# Patient Record
Sex: Female | Born: 1985 | Race: Black or African American | Hispanic: No | Marital: Married | Smoking: Never smoker
Health system: Southern US, Community
[De-identification: ages and names within clinical notes are randomized; demographics above are authoritative.]

## PROBLEM LIST (undated history)

## (undated) ENCOUNTER — Inpatient Hospital Stay (HOSPITAL_COMMUNITY): Payer: Self-pay

## (undated) DIAGNOSIS — D649 Anemia, unspecified: Secondary | ICD-10-CM

## (undated) DIAGNOSIS — Z970 Presence of artificial eye: Secondary | ICD-10-CM

## (undated) DIAGNOSIS — O24419 Gestational diabetes mellitus in pregnancy, unspecified control: Secondary | ICD-10-CM

## (undated) DIAGNOSIS — H269 Unspecified cataract: Secondary | ICD-10-CM

## (undated) DIAGNOSIS — J45909 Unspecified asthma, uncomplicated: Secondary | ICD-10-CM

## (undated) DIAGNOSIS — B977 Papillomavirus as the cause of diseases classified elsewhere: Secondary | ICD-10-CM

## (undated) DIAGNOSIS — H409 Unspecified glaucoma: Secondary | ICD-10-CM

## (undated) DIAGNOSIS — H548 Legal blindness, as defined in USA: Secondary | ICD-10-CM

## (undated) HISTORY — PX: DILATION AND CURETTAGE OF UTERUS: SHX78

## (undated) HISTORY — PX: EYE SURGERY: SHX253

## (undated) HISTORY — DX: Unspecified asthma, uncomplicated: J45.909

## (undated) HISTORY — DX: Gestational diabetes mellitus in pregnancy, unspecified control: O24.419

---

## 2005-10-20 ENCOUNTER — Inpatient Hospital Stay (HOSPITAL_COMMUNITY): Admission: AD | Admit: 2005-10-20 | Discharge: 2005-10-20 | Payer: Self-pay | Admitting: *Deleted

## 2005-10-30 ENCOUNTER — Inpatient Hospital Stay (HOSPITAL_COMMUNITY): Admission: RE | Admit: 2005-10-30 | Discharge: 2005-10-30 | Payer: Self-pay | Admitting: *Deleted

## 2005-11-14 ENCOUNTER — Inpatient Hospital Stay (HOSPITAL_COMMUNITY): Admission: AD | Admit: 2005-11-14 | Discharge: 2005-11-14 | Payer: Self-pay | Admitting: Obstetrics & Gynecology

## 2005-11-27 ENCOUNTER — Emergency Department (HOSPITAL_COMMUNITY): Admission: EM | Admit: 2005-11-27 | Discharge: 2005-11-27 | Payer: Self-pay | Admitting: Emergency Medicine

## 2005-12-24 ENCOUNTER — Ambulatory Visit (HOSPITAL_COMMUNITY): Admission: RE | Admit: 2005-12-24 | Discharge: 2005-12-24 | Payer: Self-pay | Admitting: *Deleted

## 2005-12-27 ENCOUNTER — Inpatient Hospital Stay (HOSPITAL_COMMUNITY): Admission: AD | Admit: 2005-12-27 | Discharge: 2005-12-27 | Payer: Self-pay | Admitting: *Deleted

## 2006-01-04 ENCOUNTER — Inpatient Hospital Stay (HOSPITAL_COMMUNITY): Admission: AD | Admit: 2006-01-04 | Discharge: 2006-01-04 | Payer: Self-pay | Admitting: Obstetrics & Gynecology

## 2006-01-08 ENCOUNTER — Other Ambulatory Visit: Admission: RE | Admit: 2006-01-08 | Discharge: 2006-01-08 | Payer: Self-pay | Admitting: Obstetrics and Gynecology

## 2006-02-20 ENCOUNTER — Inpatient Hospital Stay (HOSPITAL_COMMUNITY): Admission: AD | Admit: 2006-02-20 | Discharge: 2006-02-20 | Payer: Self-pay | Admitting: Obstetrics and Gynecology

## 2006-03-11 ENCOUNTER — Inpatient Hospital Stay (HOSPITAL_COMMUNITY): Admission: AD | Admit: 2006-03-11 | Discharge: 2006-03-11 | Payer: Self-pay | Admitting: Obstetrics and Gynecology

## 2007-10-12 ENCOUNTER — Emergency Department: Payer: Self-pay | Admitting: Emergency Medicine

## 2010-07-28 ENCOUNTER — Ambulatory Visit: Payer: Self-pay | Admitting: Family Medicine

## 2011-11-18 ENCOUNTER — Ambulatory Visit: Payer: Self-pay

## 2012-02-21 ENCOUNTER — Ambulatory Visit: Payer: Self-pay

## 2012-02-21 LAB — URINALYSIS, COMPLETE
Bacteria: NEGATIVE
Bilirubin,UR: NEGATIVE
Glucose,UR: NEGATIVE mg/dL (ref 0–75)
Ketone: NEGATIVE
Nitrite: NEGATIVE
Ph: 8 (ref 4.5–8.0)
Protein: NEGATIVE
Specific Gravity: 1.02 (ref 1.003–1.030)

## 2012-02-21 LAB — PREGNANCY, URINE: Pregnancy Test, Urine: NEGATIVE m[IU]/mL

## 2012-02-23 LAB — URINE CULTURE

## 2012-05-06 ENCOUNTER — Ambulatory Visit: Payer: Self-pay | Admitting: Family Medicine

## 2012-05-16 ENCOUNTER — Emergency Department: Payer: Self-pay | Admitting: Emergency Medicine

## 2012-05-16 LAB — URINALYSIS, COMPLETE
Bilirubin,UR: NEGATIVE
Glucose,UR: NEGATIVE mg/dL (ref 0–75)
Ketone: NEGATIVE
Leukocyte Esterase: NEGATIVE
Nitrite: NEGATIVE
Ph: 8 (ref 4.5–8.0)
Protein: NEGATIVE
RBC,UR: 4 /HPF (ref 0–5)
Specific Gravity: 1.011 (ref 1.003–1.030)
Squamous Epithelial: 1
WBC UR: <1 /HPF (ref 0–5)

## 2012-05-16 LAB — HCG, QUANTITATIVE, PREGNANCY: Beta Hcg, Quant.: 92317 m[IU]/mL — ABNORMAL HIGH

## 2012-05-16 LAB — CBC
HCT: 36.1 % (ref 35.0–47.0)
HGB: 12 g/dL (ref 12.0–16.0)
MCH: 29.7 pg (ref 26.0–34.0)
MCHC: 33.3 g/dL (ref 32.0–36.0)
MCV: 89 fL (ref 80–100)
Platelet: 277 10*3/uL (ref 150–440)
RBC: 4.05 10*6/uL (ref 3.80–5.20)
RDW: 13.6 % (ref 11.5–14.5)
WBC: 7.8 10*3/uL (ref 3.6–11.0)

## 2012-08-04 ENCOUNTER — Observation Stay: Payer: Self-pay

## 2012-08-04 LAB — URINALYSIS, COMPLETE
Bilirubin,UR: NEGATIVE
Glucose,UR: NEGATIVE mg/dL (ref 0–75)
Ketone: NEGATIVE
Leukocyte Esterase: NEGATIVE
Nitrite: NEGATIVE
Ph: 7 (ref 4.5–8.0)
Protein: NEGATIVE
RBC,UR: 29 /HPF (ref 0–5)
Specific Gravity: 1.017 (ref 1.003–1.030)
Squamous Epithelial: 3
WBC UR: 2 /HPF (ref 0–5)

## 2012-08-05 LAB — URINE CULTURE

## 2012-10-11 ENCOUNTER — Observation Stay: Payer: Self-pay | Admitting: Obstetrics and Gynecology

## 2012-10-11 LAB — URINALYSIS, COMPLETE
Bilirubin,UR: NEGATIVE
Glucose,UR: NEGATIVE mg/dL (ref 0–75)
Ketone: NEGATIVE
Leukocyte Esterase: NEGATIVE
Nitrite: NEGATIVE
Ph: 7 (ref 4.5–8.0)
Protein: NEGATIVE
RBC,UR: 4 /HPF (ref 0–5)
Specific Gravity: 1.005 (ref 1.003–1.030)
Squamous Epithelial: 1
WBC UR: 2 /HPF (ref 0–5)

## 2012-10-11 LAB — FETAL FIBRONECTIN
Appearance: NORMAL
Fetal Fibronectin: NEGATIVE

## 2012-11-01 ENCOUNTER — Observation Stay: Payer: Self-pay

## 2012-11-01 LAB — URINALYSIS, COMPLETE
Bilirubin,UR: NEGATIVE
Glucose,UR: NEGATIVE mg/dL (ref 0–75)
Leukocyte Esterase: NEGATIVE
Nitrite: NEGATIVE
Ph: 7 (ref 4.5–8.0)
Protein: NEGATIVE
RBC,UR: 5 /HPF (ref 0–5)
Specific Gravity: 1.008 (ref 1.003–1.030)
Squamous Epithelial: 3
Transitional Epi: <1
WBC UR: 2 /HPF (ref 0–5)

## 2012-11-02 LAB — URINE CULTURE

## 2012-12-02 ENCOUNTER — Observation Stay: Payer: Self-pay | Admitting: Obstetrics and Gynecology

## 2012-12-10 ENCOUNTER — Inpatient Hospital Stay: Payer: Self-pay

## 2012-12-10 LAB — CBC WITH DIFFERENTIAL/PLATELET
Basophil #: 0 10*3/uL (ref 0.0–0.1)
Basophil %: 0.3 %
Eosinophil #: 0.1 10*3/uL (ref 0.0–0.7)
Eosinophil %: 1.1 %
HCT: 32.6 % — ABNORMAL LOW (ref 35.0–47.0)
HGB: 10.5 g/dL — ABNORMAL LOW (ref 12.0–16.0)
Lymphocyte #: 1.3 10*3/uL (ref 1.0–3.6)
Lymphocyte %: 14.4 %
MCH: 27.8 pg (ref 26.0–34.0)
MCHC: 32.2 g/dL (ref 32.0–36.0)
MCV: 86 fL (ref 80–100)
Monocyte #: 0.9 x10 3/mm (ref 0.2–0.9)
Monocyte %: 9.8 %
Neutrophil #: 6.5 10*3/uL (ref 1.4–6.5)
Neutrophil %: 74.4 %
Platelet: 276 10*3/uL (ref 150–440)
RBC: 3.77 10*6/uL — ABNORMAL LOW (ref 3.80–5.20)
RDW: 16.2 % — ABNORMAL HIGH (ref 11.5–14.5)
WBC: 8.8 10*3/uL (ref 3.6–11.0)

## 2012-12-12 LAB — HEMATOCRIT: HCT: 29.7 % — ABNORMAL LOW (ref 35.0–47.0)

## 2013-02-07 ENCOUNTER — Emergency Department: Payer: Self-pay | Admitting: Emergency Medicine

## 2013-02-07 LAB — CBC
HCT: 38.9 % (ref 35.0–47.0)
HGB: 12.7 g/dL (ref 12.0–16.0)
MCH: 28.6 pg (ref 26.0–34.0)
MCHC: 32.8 g/dL (ref 32.0–36.0)
MCV: 87 fL (ref 80–100)
Platelet: 345 10*3/uL (ref 150–440)
RBC: 4.46 10*6/uL (ref 3.80–5.20)
RDW: 16.6 % — ABNORMAL HIGH (ref 11.5–14.5)
WBC: 8.1 10*3/uL (ref 3.6–11.0)

## 2013-02-07 LAB — COMPREHENSIVE METABOLIC PANEL
Albumin: 3.8 g/dL (ref 3.4–5.0)
Alkaline Phosphatase: 110 U/L (ref 50–136)
Anion Gap: 7 (ref 7–16)
BUN: 10 mg/dL (ref 7–18)
Bilirubin,Total: 0.2 mg/dL (ref 0.2–1.0)
Calcium, Total: 9.4 mg/dL (ref 8.5–10.1)
Chloride: 104 mmol/L (ref 98–107)
Co2: 30 mmol/L (ref 21–32)
Creatinine: 0.68 mg/dL (ref 0.60–1.30)
EGFR (African American): >60
EGFR (Non-African Amer.): >60
Glucose: 110 mg/dL — ABNORMAL HIGH (ref 65–99)
Osmolality: 281 (ref 275–301)
Potassium: 3.9 mmol/L (ref 3.5–5.1)
SGOT(AST): 21 U/L (ref 15–37)
SGPT (ALT): 30 U/L (ref 12–78)
Sodium: 141 mmol/L (ref 136–145)
Total Protein: 8.2 g/dL (ref 6.4–8.2)

## 2013-02-08 LAB — CK TOTAL AND CKMB (NOT AT ARMC)
CK, Total: 124 U/L (ref 21–215)
CK-MB: <0.5 ng/mL — ABNORMAL LOW (ref 0.5–3.6)

## 2013-02-08 LAB — TROPONIN I: Troponin-I: <0.02 ng/mL

## 2013-08-26 ENCOUNTER — Emergency Department: Payer: Self-pay | Admitting: Emergency Medicine

## 2013-08-26 LAB — CBC WITH DIFFERENTIAL/PLATELET
Basophil #: 0 10*3/uL (ref 0.0–0.1)
Basophil %: 0.4 %
Eosinophil #: 0.3 10*3/uL (ref 0.0–0.7)
Eosinophil %: 3.9 %
HCT: 37.7 % (ref 35.0–47.0)
HGB: 12.7 g/dL (ref 12.0–16.0)
Lymphocyte #: 2.2 10*3/uL (ref 1.0–3.6)
Lymphocyte %: 28.7 %
MCH: 29.4 pg (ref 26.0–34.0)
MCHC: 33.6 g/dL (ref 32.0–36.0)
MCV: 88 fL (ref 80–100)
Monocyte #: 0.6 x10 3/mm (ref 0.2–0.9)
Monocyte %: 8.1 %
Neutrophil #: 4.4 10*3/uL (ref 1.4–6.5)
Neutrophil %: 58.9 %
Platelet: 307 10*3/uL (ref 150–440)
RBC: 4.31 10*6/uL (ref 3.80–5.20)
RDW: 13.7 % (ref 11.5–14.5)
WBC: 7.5 10*3/uL (ref 3.6–11.0)

## 2013-08-26 LAB — COMPREHENSIVE METABOLIC PANEL
Albumin: 3.7 g/dL (ref 3.4–5.0)
Alkaline Phosphatase: 93 U/L (ref 50–136)
Anion Gap: 4 — ABNORMAL LOW (ref 7–16)
BUN: 16 mg/dL (ref 7–18)
Bilirubin,Total: 0.2 mg/dL (ref 0.2–1.0)
Calcium, Total: 9.3 mg/dL (ref 8.5–10.1)
Chloride: 103 mmol/L (ref 98–107)
Co2: 30 mmol/L (ref 21–32)
Creatinine: 0.72 mg/dL (ref 0.60–1.30)
EGFR (African American): >60
EGFR (Non-African Amer.): >60
Glucose: 96 mg/dL (ref 65–99)
Osmolality: 275 (ref 275–301)
Potassium: 3.7 mmol/L (ref 3.5–5.1)
SGOT(AST): 18 U/L (ref 15–37)
SGPT (ALT): 20 U/L (ref 12–78)
Sodium: 137 mmol/L (ref 136–145)
Total Protein: 7.8 g/dL (ref 6.4–8.2)

## 2013-08-26 LAB — URINALYSIS, COMPLETE
Bilirubin,UR: NEGATIVE
Glucose,UR: NEGATIVE mg/dL (ref 0–75)
Ketone: NEGATIVE
Nitrite: NEGATIVE
Ph: 7 (ref 4.5–8.0)
Protein: NEGATIVE
RBC,UR: 16 /HPF (ref 0–5)
Specific Gravity: 1.02 (ref 1.003–1.030)
Squamous Epithelial: 7
WBC UR: 2 /HPF (ref 0–5)

## 2013-08-26 LAB — TSH: Thyroid Stimulating Horm: 2.96 u[IU]/mL

## 2013-08-26 LAB — PREGNANCY, URINE: Pregnancy Test, Urine: NEGATIVE m[IU]/mL

## 2013-08-26 LAB — T4, FREE: Free Thyroxine: 1 ng/dL (ref 0.76–1.46)

## 2013-09-07 ENCOUNTER — Ambulatory Visit: Payer: Self-pay | Admitting: Family Medicine

## 2013-11-24 ENCOUNTER — Ambulatory Visit: Payer: Self-pay

## 2013-12-24 ENCOUNTER — Ambulatory Visit: Payer: Self-pay | Admitting: Physician Assistant

## 2013-12-24 LAB — COMPREHENSIVE METABOLIC PANEL
Albumin: 3.1 g/dL — ABNORMAL LOW (ref 3.4–5.0)
Alkaline Phosphatase: 47 U/L
Anion Gap: 12 (ref 7–16)
BUN: 7 mg/dL (ref 7–18)
Bilirubin,Total: 0.4 mg/dL (ref 0.2–1.0)
Calcium, Total: 9.3 mg/dL (ref 8.5–10.1)
Chloride: 100 mmol/L (ref 98–107)
Co2: 24 mmol/L (ref 21–32)
Creatinine: 0.56 mg/dL — ABNORMAL LOW (ref 0.60–1.30)
EGFR (African American): >60
EGFR (Non-African Amer.): >60
Glucose: 86 mg/dL (ref 65–99)
Osmolality: 269 (ref 275–301)
Potassium: 3.7 mmol/L (ref 3.5–5.1)
SGOT(AST): 13 U/L — ABNORMAL LOW (ref 15–37)
SGPT (ALT): 16 U/L (ref 12–78)
Sodium: 136 mmol/L (ref 136–145)
Total Protein: 7.8 g/dL (ref 6.4–8.2)

## 2013-12-24 LAB — URINALYSIS, COMPLETE
Bilirubin,UR: NEGATIVE
Glucose,UR: NEGATIVE mg/dL (ref 0–75)
Nitrite: NEGATIVE
Ph: 6.5 (ref 4.5–8.0)
Specific Gravity: 1.02 (ref 1.003–1.030)

## 2013-12-24 LAB — CBC WITH DIFFERENTIAL/PLATELET
Basophil #: 0 10*3/uL (ref 0.0–0.1)
Basophil %: 0.3 %
Eosinophil #: 0.1 10*3/uL (ref 0.0–0.7)
Eosinophil %: 1.4 %
HCT: 37.6 % (ref 35.0–47.0)
HGB: 12.6 g/dL (ref 12.0–16.0)
Lymphocyte #: 0.8 10*3/uL — ABNORMAL LOW (ref 1.0–3.6)
Lymphocyte %: 13.3 %
MCH: 29.6 pg (ref 26.0–34.0)
MCHC: 33.5 g/dL (ref 32.0–36.0)
MCV: 88 fL (ref 80–100)
Monocyte #: 0.4 x10 3/mm (ref 0.2–0.9)
Monocyte %: 7 %
Neutrophil #: 4.8 10*3/uL (ref 1.4–6.5)
Neutrophil %: 78 %
Platelet: 280 10*3/uL (ref 150–440)
RBC: 4.25 10*6/uL (ref 3.80–5.20)
RDW: 13.8 % (ref 11.5–14.5)
WBC: 6.2 10*3/uL (ref 3.6–11.0)

## 2013-12-26 LAB — URINE CULTURE

## 2014-04-29 ENCOUNTER — Observation Stay: Payer: Self-pay

## 2014-06-13 ENCOUNTER — Observation Stay: Payer: Self-pay | Admitting: Obstetrics and Gynecology

## 2014-06-13 LAB — URINALYSIS, COMPLETE
Bilirubin,UR: NEGATIVE
Glucose,UR: NEGATIVE mg/dL (ref 0–75)
Ketone: NEGATIVE
Nitrite: NEGATIVE
Ph: 7 (ref 4.5–8.0)
Protein: NEGATIVE
RBC,UR: 7 /HPF (ref 0–5)
Specific Gravity: 1.013 (ref 1.003–1.030)
Squamous Epithelial: 30
WBC UR: 17 /HPF (ref 0–5)

## 2014-06-25 ENCOUNTER — Observation Stay: Payer: Self-pay | Admitting: Obstetrics and Gynecology

## 2014-07-07 ENCOUNTER — Inpatient Hospital Stay: Payer: Self-pay

## 2014-07-07 LAB — CBC WITH DIFFERENTIAL/PLATELET
Basophil #: 0 10*3/uL (ref 0.0–0.1)
Basophil %: 0.4 %
Eosinophil #: 0.1 10*3/uL (ref 0.0–0.7)
Eosinophil %: 0.9 %
HCT: 36 % (ref 35.0–47.0)
HGB: 11.5 g/dL — ABNORMAL LOW (ref 12.0–16.0)
Lymphocyte #: 1.2 10*3/uL (ref 1.0–3.6)
Lymphocyte %: 14.2 %
MCH: 28.4 pg (ref 26.0–34.0)
MCHC: 32 g/dL (ref 32.0–36.0)
MCV: 89 fL (ref 80–100)
Monocyte #: 0.8 x10 3/mm (ref 0.2–0.9)
Monocyte %: 9.9 %
Neutrophil #: 6.3 10*3/uL (ref 1.4–6.5)
Neutrophil %: 74.6 %
Platelet: 230 10*3/uL (ref 150–440)
RBC: 4.06 10*6/uL (ref 3.80–5.20)
RDW: 16.2 % — ABNORMAL HIGH (ref 11.5–14.5)
WBC: 8.4 10*3/uL (ref 3.6–11.0)

## 2014-07-08 LAB — HEMATOCRIT: HCT: 31.5 % — ABNORMAL LOW (ref 35.0–47.0)

## 2015-04-30 NOTE — H&P (Signed)
L&D Evaluation:  History Expanded:   HPI 29yo at 31 weeks who was seen in office today and she now has pain in her back that started at 3pm she is not bleeding or leaking she has a history of kidney stones. no lab work has been forwarded to the hospital so labs are unknown and wi fi has been done for hiopsital per IT. no need for theses at gthis time,.    Gravida 1    Term 0    PreTerm 0    Abortion 0    Living 0    EDC 12-Dec-2012    Presents with back pain    Patient's Medical History No Chronic Illness    Patient's Surgical History none    Medications Pre [place]atal Vitamins    Social History none    Family History Non-Contributory   ROS:   ROS All systems were reviewed.  HEENT, CNS, GI, GU, Respiratory, CV, Renal and Musculoskeletal systems were found to be normal.   Exam:   Vital Signs stable    Urine Protein blood 2+,which is always there    General no apparent distress    Mental Status clear    Chest clear    Heart normal sinus rhythm   Impression:   Impression back piaj   Plan:   Comments having a few contractions on the monitor and she has blood in her urine which she always has, she will need to have IVF and FFn and then see if this changes,    Follow Up Appointment need to schedule   Electronic Signatures: Adria DevonKlett, Amine Adelson (MD)  (Signed 22-Oct-13 20:40)  Authored: L&D Evaluation   Last Updated: 22-Oct-13 20:40 by Adria DevonKlett, Donya Hitch (MD)

## 2015-04-30 NOTE — H&P (Signed)
L&D Evaluation:  History:   HPI 29 year old BF G4 P1021 with EDC=12/12/2012 by a 8wk4d ultrasound presents at 721 3/7 weeks with c/o right flank pain yesterday afternoon, that resolved until this AM at 0430 when she also began having urinary frequency. Reports having dysuria intermittently since yesterday. Denies fever, chills, contractions,or hematuria. Has not taken anything for pain or dysuria. Has had GBS bacteruria with this pregnancy and was treated with Macrobid. In June patient presented with UTI symptoms, and was treated for monilia and possible UTI with Diflucan and Amoxicillin, but urine culture was <10,000col/ml. She has a hx of frequent UTIs, urolithiasis , and microscopic hematuria. She [facility] her care at [facility] and [facility]. Had an early elevated one hr GTT, but the 3 hr GTT was normal.    Presents with dysuria, frequency, and right flank pain.    Patient's Medical History UTIs, urolithiasis, glaucoma, GDM with first pregnancy.    Patient's Surgical History right eye surgery 2004    Medications Pre [place]atal Vitamins  eye gtts    Allergies NKDA    Social History none    Family History Non-Contributory   ROS:   ROS see HPI   Exam:   Vital Signs stable  126/59    General no apparent distress    Mental Status clear    Abdomen uterus NT, mild SP tenderness    Back +R CVAT, tenderness in right SI area    Edema no edema    Mebranes Intact    FHT 150s    Ucx absent    Skin dry   Impression:   Impression IUP at 21 3/7 weeks with right flank pain, dysuria, and frequency. R/O pyelo   Plan:   Plan UA, urine C&S, po fluids, contraction monitor   Electronic Signatures: Trinna BalloonGutierrez, Kelee Cunningham L (CNM)  (Signed 15-Aug-13 07:54)  Authored: L&D Evaluation   Last Updated: 15-Aug-13 07:54 by Trinna BalloonGutierrez, Gaylen Pereira L (CNM)

## 2015-04-30 NOTE — H&P (Signed)
L&D Evaluation:  History Expanded:   HPI 29 year old BF G4 P1021 with EDC=12/12/2012 by a 8wk4d ultrasound presents at 9139 5/7  weeks with c/o mild contractions and prior pregnancy h/o Induction of labor for oligohydramnios.  She started her care at [facility] and [facility] in her ealy second trimester. Had an early elevated one hr GTT, but the 3 hr GTT was normal. A repeat 3hr GTT at 28 weeks was done with only one elevated value (2hr). Hx of an SVD at 38 weeks after having threatened preterm labor. Her labor was induced for oligo.    Presents with contractions    Patient's Medical History UTIs, urolithiasis, glaucoma, GDM with first pregnancy.    Patient's Surgical History right eye surgery 2004    Medications Pre [place]atal Vitamins  eye gtts    Allergies NKDA    Social History none    Family History Non-Contributory   ROS:   ROS see HPI   Exam:   Vital Signs stable    General no apparent distress    Mental Status clear    Chest clear    Heart normal sinus rhythm    Abdomen gravid, tender with contractions, cephalic    Estimated Fetal Weight Average for gestational age    Fetal Position cephalic    Back no CVAT    Edema no edema    Pelvic no external lesions, 1-2/80    Mebranes Intact    FHT normal rate with no decels    Ucx irregular    Skin dry    Other YS- Vtx. AFI <3.   Impression:   Impression 39 5/7 weeks Oligo and early labor.  Needs Augmentation if labor does not progress.   Plan:   Plan EFM/NST, monitor contractions and for cervical change, fluids    Comments Risks of oligohydramnios discussed with patient.   Electronic Signatures: Sylvia Thompson, Robert Paul (MD)  (Signed 21-Dec-13 22:46)  Authored: L&D Evaluation   Last Updated: 21-Dec-13 22:46 by Sylvia Thompson, Robert Paul (MD)

## 2015-04-30 NOTE — H&P (Signed)
L&D Evaluation:  History:   HPI 29 year old BF G4 P1021 with EDC=12/12/2012 by a 8wk4d ultrasound presents at 6234 1/7  weeks with c/o the "baby balling up" frequently since the weekend and dysuria x 2 days. She felt the contractions over the weekend, but they got harder and more frequent last night. The contractions decreased after a shower this AM but still came q6-7 minutes. She was seen for flank pain and dysuria about 2-3 weeks ago and the dysuria resolved after treating pt for monilia, but the dysuria returned 2 days ago. She had a gush of fluid today around 11AM and saw a streak of blood on the toliet tissue after that. Denies fever, chills, back pain. Baby active.Has had GBS bacteruria with this pregnancy. In June patient presented with UTI symptoms, and was treated for monilia and possible UTI with Diflucan and Amoxicillin, but urine culture was <10,000col/ml. She has a hx of frequent UTIs, urolithiasis , and microscopic hematuria. She was seen in August and Oct in L&D with flank pain/urolithiasis symptoms. She started her care at [facility] and [facility]. Had an early elevated one hr GTT, but the 3 hr GTT was normal. A repeat 3hr GTT at 28 weeks was done with only one elevated value (2hr). Hx of an SVD at 38 weeks after having threatened preterm labor. Her labor was induced for oligo.    Presents with contractions, dysuria, leaking fluid    Patient's Medical History UTIs, urolithiasis, glaucoma, GDM with first pregnancy.    Patient's Surgical History right eye surgery 2004    Medications Pre [place]atal Vitamins  eye gtts    Allergies NKDA    Social History none    Family History Non-Contributory   ROS:   ROS see HPI   Exam:   Vital Signs stable    Urine Protein negative dipstick, UA: hazy, 1.008, 5RBC/HPF, 2WBC/HPF, +1 bacteria, 3epis    General no apparent distress    Abdomen gravid, tender with contractions, cephalic. mild contraction  palpated    Fetal Position cephalic    Pelvic no external lesions, wet prep negative. SSE: negative pooling, fern, or Nitrazine. cx: closed/70%/-2    Mebranes Intact    FHT normal rate with no decels    Ucx q4-5 min apart    Skin dry   Impression:   Impression IUP at 34 1/7 weeks with threatened preterm labor. No  evidence of SROM.Marland Kitchen. Possible UTI   Plan:   Plan monitor contractions and for cervical change, urine culture. Start Keflex 500 mgm tid. One dose terbutaline subcutaneously   Electronic Signatures: Trinna BalloonGutierrez, Atlantis Delong L (CNM)  (Signed [id number]-Nov-13 15:04)  Authored: L&D Evaluation   Last Updated: 12-Nov-13 15:04 by Trinna BalloonGutierrez, Saleha Kalp L (CNM)

## 2015-04-30 NOTE — H&P (Signed)
L&D Evaluation:  History:  HPI 29 y/o G5P2022 @ 40/3days EDC 07/04/14 arrives with regualr conractions beginning this am and mucusy discharge. Denies leakign flid or bloody show. Baby is active. Care @ [facility] HX abn pap, GDM 1st pregnancy. GBS+.   Presents with contractions   Patient's Medical History No Chronic Illness   Patient's Surgical History right eye surgery 2004   Medications Pre [place] Vitamins   Allergies NKDA   Social History none   Family History Non-Contributory   ROS:  ROS All systems were reviewed.  HEENT, CNS, GI, GU, Respiratory, CV, Renal and Musculoskeletal systems were found to be normal.   Exam:  Vital Signs stable   Urine Protein not completed   General no apparent distress   Mental Status clear   Chest clear   Heart normal sinus rhythm   Abdomen gravid, non-tender   Estimated Fetal Weight Average for gestational age   Fetal Position vtx   Fundal Height term   Back no CVAT   Edema 1+   Reflexes 1+   Clonus negative   Pelvic no external lesions, 5cm vtx @ -1 BBOW sm show   Mebranes Intact   FHT normal rate with no decels, baseline 130's 140's avg variability with accels   Fetal Heart Rate 144   Ucx irregular, Q 3/5 mins 60 sec moderate   Skin dry   Lymph no lymphadenopathy   Impression:  Impression early labor   Plan:  Plan EFM/NST, monitor contractions and for cervical change, antibiotics for GBBS prophylaxis   Comments Admitted, knows what to expect. IV ABX begun. Plans epidural with labor progress. Family at bedside, supportive.   Electronic Signatures: Albertina ParrLugiano, Felecity Lemaster B (CNM)  (Signed 18-Jul-15 13:37)  Authored: L&D Evaluation   Last Updated: 18-Jul-15 13:37 by Albertina ParrLugiano, Christofer Shen B (CNM)

## 2015-09-29 ENCOUNTER — Emergency Department (HOSPITAL_COMMUNITY)
Admission: EM | Admit: 2015-09-29 | Discharge: 2015-09-29 | Disposition: A | Discharge status: Home / Self Care | Payer: Medicaid Other | Attending: Emergency Medicine | Admitting: Emergency Medicine

## 2015-09-29 ENCOUNTER — Encounter (HOSPITAL_COMMUNITY): Payer: Self-pay | Admitting: Emergency Medicine

## 2015-09-29 DIAGNOSIS — Z79899 Other long term (current) drug therapy: Secondary | ICD-10-CM | POA: Diagnosis not present

## 2015-09-29 DIAGNOSIS — H59092 Other disorders of the left eye following cataract surgery: Secondary | ICD-10-CM | POA: Diagnosis not present

## 2015-09-29 DIAGNOSIS — H11432 Conjunctival hyperemia, left eye: Secondary | ICD-10-CM | POA: Insufficient documentation

## 2015-09-29 DIAGNOSIS — H538 Other visual disturbances: Secondary | ICD-10-CM | POA: Insufficient documentation

## 2015-09-29 DIAGNOSIS — H548 Legal blindness, as defined in USA: Secondary | ICD-10-CM | POA: Diagnosis not present

## 2015-09-29 DIAGNOSIS — H1132 Conjunctival hemorrhage, left eye: Secondary | ICD-10-CM | POA: Diagnosis present

## 2015-09-29 DIAGNOSIS — Z9842 Cataract extraction status, left eye: Secondary | ICD-10-CM | POA: Insufficient documentation

## 2015-09-29 DIAGNOSIS — H40052 Ocular hypertension, left eye: Secondary | ICD-10-CM

## 2015-09-29 DIAGNOSIS — H4089 Other specified glaucoma: Secondary | ICD-10-CM | POA: Diagnosis not present

## 2015-09-29 HISTORY — DX: Unspecified glaucoma: H40.9

## 2015-09-29 HISTORY — DX: Legal blindness, as defined in USA: H54.8

## 2015-09-29 HISTORY — DX: Unspecified cataract: H26.9

## 2015-09-29 HISTORY — DX: Presence of artificial eye: Z97.0

## 2015-09-29 MED ORDER — TETRACAINE HCL 0.5 % OP SOLN
2.0000 [drp] | Freq: Once | OPHTHALMIC | Status: AC
  Administered 2015-09-29: 2 [drp] via OPHTHALMIC
  Filled 2015-09-29: qty 2

## 2015-09-29 MED ORDER — LATANOPROST 0.005 % OP SOLN
1.0000 [drp] | Freq: Every day | OPHTHALMIC | Status: DC
  Administered 2015-09-29: 1 [drp] via OPHTHALMIC
  Filled 2015-09-29: qty 2.5

## 2015-09-29 MED ORDER — FLUORESCEIN SODIUM 1 MG OP STRP
1.0000 | ORAL_STRIP | Freq: Once | OPHTHALMIC | Status: AC
  Administered 2015-09-29: 1 via OPHTHALMIC
  Filled 2015-09-29: qty 1

## 2015-09-29 NOTE — Discharge Instructions (Signed)
1. Medications: Add Xalatan 1 drop in the left eye at bedtime every day to your eye drop regimen, usual home medications 2. Treatment: rest, drink plenty of fluids,  3. Follow Up: Please followup with [facility] for a pressure check in 2 days; if you are unable to do so, please follow-up with Dr. Randon Goldsmith at [facility]; Please return to the ER for changes in vision, pain in your eye, fevers or other concerns

## 2015-09-29 NOTE — ED Provider Notes (Signed)
CSN: [id number]     Arrival date & time 09/29/15  1742 History  By signing my name below, I, Phillis Haggis, attest that this documentation has been prepared under the direction and in the presence of Dierdre Forth,, PA-C. Electronically Signed: Phillis Haggis, ED Scribe. 09/29/2015. 6:52 PM.   Chief Complaint  Patient presents with  . Eye Problem   The history is provided by the patient. No language interpreter was used.  HPI Comments: Sylvia Thompson is a 29 y.o. 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 She states that it feels like her eye pressure is up, for which she uses eye drops. The pressure and blurred vision are not new per the patient, just the blood in her eye. Pt reports she has has no changes in her vision today.  She states that she was followed at [facility], but has been referred to [facility]. She report hx of similar symptoms when she had right eye surgery years ago. She denies pain in eye, new photophobia, having a BM, sneezing, or coughing today. Pt wears eyeglasses.   Pt reports he baseline IOP is 18 in the left eye.    Past Medical History  Diagnosis Date  . Glaucoma   . Cataract   . Prosthetic eye globe   . Blindness, legal    Past Surgical History  Procedure Laterality Date  . Eye surgery      prosthetic right eye  . Dilation and curettage of uterus     No family history on file. Social History  Substance Use Topics  . Smoking status: Never Smoker   . Smokeless tobacco: None  . Alcohol Use: Yes   OB History    No data available     Review of Systems  Constitutional: Negative for fever, diaphoresis, appetite change, fatigue and unexpected weight change.  HENT: Negative for mouth sores.   Eyes: Positive for redness and visual disturbance ( baseline). Negative for  photophobia and pain.  Respiratory: Negative for cough, chest tightness, shortness of breath and wheezing.   Cardiovascular: Negative for chest pain.  Gastrointestinal: Negative for nausea, vomiting, abdominal pain, diarrhea and constipation.  Endocrine: Negative for polydipsia, polyphagia and polyuria.  Genitourinary: Negative for dysuria, urgency, frequency and hematuria.  Musculoskeletal: Negative for back pain and neck stiffness.  Skin: Negative for rash.  Allergic/Immunologic: Negative for immunocompromised state.  Neurological: Negative for syncope, light-headedness and headaches.  Hematological: Does not bruise/bleed easily.  Psychiatric/Behavioral: Negative for sleep disturbance. The patient is not nervous/anxious.    Allergies  Review of patient's allergies indicates no known allergies.  Home Medications   Prior to Admission medications   Medication Sig Start Date End Date Taking? Authorizing Provider  brimonidine (ALPHAGAN P) 0.1 % SOLN Place 1 drop into the left eye 2 (two) times daily.   Yes Historical Provider, MD  Brinzolamide-Brimonidine [facility]) Place 1 drop into the left eye at bedtime.   Yes Historical Provider, MD  dorzolamide (TRUSOPT) 2 % ophthalmic solution Place 1 drop into both eyes 2 (two) times daily.   Yes Historical Provider, MD   BP 134/64 mmHg  Pulse 87  Temp(Src) 98 F (36.7 C) (Oral)  Resp 16  Ht  (1.6 m)  Wt 166 lb 1.6 oz (75.342 kg)  BMI 29.43 kg/m2  SpO2 99%  LMP 09/03/2015  Physical Exam  Constitutional: She is oriented to person, place, and time. She appears well-developed and well-nourished. No distress.  HENT:  Head: Normocephalic and atraumatic.  Nose: Nose normal. No mucosal edema or rhinorrhea.  Mouth/Throat: Uvula is midline, oropharynx is clear and moist and mucous membranes are normal. No uvula swelling. No oropharyngeal exudate, posterior oropharyngeal edema, posterior oropharyngeal erythema or tonsillar abscesses.  Eyes:  Lids are normal. Lids are everted and swept, no foreign bodies found. Right eye exhibits no discharge. Left eye exhibits no chemosis, no discharge and no exudate. No foreign body present in the left eye. Left conjunctiva is not injected. Left conjunctiva has a hemorrhage. Left eye exhibits normal extraocular motion and no nystagmus. Left pupil is round and reactive.  Slit lamp exam:      The right eye shows no corneal abrasion, no corneal flare, no corneal ulcer, no foreign body, no fluorescein uptake and no anterior chamber bulge.       The left eye shows no corneal abrasion, no corneal flare, no corneal ulcer, no foreign body, no fluorescein uptake and no anterior chamber bulge.  Left pupil round and reactive to light - right eye is prosthetic No vertical, horizontal or rotational nystagmus No visible foreign body No corneal flare, ulcer or dendritic staining  No herpetic lesions to the face or around the eye Cornea is clear No injection of the conjunctiva Subconjunctival hemorrhage noted in the upper medial quadrant  Tono: L eye - 26  Visual Acuity: pt reports she is legally blind in the left eye and cannot complete the eye chart exam - patient is able to see hand movements and count fingers in her central vision - she reports this is baseline  Neck: Normal range of motion.  Full range of motion without pain No midline or paraspinal tenderness No nuchal rigidity; no meningeal signs  Cardiovascular: Normal rate, regular rhythm, normal heart sounds and intact distal pulses.   No murmur heard. Pulmonary/Chest: Effort normal. No respiratory distress.  Musculoskeletal: Normal range of motion.  Neurological: She is alert and oriented to person, place, and time.  Mental Status:  Alert, oriented, thought content appropriate. Speech fluent without evidence of aphasia.  Cranial Nerves:  III,IV, VI: ptosis not present, extra-ocular motions intact bilaterally  V,VII: smile symmetric, facial  light touch sensation equal VIII: hearing grossly normal bilaterally  IX,X: midline uvula rise  XI: bilateral shoulder shrug equal and strong XII: midline tongue extension   Skin: Skin is warm and dry. She is not diaphoretic. No erythema.  Psychiatric: She has a normal mood and affect.  Nursing note and vitals reviewed.   ED Course  Procedures (including critical care time) DIAGNOSTIC STUDIES: Oxygen Saturation is 99% on RA, normal by my interpretation.    COORDINATION OF CARE: 6:52 PM-Discussed treatment plan which includes eye exam with pt at bedside and pt agreed to plan.   Labs Review Labs Reviewed - No data to display  Imaging Review No results found. I have personally reviewed and evaluated these images and lab results as part of my medical decision-making.   EKG Interpretation None      MDM   Final diagnoses:  Subconjunctival hemorrhage of left eye  Increased intraocular pressure, left   Sanjuana Kava Presents with subconjunctival hemorrhage. She has a long history of congenital glaucoma.  On exam patient is without cloudy or steamy cornea, nausea/vomiting, or pain in the eye to suggest acute angle glaucoma. Her pupil is round and reactive to  light.  On exam she does have a subconjunctival hemorrhage.  Her intraocular pressure is elevated to 26 from her baseline of 18. Will consult with ophthalmology.  8:04 PM Pt discussed with Dr. Randon Goldsmith of [facility].  Based on her hx, he is comfortable with an IOP of 26.  He recommends adding Xalatan QHS with the first drop prior to discharge.  Pt is to follow-up with [facility] where she has been followed for the last 4 mos or with [facility]mology if she is unable to see her regular physician for a pressure check this week.  She remains without pain or vision changes.     I personally performed the services described in this documentation, which was scribed in my presence. The recorded  information has been reviewed and is accurate.   Dahlia Client Nicolae Vasek, PA-C 09/29/15 2027  Laurence Spates, MD 10/01/15 [phone]

## 2015-09-29 NOTE — ED Notes (Signed)
Pt c/o redness to left eye noted 15 mins PTA. Pt reports history of glaucoma and cataract issues. Pt reports that her vision is normally blurred but is increased.

## 2017-02-09 DIAGNOSIS — H401133 Primary open-angle glaucoma, bilateral, severe stage: Secondary | ICD-10-CM | POA: Diagnosis not present

## 2017-03-05 DIAGNOSIS — H401123 Primary open-angle glaucoma, left eye, severe stage: Secondary | ICD-10-CM | POA: Diagnosis not present

## 2017-05-24 DIAGNOSIS — H401193 Primary open-angle glaucoma, unspecified eye, severe stage: Secondary | ICD-10-CM | POA: Diagnosis not present

## 2017-06-16 ENCOUNTER — Encounter (INDEPENDENT_AMBULATORY_CARE_PROVIDER_SITE_OTHER): Payer: Self-pay | Admitting: Physician Assistant

## 2017-06-16 ENCOUNTER — Ambulatory Visit (INDEPENDENT_AMBULATORY_CARE_PROVIDER_SITE_OTHER): Payer: Medicare Other | Admitting: Physician Assistant

## 2017-06-16 VITALS — BP 132/84 | HR 78 | Temp 97.8°F | Ht 59.5 in | Wt 146.6 lb

## 2017-06-16 DIAGNOSIS — L309 Dermatitis, unspecified: Secondary | ICD-10-CM | POA: Diagnosis not present

## 2017-06-16 DIAGNOSIS — R51 Headache: Secondary | ICD-10-CM | POA: Diagnosis not present

## 2017-06-16 DIAGNOSIS — R519 Headache, unspecified: Secondary | ICD-10-CM

## 2017-06-16 MED ORDER — TRIAMCINOLONE ACETONIDE 0.5 % EX CREA: 1.0000 "application " | TOPICAL_CREAM | Freq: Two times a day (BID) | CUTANEOUS | 0 refills | Status: AC

## 2017-06-16 MED ORDER — IBUPROFEN 600 MG PO TABS: 600.0000 mg | ORAL_TABLET | Freq: Three times a day (TID) | ORAL | 0 refills | Status: DC | PRN

## 2017-06-16 NOTE — Patient Instructions (Signed)
Hand Dermatitis  Hand dermatitis is a skin condition that causes small, itchy, raised dots or fluid-filled blisters to form over the palms of the hands. This condition may also be called hand eczema.  What are the causes?  The cause of this condition is not known.  What increases the risk?  This condition is more likely to develop in people who have a history of allergies, such as:   Hay fever.   Allergic asthma.   An allergy to latex.    Chemical exposure, injuries, and environmental irritants can make hand dermatitis worse. Washing your hands too often can remove natural oils, which can dry out the skin and contribute to outbreaks of this condition.  What are the signs or symptoms?  The most common symptom of this condition is intense itchiness. Cracks or grooves (fissures) on the fingers can also develop. Affected areas can be painful, especially areas where large blisters have formed.  How is this diagnosed?  This condition is diagnosed with a medical history and physical exam.  How is this treated?  This condition is treated with medicines, including:   Steroid creams and ointments.   Oral steroid medicines.   Antibiotic medicines. These are prescribed if you have an infection.   Antihistamine medicines. These help to reduce itchiness.    Follow these instructions at home:   Take or apply over-the-counter and prescription medicines only as told by your health care provider.   If you were prescribed an antibiotic medicine, use it as told by your health care provider. Do not stop using the antibiotic even if you start to feel better.   Avoid washing your hands more often than necessary.   Avoid using harsh chemicals on your hands.   Wear protective gloves when you handle products that can irritate your skin.   Keep all follow-up visits as told by your health care provider. This is important.  Contact a health care provider if:   Your rash does not improve during the first week of treatment.   Your  rash is red or tender.   Your rash has pus coming from it.   Your rash spreads.  This information is not intended to replace advice given to you by your health care provider. Make sure you discuss any questions you have with your health care provider.  Document Released: 12/07/2005 Document Revised: 05/14/2016 Document Reviewed: 06/21/2015  [employer]vier Interactive Patient Education  2018 [employer]

## 2017-06-16 NOTE — Progress Notes (Signed)
Subjective:  Patient ID: Sylvia Thompson, female    DOB: 10/22/1986  Age: 31 y.o. MRN: [id number]  CC: rash on hands and headache  HPI Sylvia Thompson is a 31 y.o. female with a PMH of Glaucoma presents with bilateral hand rash and headache. Headache is frontal and waxing and waning over two months. Associated with photophobia and phonophobia. Does not take any medication for relief. Has to lay down and sleep before headache is resolved. Denies CP, palpitations, SOB, abdominal pain, f/c/n/v, or GI/GU sxs.    Rash on hands for approximatley two months. Associated with pruritis. Relieved with hydrocortisone. Does not endorse pain, swelling, suppuration, or bleeding.    Outpatient Medications Prior to Visit  Medication Sig Dispense Refill  . brimonidine (ALPHAGAN P) 0.1 % SOLN Place 1 drop into the left eye 2 (two) times daily.    . Brinzolamide-Brimonidine (SIMBRINZA OP) Place 1 drop into the left eye at bedtime.    . dorzolamide (TRUSOPT) 2 % ophthalmic solution Place 1 drop into both eyes 2 (two) times daily.     No facility-administered medications prior to visit.      ROS Review of Systems  Constitutional: Negative for chills, fever and malaise/fatigue.  Eyes: Negative for blurred vision.  Respiratory: Negative for shortness of breath.   Cardiovascular: Negative for chest pain and palpitations.  Gastrointestinal: Negative for abdominal pain and nausea.  Genitourinary: Negative for dysuria and hematuria.  Musculoskeletal: Negative for joint pain and myalgias.  Skin: Positive for rash.  Neurological: Positive for headaches. Negative for tingling.  Psychiatric/Behavioral: Negative for depression. The patient is not nervous/anxious.     Objective:  BP 132/84 (BP Location: Left Arm, Patient Position: Sitting, Cuff Size: Normal)   Pulse 78   Temp 97.8 F (36.6 C) (Oral)   Ht 4' 11.5" (1.511 m)   Wt 146 lb 9.6 oz (66.5 kg)   LMP 06/12/2017 (Exact Date)   SpO2 100%   BMI 29.11  kg/m   BP/Weight 06/16/2017 09/29/2015  Systolic BP 132 127  Diastolic BP 84 81  Wt. (Lbs) 146.6 166.1  BMI 29.11 29.43      Physical Exam  Constitutional: She is oriented to person, place, and time.  Well developed, well nourished, NAD, polite  HENT:  Head: Normocephalic and atraumatic.  Eyes: No scleral icterus.  Neck: Normal range of motion. Neck supple. No thyromegaly present.  Cardiovascular: Normal rate, regular rhythm and normal heart sounds.   Pulmonary/Chest: Effort normal and breath sounds normal.  Musculoskeletal: She exhibits no edema.  Neurological: She is alert and oriented to person, place, and time.  Skin: Skin is warm and dry. No rash noted. No erythema. No pallor.  Mild desquamation sporadically around palms and with a patch of hyperpigmentation in the proximal palm and in between interdigital webbing. No erythema, edema, suppuration, or bleeding.  Psychiatric: She has a normal mood and affect. Her behavior is normal. Thought content normal.  Vitals reviewed.    Assessment & Plan:   1. Eczema of both hands - Begin triamcinolone cream (KENALOG) 0.5 %; Apply 1 application topically 2 (two) times daily. Do not use for more than 7 to 10 consecutive days without medical approval.  Dispense: 30 g; Refill: 0  2. Nonintractable headache, unspecified chronicity pattern, unspecified headache type - Begin ibuprofen (ADVIL,MOTRIN) 600 MG tablet; Take 1 tablet (600 mg total) by mouth every 8 (eight) hours as needed.  Dispense: 30 tablet; Refill: 0 - CBC with Differential - Comprehensive metabolic panel  Meds ordered this encounter  Medications  . ibuprofen (ADVIL,MOTRIN) 600 MG tablet    Sig: Take 1 tablet (600 mg total) by mouth every 8 (eight) hours as needed.    Dispense:  30 tablet    Refill:  0    Order Specific Question:   Supervising Provider    Answer:   Quentin AngstJEGEDE, OLUGBEMIGA E [id number]]  . triamcinolone cream (KENALOG) 0.5 %    Sig: Apply 1 application  topically 2 (two) times daily. Do not use for more than 7 to 10 consecutive days without medical approval.    Dispense:  30 g    Refill:  0    Order Specific Question:   Supervising Provider    Answer:   Quentin AngstJEGEDE, OLUGBEMIGA E [[id number]]    Follow-up: Return if symptoms worsen or fail to improve.   Loletta Specteroger David Nizhoni Parlow PA

## 2017-06-17 ENCOUNTER — Telehealth (INDEPENDENT_AMBULATORY_CARE_PROVIDER_SITE_OTHER): Payer: Self-pay

## 2017-06-17 LAB — CBC WITH DIFFERENTIAL/PLATELET
Basophils Absolute: 0 10*3/uL (ref 0.0–0.2)
Basos: 0 %
EOS (ABSOLUTE): 0.2 10*3/uL (ref 0.0–0.4)
Eos: 4 %
Hematocrit: 37.7 % (ref 34.0–46.6)
Hemoglobin: 12.5 g/dL (ref 11.1–15.9)
Immature Grans (Abs): 0 10*3/uL (ref 0.0–0.1)
Immature Granulocytes: 0 %
LYMPHS ABS: 1.3 10*3/uL (ref 0.7–3.1)
Lymphs: 28 %
MCH: 29.8 pg (ref 26.6–33.0)
MCHC: 33.2 g/dL (ref 31.5–35.7)
MCV: 90 fL (ref 79–97)
MONOS ABS: 0.3 10*3/uL (ref 0.1–0.9)
Monocytes: 7 %
Neutrophils Absolute: 2.8 10*3/uL (ref 1.4–7.0)
Neutrophils: 61 %
Platelets: 328 10*3/uL (ref 150–379)
RBC: 4.19 x10E6/uL (ref 3.77–5.28)
RDW: 14.4 % (ref 12.3–15.4)
WBC: 4.6 10*3/uL (ref 3.4–10.8)

## 2017-06-17 LAB — COMPREHENSIVE METABOLIC PANEL
ALK PHOS: 57 IU/L (ref 39–117)
ALT: 9 IU/L (ref 0–32)
AST: 14 IU/L (ref 0–40)
Albumin/Globulin Ratio: 1.2 (ref 1.2–2.2)
Albumin: 4 g/dL (ref 3.5–5.5)
BUN/Creatinine Ratio: 17 (ref 9–23)
BUN: 12 mg/dL (ref 6–20)
Bilirubin Total: 0.2 mg/dL (ref 0.0–1.2)
CHLORIDE: 102 mmol/L (ref 96–106)
CO2: 24 mmol/L (ref 20–29)
CREATININE: 0.71 mg/dL (ref 0.57–1.00)
Calcium: 9.2 mg/dL (ref 8.7–10.2)
GFR calc Af Amer: 130 mL/min/{1.73_m2} (ref >59)
GFR calc non Af Amer: 113 mL/min/{1.73_m2} (ref >59)
GLUCOSE: 98 mg/dL (ref 65–99)
Globulin, Total: 3.4 g/dL (ref 1.5–4.5)
Potassium: 3.9 mmol/L (ref 3.5–5.2)
SODIUM: 141 mmol/L (ref 134–144)
Total Protein: 7.4 g/dL (ref 6.0–8.5)

## 2017-06-17 NOTE — Telephone Encounter (Signed)
Informed patient that results were normal. Maryjean Mornempestt S Roberts, CMA

## 2017-07-05 ENCOUNTER — Telehealth: Payer: Self-pay | Admitting: Physician Assistant

## 2017-07-05 NOTE — Telephone Encounter (Signed)
Pt called to [facility] an [facility] since she has an appt with PA Lily KocherGomez, shwe want a female to do the procedure, she was inform that will contact supervisor to see what we can do, email was sent to carmen waiting for respond.

## 2017-07-07 ENCOUNTER — Ambulatory Visit (INDEPENDENT_AMBULATORY_CARE_PROVIDER_SITE_OTHER): Payer: Medicare Other | Admitting: Physician Assistant

## 2017-07-08 ENCOUNTER — Encounter: Payer: Self-pay | Admitting: Family Medicine

## 2017-07-08 ENCOUNTER — Other Ambulatory Visit (HOSPITAL_COMMUNITY)
Admission: RE | Admit: 2017-07-08 | Discharge: 2017-07-08 | Disposition: A | Discharge status: Home / Self Care | Payer: Medicare Other | Source: Ambulatory Visit | Attending: Family Medicine | Admitting: Family Medicine

## 2017-07-08 ENCOUNTER — Ambulatory Visit: Payer: Medicare Other | Attending: Family Medicine | Admitting: Family Medicine

## 2017-07-08 VITALS — BP 132/80 | HR 74 | Temp 98.8°F | Resp 18 | Ht 60.0 in | Wt 142.8 lb

## 2017-07-08 DIAGNOSIS — Z87898 Personal history of other specified conditions: Secondary | ICD-10-CM | POA: Insufficient documentation

## 2017-07-08 DIAGNOSIS — Z79899 Other long term (current) drug therapy: Secondary | ICD-10-CM | POA: Diagnosis not present

## 2017-07-08 DIAGNOSIS — B9689 Other specified bacterial agents as the cause of diseases classified elsewhere: Secondary | ICD-10-CM | POA: Insufficient documentation

## 2017-07-08 DIAGNOSIS — Z113 Encounter for screening for infections with a predominantly sexual mode of transmission: Secondary | ICD-10-CM | POA: Diagnosis not present

## 2017-07-08 DIAGNOSIS — Z124 Encounter for screening for malignant neoplasm of cervix: Secondary | ICD-10-CM | POA: Insufficient documentation

## 2017-07-08 DIAGNOSIS — Z01419 Encounter for gynecological examination (general) (routine) without abnormal findings: Secondary | ICD-10-CM | POA: Insufficient documentation

## 2017-07-08 DIAGNOSIS — N898 Other specified noninflammatory disorders of vagina: Secondary | ICD-10-CM | POA: Diagnosis not present

## 2017-07-08 DIAGNOSIS — I10 Essential (primary) hypertension: Secondary | ICD-10-CM | POA: Diagnosis not present

## 2017-07-08 MED ORDER — HYDROCHLOROTHIAZIDE 12.5 MG PO TABS: 12.5000 mg | ORAL_TABLET | Freq: Every day | ORAL | 2 refills | Status: DC

## 2017-07-08 MED ORDER — METRONIDAZOLE 500 MG PO TABS: 500.0000 mg | ORAL_TABLET | Freq: Two times a day (BID) | ORAL | 0 refills | Status: DC

## 2017-07-08 NOTE — Progress Notes (Signed)
Patient has eaten  Patient has taken her medication

## 2017-07-08 NOTE — Progress Notes (Signed)
Subjective:  Patient ID: Sylvia Thompson, female    DOB: 05/14/1986  Age: 31 y.o. MRN: [id number]  CC: Gynecologic Exam   HPI Sylvia Thompson presents for well woman visit with gynecological examination. She denies any family history or breast or gynecological cancers. She denies any lumps,denting or dimpling of the breast.She does report history of clear nipple discharge 1 month ago near menstrual cycle. No recent history of breast feeding. She does not perform monthly SBE. She is a non-smoker.She requestsSTI testing with examination. She does report  vaginal discharge, white in appearance, non-odorous. She denies any vaginal lesions or dysuria. Reports 1 sexual partner within the last 3 months.        Outpatient Medications Prior to Visit  Medication Sig Dispense Refill  . brimonidine (ALPHAGAN P) 0.1 % SOLN Place 1 drop into the left eye 2 (two) times daily.    . Brinzolamide-Brimonidine (SIMBRINZA OP) Place 1 drop into the left eye at bedtime.    . dorzolamide (TRUSOPT) 2 % ophthalmic solution Place 1 drop into both eyes 2 (two) times daily.    Marland Kitchen. ibuprofen (ADVIL,MOTRIN) 600 MG tablet Take 1 tablet (600 mg total) by mouth every 8 (eight) hours as needed. 30 tablet 0   No facility-administered medications prior to visit.     ROS Review of Systems  Constitutional: Negative.   Respiratory: Negative.   Cardiovascular: Negative.   Gastrointestinal: Negative.   Genitourinary: Negative.   Skin: Negative.   Psychiatric/Behavioral: Negative.    Objective:  BP 132/80 (BP Location: Right Arm, Patient Position: Sitting, Cuff Size: Normal)   Pulse 74   Temp 98.8 F (37.1 C) (Oral)   Resp 18   Ht 5' (1.524 m)   Wt 142 lb 12.8 oz (64.8 kg)   LMP 06/12/2017 (Exact Date)   SpO2 100%   BMI 27.89 kg/m   BP/Weight 07/08/2017 06/16/2017 09/29/2015  Systolic BP 132 132 127  Diastolic BP 80 84 81  Wt. (Lbs) 142.8 146.6 166.1  BMI 27.89 29.11 29.43   Physical Exam  Constitutional:  She appears well-developed and well-nourished.  Cardiovascular: Normal rate, regular rhythm, normal heart sounds and intact distal pulses.   Pulmonary/Chest: Effort normal and breath sounds normal. Right breast exhibits no mass, no nipple discharge, no skin change and no tenderness. Left breast exhibits no mass, no nipple discharge, no skin change and no tenderness.  Abdominal: Soft. Bowel sounds are normal.  Genitourinary: Cervix exhibits discharge (white, thin, malodorous discharge). Vaginal discharge (white, thin, malodorous discharge) found.  Skin: Skin is warm and dry.  Psychiatric: She has a normal mood and affect.  Nursing note and vitals reviewed.   Assessment & Plan:   Problem List Items Addressed This Visit    None    Visit Diagnoses    Well woman exam with routine gynecological exam    -  Primary   Relevant Orders   Cytology - PAP [place] (Completed)   Vaginal discharge       Relevant Medications   metroNIDAZOLE (FLAGYL) 500 MG tablet   Screening for STDs (sexually transmitted diseases)       Relevant Orders   Cytology - PAP [place] (Completed)   HEP, RPR, HIV Panel   HSV(herpes simplex vrs) 1+2 ab-IgG   Essential hypertension       History of elevated blood pressures will prescribe HCTZ 12.5 mg QD.   Schedule BP recheck in 2 weeks with clinic RN   If BP is greater  than 90/60 (MAP 65 or greater) but not less than 130/80 may increase dose of HCTZ    to 25 mg QD and recheck in another 2 weeks with clinic RN    Follow-up with PCP in 3 months.   Relevant Medications   hydrochlorothiazide (HYDRODIURIL) 12.5 MG tablet   History of nipple discharge       Relevant Orders   TSH   Prolactin      Meds ordered this encounter  Medications  . DISCONTD: hydrochlorothiazide (HYDRODIURIL) 12.5 MG tablet    Sig: Take 1 tablet (12.5 mg total) by mouth daily.    Dispense:  30 tablet    Refill:  2    Order Specific Question:   Supervising Provider    Answer:   Quentin Angst [id number]  . DISCONTD: metroNIDAZOLE (FLAGYL) 500 MG tablet    Sig: Take 1 tablet (500 mg total) by mouth 2 (two) times daily.    Dispense:  14 tablet    Refill:  0    Order Specific Question:   Supervising Provider    Answer:   Quentin Angst [id number]  . metroNIDAZOLE (FLAGYL) 500 MG tablet    Sig: Take 1 tablet (500 mg total) by mouth 2 (two) times daily.    Dispense:  14 tablet    Refill:  0  . hydrochlorothiazide (HYDRODIURIL) 12.5 MG tablet    Sig: Take 1 tablet (12.5 mg total) by mouth daily.    Dispense:  30 tablet    Refill:  2    Follow-up: Return in about 2 weeks (around 07/22/2017) for BP check with [place].   Sylvia Bark FNP

## 2017-07-08 NOTE — Patient Instructions (Addendum)
Hypertension Hypertension is another name for high blood pressure. High blood pressure forces your heart to work harder to pump blood. This can cause problems over time. There are two numbers in a blood pressure reading. There is a top number (systolic) over a bottom number (diastolic). It is best to have a blood pressure below 120/80. Healthy choices can help lower your blood pressure. You may need medicine to help lower your blood pressure if:  Your blood pressure cannot be lowered with healthy choices.  Your blood pressure is higher than 130/80.  Follow these instructions at home: Eating and drinking  If directed, follow the DASH eating plan. This diet includes: ? Filling half of your plate at each meal with fruits and vegetables. ? Filling one quarter of your plate at each meal with whole grains. Whole grains include whole wheat pasta, brown rice, and whole grain bread. ? Eating or drinking low-fat dairy products, such as skim milk or low-fat yogurt. ? Filling one quarter of your plate at each meal with low-fat (lean) proteins. Low-fat proteins include fish, skinless chicken, eggs, beans, and tofu. ? Avoiding fatty meat, cured and processed meat, or chicken with skin. ? Avoiding premade or processed food.  Eat less than 1,500 mg of salt (sodium) a day.  Limit alcohol use to no more than 1 drink a day for nonpregnant women and 2 drinks a day for men. One drink equals 12 oz of beer, 5 oz of wine, or 1 oz of hard liquor. Lifestyle  Work with your doctor to stay at a healthy weight or to lose weight. Ask your doctor what the best weight is for you.  Get at least 30 minutes of exercise that causes your heart to beat faster (aerobic exercise) most days of the week. This may include walking, swimming, or biking.  Get at least 30 minutes of exercise that strengthens your muscles (resistance exercise) at least 3 days a week. This may include lifting weights or pilates.  Do not use any  products that contain nicotine or tobacco. This includes cigarettes and e-cigarettes. If you need help quitting, ask your doctor.  Check your blood pressure at home as told by your doctor.  Keep all follow-up visits as told by your doctor. This is important. Medicines  Take over-the-counter and prescription medicines only as told by your doctor. Follow directions carefully.  Do not skip doses of blood pressure medicine. The medicine does not work as well if you skip doses. Skipping doses also puts you at risk for problems.  Ask your doctor about side effects or reactions to medicines that you should watch for. Contact a doctor if:  You think you are having a reaction to the medicine you are taking.  You have headaches that keep coming back (recurring).  You feel dizzy.  You have swelling in your ankles.  You have trouble with your vision. Get help right away if:  You get a very bad headache.  You start to feel confused.  You feel weak or numb.  You feel faint.  You get very bad pain in your: ? Chest. ? Belly (abdomen).  You throw up (vomit) more than once.  You have trouble breathing. Summary  Hypertension is another name for high blood pressure.  Making healthy choices can help lower blood pressure. If your blood pressure cannot be controlled with healthy choices, you may need to take medicine. This information is not intended to replace advice given to you by your health care   provider. Make sure you discuss any questions you have with your health care provider. Document Released: 05/25/2008 Document Revised: 11/04/2016 Document Reviewed: 11/04/2016 Elsevier Interactive Patient Education  2018 [employer]     Vaginitis Vaginitis is an inflammation of the vagina. It can happen when the normal bacteria and yeast in the vagina grow too much. There are different types. Treatment will depend on the type you have. Follow these instructions at home:  Take all  medicines as told by your doctor.  Keep your vagina area clean and dry. Avoid soap. Rinse the area with water.  Avoid washing and cleaning out the vagina (douching).  Do not use tampons or have sex (intercourse) until your treatment is done.  Wipe from front to back after going to the restroom.  Wear cotton underwear.  Avoid wearing underwear while you sleep until your vaginitis is gone.  Avoid tight pants. Avoid underwear or nylons without a cotton panel.  Take off wet clothing (such as a bathing suit) as soon as you can.  Use mild, unscented products. Avoid fabric softeners and scented: ? Feminine sprays. ? Laundry detergents. ? Tampons. ? Soaps or bubble baths.  Practice safe sex and use condoms. Get help right away if:  You have belly (abdominal) pain.  You have a fever or lasting symptoms for more than 2-3 days.  You have a fever and your symptoms suddenly get worse. This information is not intended to replace advice given to you by your health care provider. Make sure you discuss any questions you have with your health care provider. Document Released: 03/05/2009 Document Revised: 05/14/2016 Document Reviewed: 05/19/2012 Elsevier Interactive Patient Education  2017 [employer]

## 2017-07-09 ENCOUNTER — Other Ambulatory Visit: Payer: Self-pay | Admitting: Family Medicine

## 2017-07-09 DIAGNOSIS — B977 Papillomavirus as the cause of diseases classified elsewhere: Secondary | ICD-10-CM | POA: Insufficient documentation

## 2017-07-09 DIAGNOSIS — N760 Acute vaginitis: Secondary | ICD-10-CM

## 2017-07-09 DIAGNOSIS — A749 Chlamydial infection, unspecified: Secondary | ICD-10-CM

## 2017-07-09 DIAGNOSIS — B9689 Other specified bacterial agents as the cause of diseases classified elsewhere: Secondary | ICD-10-CM

## 2017-07-09 LAB — CYTOLOGY - PAP
Adequacy: ABSENT
BACTERIAL VAGINITIS: POSITIVE — AB
CANDIDA VAGINITIS: NEGATIVE
Chlamydia: POSITIVE — AB
DIAGNOSIS: NEGATIVE
HPV (WINDOPATH): DETECTED — AB
Neisseria Gonorrhea: NEGATIVE
Trichomonas: NEGATIVE

## 2017-07-09 MED ORDER — AZITHROMYCIN 500 MG PO TABS: 1000.0000 mg | ORAL_TABLET | Freq: Once | ORAL | 0 refills | Status: AC

## 2017-07-09 NOTE — Telephone Encounter (Signed)
Patient called and informed of her lab results. Recommended repeat pap in 1 year. Medications sent to pharmacy on file. Partner must be treated as well. Patitent communicates understanding.

## 2017-07-12 ENCOUNTER — Telehealth: Payer: Self-pay | Admitting: Physician Assistant

## 2017-07-12 NOTE — Telephone Encounter (Signed)
Pt calling to speak to CMA< states that she spoke to one Friday and needs infor about a script that she is waiting for. Please f/u. Thank you.

## 2017-07-13 ENCOUNTER — Other Ambulatory Visit: Payer: Self-pay | Admitting: Family Medicine

## 2017-07-13 ENCOUNTER — Telehealth: Payer: Self-pay | Admitting: Family Medicine

## 2017-07-13 DIAGNOSIS — N898 Other specified noninflammatory disorders of vagina: Secondary | ICD-10-CM

## 2017-07-13 DIAGNOSIS — Z87898 Personal history of other specified conditions: Secondary | ICD-10-CM

## 2017-07-13 DIAGNOSIS — Z113 Encounter for screening for infections with a predominantly sexual mode of transmission: Secondary | ICD-10-CM

## 2017-07-13 LAB — CERVICOVAGINAL ANCILLARY ONLY: Herpes: NEGATIVE

## 2017-07-13 MED ORDER — METRONIDAZOLE 500 MG PO TABS: 500.0000 mg | ORAL_TABLET | Freq: Two times a day (BID) | ORAL | 0 refills | Status: DC

## 2017-07-13 NOTE — Telephone Encounter (Signed)
Pt calling and needs infor about a script that she is waiting for. Please advice  Thank you.

## 2017-07-13 NOTE — Telephone Encounter (Signed)
Called and spoke with patient.

## 2017-07-13 NOTE — Telephone Encounter (Signed)
Called and spoke with patient. [facility]. She [facility] [place]. She asks about getting additional STD lab screening. Screening was ordered day of office visit. Recommend she schedule lab appointment to obtain labs.

## 2017-07-15 ENCOUNTER — Ambulatory Visit: Payer: Medicare Other | Attending: Physician Assistant

## 2017-07-15 DIAGNOSIS — Z113 Encounter for screening for infections with a predominantly sexual mode of transmission: Secondary | ICD-10-CM

## 2017-07-15 DIAGNOSIS — Z87898 Personal history of other specified conditions: Secondary | ICD-10-CM

## 2017-07-16 LAB — PROLACTIN: PROLACTIN: 11.2 ng/mL (ref 4.8–23.3)

## 2017-07-16 LAB — HEP, RPR, HIV PANEL
HIV SCREEN 4TH GENERATION: NONREACTIVE
Hepatitis B Surface Ag: NEGATIVE
RPR: NONREACTIVE

## 2017-07-16 LAB — HSV(HERPES SIMPLEX VRS) I + II AB-IGG
HSV 1 Glycoprotein G Ab, IgG: <0.91 index (ref 0.00–0.90)
HSV 2 Glycoprotein G Ab, IgG: <0.91 index (ref 0.00–0.90)

## 2017-07-16 LAB — TSH: TSH: 2.69 u[IU]/mL (ref 0.450–4.500)

## 2017-08-06 ENCOUNTER — Telehealth: Payer: Self-pay | Admitting: *Deleted

## 2017-08-06 NOTE — Telephone Encounter (Signed)
Patient verified DOB Patient is aware of labs being negative. No further questions at this time.

## 2017-08-06 NOTE — Telephone Encounter (Signed)
-----   Message from Lizbeth Bark, FNP sent at 07/23/2017 11:33 AM EDT ----- HIV, Hepatitis B, and syphilis are all negative. Herpes type 1 and 2  is negative. Thyroid function is normal and Prolactin level is normal. When abnormal they can cause nipple discharge.

## 2017-09-20 DIAGNOSIS — H401193 Primary open-angle glaucoma, unspecified eye, severe stage: Secondary | ICD-10-CM | POA: Diagnosis not present

## 2017-10-06 ENCOUNTER — Telehealth: Payer: Self-pay | Admitting: *Deleted

## 2017-10-06 NOTE — Telephone Encounter (Signed)
Pt called to get results for  lab work. She requested female to do gynecological exam. She was informed of results and informed of possible f/u for abnormal PAP  from July. Please follow up with patient.

## 2017-10-06 NOTE — Telephone Encounter (Signed)
Please schedule appointment for this patient here at [facility] to ascertain what the problem may be and whether or not she needs a GYN referral.

## 2017-10-07 NOTE — Telephone Encounter (Signed)
Patient would like to change PCP to you, as she would rather be seen by a woman for all care, patient aware that you are  out for vacation, patient agrees to wait until you  returns to be seen.

## 2017-10-20 NOTE — Telephone Encounter (Signed)
Noted. She can schedule appointment with our office to be seen.

## 2017-10-26 NOTE — Telephone Encounter (Signed)
Attempt to call patient to f/u with plan of care for f/u. Pt request to call back.

## 2017-10-26 NOTE — Telephone Encounter (Signed)
Pt asked question because she does not understand the reason to return for OV to f/u with abnl results on PAP Smear that was done by this provider.   Can she be referred to GYN since results provided here from the test / exam she had here?  Please advise.

## 2017-10-28 ENCOUNTER — Other Ambulatory Visit: Payer: Self-pay | Admitting: Family Medicine

## 2017-10-28 DIAGNOSIS — B977 Papillomavirus as the cause of diseases classified elsewhere: Secondary | ICD-10-CM

## 2017-10-28 NOTE — Telephone Encounter (Signed)
Will refer to gynecology per patient request. However patient's pap result was negative for any lesions or malignancy, but was positive for HPV.

## 2017-10-29 NOTE — Telephone Encounter (Signed)
Pt aware that referral was placed to GYN specialist.

## 2017-11-24 DIAGNOSIS — R87810 Cervical high risk human papillomavirus (HPV) DNA test positive: Secondary | ICD-10-CM | POA: Diagnosis not present

## 2017-11-24 DIAGNOSIS — B977 Papillomavirus as the cause of diseases classified elsewhere: Secondary | ICD-10-CM | POA: Diagnosis not present

## 2017-12-28 ENCOUNTER — Other Ambulatory Visit: Payer: Self-pay | Admitting: Family Medicine

## 2018-02-18 ENCOUNTER — Emergency Department (HOSPITAL_COMMUNITY)
Admission: EM | Admit: 2018-02-18 | Discharge: 2018-02-18 | Disposition: A | Discharge status: Home / Self Care | Payer: Medicare Other | Attending: Emergency Medicine | Admitting: Emergency Medicine

## 2018-02-18 ENCOUNTER — Other Ambulatory Visit: Payer: Self-pay

## 2018-02-18 ENCOUNTER — Encounter (HOSPITAL_COMMUNITY): Payer: Self-pay

## 2018-02-18 DIAGNOSIS — J029 Acute pharyngitis, unspecified: Secondary | ICD-10-CM | POA: Diagnosis not present

## 2018-02-18 DIAGNOSIS — H6692 Otitis media, unspecified, left ear: Secondary | ICD-10-CM | POA: Diagnosis not present

## 2018-02-18 DIAGNOSIS — R0981 Nasal congestion: Secondary | ICD-10-CM | POA: Diagnosis not present

## 2018-02-18 DIAGNOSIS — H60502 Unspecified acute noninfective otitis externa, left ear: Secondary | ICD-10-CM | POA: Insufficient documentation

## 2018-02-18 DIAGNOSIS — Z79899 Other long term (current) drug therapy: Secondary | ICD-10-CM | POA: Insufficient documentation

## 2018-02-18 DIAGNOSIS — H9202 Otalgia, left ear: Secondary | ICD-10-CM | POA: Diagnosis not present

## 2018-02-18 DIAGNOSIS — H6690 Otitis media, unspecified, unspecified ear: Secondary | ICD-10-CM

## 2018-02-18 LAB — RAPID STREP SCREEN (MED CTR MEBANE ONLY): Streptococcus, Group A Screen (Direct): NEGATIVE

## 2018-02-18 MED ORDER — NEOMYCIN-POLYMYXIN-HC 3.5-10000-1 OT SUSP
4.0000 [drp] | Freq: Four times a day (QID) | OTIC | Status: DC
  Administered 2018-02-18: 4 [drp] via OTIC
  Filled 2018-02-18: qty 10

## 2018-02-18 MED ORDER — AMOXICILLIN 500 MG PO CAPS: 500.0000 mg | ORAL_CAPSULE | Freq: Two times a day (BID) | ORAL | 0 refills | Status: AC

## 2018-02-18 NOTE — ED Notes (Signed)
Patient is A&Ox4 at this time.  Patient in no signs of distress.  Please see providers note for complete history and physical exam.  

## 2018-02-18 NOTE — ED Triage Notes (Signed)
Pt complains of sore throat and earache x 3 days.

## 2018-02-18 NOTE — ED Notes (Signed)
Patient Alert and oriented to baseline. Stable and ambulatory to baseline. Patient verbalized understanding of the discharge instructions.  Patient belongings were taken by the patient.   

## 2018-02-18 NOTE — Discharge Instructions (Signed)
Please use the eardrops for the next 7-10 days.  Please put 4 drops in your left ear 4 times a day.  Please take Ibuprofen (Advil, motrin) and Tylenol (acetaminophen) to relieve your pain.  You may take up to 600 MG (3 pills) of normal strength ibuprofen every 8 hours as needed.  In between doses of ibuprofen you make take tylenol, up to 1,000 mg (two extra strength pills).  Do not take more than 3,000 mg tylenol in a 24 hour period.  Please check all medication labels as many medications such as pain and cold medications may contain tylenol.  Do not drink alcohol while taking these medications.  Do not take other NSAID'S while taking ibuprofen (such as aleve or naproxen).  Please take ibuprofen with food to decrease stomach upset.

## 2018-02-18 NOTE — ED Provider Notes (Signed)
[facility] EMERGENCY DEPARTMENT Provider Note   CSN: [id number] Arrival date & time: 02/18/18  1627     History   Chief Complaint Chief Complaint  Patient presents with  . Sore Throat  . Otalgia    HPI Sylvia Thompson is a 32 y.o. female with history of legally blind, who presents today for evaluation of left ear pain.  She reports that her ear started hurting 3 days ago and her throat started hurting around the same time.  She denies difficulty swallowing, has been taking ibuprofen and tylenol for her pain.  No fevers.  She reports nasal congestion for the past week.  No cough.  Able to eat and drink ok.   HPI  Past Medical History:  Diagnosis Date  . Blindness, legal   . Cataract   . Glaucoma   . Prosthetic eye globe     Patient Active Problem List   Diagnosis Date Noted  . 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 Blindness, legal     Past Surgical History:  Procedure Laterality Date  . DILATION AND CURETTAGE OF UTERUS    . EYE SURGERY     prosthetic right eye    OB History    No data available       Home Medications    Prior to Admission medications   Medication Sig Start Date End Date Taking? Authorizing Provider  amoxicillin (AMOXIL) 500 MG capsule Take 1 capsule (500 mg total) by mouth 2 (two) times daily for 7 days. 02/18/18 02/25/18  Cristina Gong, PA-C  brimonidine (ALPHAGAN P) 0.1 % SOLN Place 1 drop into the left eye 2 (two) times daily.    [provider]  Brinzolamide-Brimonidine [facility] OP) Place 1 drop into the left eye at bedtime.    [provider]  dorzolamide (TRUSOPT) 2 % ophthalmic solution Place 1 drop into both eyes 2 (two) times daily.    [provider]  hydrochlorothiazide (HYDRODIURIL) 12.5 MG tablet Take 1 tablet (12.5 mg total) by mouth daily. 07/08/17   Lizbeth Bark, FNP  ibuprofen (ADVIL,MOTRIN) 600 MG tablet Take 1 tablet (600 mg total) by mouth every 8 (eight) hours as needed.  06/16/17   Loletta Specter, PA-C  metroNIDAZOLE (FLAGYL) 500 MG tablet Take 1 tablet (500 mg total) by mouth 2 (two) times daily. 07/13/17   Lizbeth Bark, FNP    Family History No family history on file.  Social History Social History   Tobacco Use  . Smoking status: Never Smoker  . Smokeless tobacco: Never Used  Substance Use Topics  . Alcohol use: Yes  . Drug use: No     Allergies   Patient has no known allergies.   Review of Systems Review of Systems  Constitutional: Negative for chills, fatigue and fever.  HENT: Positive for congestion, ear pain, postnasal drip, rhinorrhea and sore throat. Negative for sinus pressure, sinus pain, trouble swallowing and voice change.   Respiratory: Positive for cough. Negative for chest tightness and shortness of breath.   Cardiovascular: Negative for chest pain and palpitations.  Gastrointestinal: Negative for nausea and vomiting.  Musculoskeletal: Negative for neck pain and neck stiffness.  Neurological: Negative for headaches.  All other systems reviewed and are negative.    Physical Exam Updated Vital Signs BP 128/69 (BP Location: Right Arm)   Pulse 88   Temp 98.4 F (36.9 C) (Oral)   Resp 16   Wt 64.4 kg (142 lb)   SpO2  100%   BMI 27.73 kg/m   Physical Exam  Constitutional: She appears well-developed and well-nourished. No distress.  HENT:  Head: Normocephalic and atraumatic.  Right Ear: Tympanic membrane, external ear and ear canal normal.  Left Ear: External ear normal. There is tenderness. A middle ear effusion is present.  Nose: Mucosal edema and rhinorrhea present.  Mouth/Throat: Uvula is midline, oropharynx is clear and moist and mucous membranes are normal. No oral lesions. No oropharyngeal exudate. No tonsillar exudate.  Left ear has motion pain and tragus pain with movement.  The ear canal is edematous and erythematous.  The left TM is red, not obviously bulging.  No mastoid TTP, ecchymosis, erythema  or swelling. No trismus.    Eyes: Conjunctivae are normal. No scleral icterus.  Neck: Normal range of motion. Neck supple.  Cardiovascular: Normal rate and regular rhythm.  Pulmonary/Chest: Effort normal and breath sounds normal. No respiratory distress. She has no wheezes.  Lymphadenopathy:    She has no cervical adenopathy.  Neurological: She is alert.  Skin: Skin is warm and dry. She is not diaphoretic.  Psychiatric: She has a normal mood and affect. Her behavior is normal.  Nursing note and vitals reviewed.    ED Treatments / Results  Labs (all labs ordered are listed, but only abnormal results are displayed) Labs Reviewed  RAPID STREP SCREEN (NOT AT [facility])  CULTURE, GROUP A STREP [facility])    EKG  EKG Interpretation None       Radiology No results found.  Procedures Procedures (including critical care time)  Medications Ordered in ED Medications  neomycin-polymyxin-hydrocortisone (CORTISPORIN) OTIC (EAR) suspension 4 drop (4 drops Left EAR Given 02/18/18 1936)     Initial Impression / Assessment and Plan / ED Course  I have reviewed the triage vital signs and the nursing notes.  Pertinent labs & imaging results that were available during my care of the patient were reviewed by me and considered in my medical decision making (see chart for details).    Otitis externa   Pt afebrile in NAD. Exam non concerning for mastoiditis, cellulitis or malignant OE. Questionable otitis media with red ear drum, not obviously bulging but will treat with amoxil based on recent cold symptoms.   Advised PCP follow up in 2-3 days if no improvement with treatment or no complete resolution by 7 days or any concerns or worsening symptoms.     Final Clinical Impressions(s) / ED Diagnoses   Final diagnoses:  Acute otitis externa of left ear, unspecified type  Acute otitis media, unspecified otitis media type    ED Discharge Orders        Ordered    amoxicillin (AMOXIL) 500 MG  capsule  2 times daily     02/18/18 1931       Norman ClayHammond, Tellis Spivak W, PA-C 02/18/18 1942    Gwyneth SproutPlunkett, Whitney, MD 02/19/18 0028

## 2018-02-21 LAB — CULTURE, GROUP A STREP (THRC)

## 2018-03-10 ENCOUNTER — Ambulatory Visit: Payer: Medicare Other | Attending: Physician Assistant | Admitting: Physician Assistant

## 2018-03-10 ENCOUNTER — Other Ambulatory Visit: Payer: Self-pay

## 2018-03-10 VITALS — BP 128/84 | HR 83 | Temp 98.7°F | Wt 138.4 lb

## 2018-03-10 DIAGNOSIS — R05 Cough: Secondary | ICD-10-CM | POA: Diagnosis not present

## 2018-03-10 DIAGNOSIS — R059 Cough, unspecified: Secondary | ICD-10-CM

## 2018-03-10 DIAGNOSIS — H9202 Otalgia, left ear: Secondary | ICD-10-CM | POA: Insufficient documentation

## 2018-03-10 DIAGNOSIS — J029 Acute pharyngitis, unspecified: Secondary | ICD-10-CM | POA: Diagnosis not present

## 2018-03-10 DIAGNOSIS — Z79899 Other long term (current) drug therapy: Secondary | ICD-10-CM | POA: Insufficient documentation

## 2018-03-10 DIAGNOSIS — N898 Other specified noninflammatory disorders of vagina: Secondary | ICD-10-CM | POA: Insufficient documentation

## 2018-03-10 MED ORDER — GUAIFENESIN-CODEINE 100-10 MG/5ML PO SYRP: 5.0000 mL | ORAL_SOLUTION | Freq: Three times a day (TID) | ORAL | 0 refills | Status: DC | PRN

## 2018-03-10 MED ORDER — FLUCONAZOLE 150 MG PO TABS: 150.0000 mg | ORAL_TABLET | Freq: Once | ORAL | 0 refills | Status: AC

## 2018-03-10 MED ORDER — CETIRIZINE HCL 10 MG PO TABS: 10.0000 mg | ORAL_TABLET | Freq: Every day | ORAL | 0 refills | Status: DC

## 2018-03-10 NOTE — Progress Notes (Signed)
Patient is here for hospital f/up with otitis left ear   Patient complain about left ear pressure still cant hear  Last dose of amoxacillin was last Sunday

## 2018-03-10 NOTE — Patient Instructions (Signed)
If discharge is not better in 1 week, please call us back

## 2018-03-10 NOTE — Progress Notes (Signed)
Chief Complaint: ED follow up  Subjective: His is a 32 year old lady with a history a history of elevated blood pressure and has been prescribed antihypertensives in the past. She has not taken them in her blood pressure has been okay. She was last seen here about 9 months ago. She went to the ED on 02/18/2018 with complaints of left ear pain and sore throat. Her symptoms have been going on for about 3 days. She has been trying over-the-counter medications with temporary relief. No fevers. Her vital signs were stable. No chest x-ray was done. She was strep negative. She was given an eye drop in the ED and sent home with a prescription for amoxicillin. She states were initially 3-4 days later she started feeling better. Now her symptoms have recurred. She still has a lot of left ear pressure and throbbing. Her cough is now productive of brown sputum occasionally. Her appetite is decreased. Her throat feels better. No fevers.  She further states that she feels like the antibiotics may have caused a yeast infection. She has a white discharge that is itchy. She is also completing her menstrual cycle and declines a pelvic exam at this time.   ROS:  GEN: denies fever or chills, denies change in weight HEENT: denies headache, earache, epistaxis, sore throat, or neck pain LUNGS: minimal SHOB, dyspnea, PND, orthopnea +cough CV: denies CP or palpitations NEURO: denies numbness or tingling, denies sz, stroke or TIA   Objective:  Vitals:   03/10/18 1119  BP: 128/84  Pulse: 83  Temp: 98.7 F (37.1 C)  TempSrc: Oral  Weight: 138 lb 6.4 oz (62.8 kg)    Physical Exam:  General: in no acute distress. HEENT: no pallor, no icterus, moist oral mucosa, no JVD, no lymphadenopathy; left ear with fluid and cerumen Heart: Normal  s1 &s2  Regular rate and rhythm, without murmurs, rubs, gallops. Lungs: Clear to auscultation bilaterally. Pelvic-menstruating, declined  Pertinent Lab  Results:none   Medications: Prior to Admission medications   Medication Sig Start Date End Date Taking? Authorizing Provider  brimonidine (ALPHAGAN P) 0.1 % SOLN Place 1 drop into the left eye 2 (two) times daily.   Yes [provider]  Brinzolamide-Brimonidine [facility] OP) Place 1 drop into the left eye at bedtime.   Yes [provider]  dorzolamide (TRUSOPT) 2 % ophthalmic solution Place 1 drop into both eyes 2 (two) times daily.   Yes [provider]  cetirizine (ZYRTEC) 10 MG tablet Take 1 tablet (10 mg total) by mouth daily. 03/10/18   Vivianne Master, PA-C  fluconazole (DIFLUCAN) 150 MG tablet Take 1 tablet (150 mg total) by mouth once for 1 dose. 03/10/18 03/10/18  Scot Jun S, PA-C  guaiFENesin-codeine (ROBITUSSIN AC) 100-10 MG/5ML syrup Take 5 mLs by mouth 3 (three) times daily as needed for cough. 03/10/18   Vivianne Master, PA-C  hydrochlorothiazide (HYDRODIURIL) 12.5 MG tablet Take 1 tablet (12.5 mg total) by mouth daily. Patient not taking: Reported on 03/10/2018 07/08/17   Lizbeth Bark, FNP  ibuprofen (ADVIL,MOTRIN) 600 MG tablet Take 1 tablet (600 mg total) by mouth every 8 (eight) hours as needed. Patient not taking: Reported on 03/10/2018 06/16/17   Loletta Specter, PA-C  metroNIDAZOLE (FLAGYL) 500 MG tablet Take 1 tablet (500 mg total) by mouth 2 (two) times daily. Patient not taking: Reported on 03/10/2018 07/13/17   Lizbeth Bark, FNP    Assessment: 1. Left Ear Pain/Fluid 2. Cough 3. Vaginal DC  Plan:  Zyrtec OTC Cough syrup with codeine Supportive care Diflucan If discharge is not better in 1 week, she will return for formal pelvic exam   Follow up:2-4 weeks  The patient was given clear instructions to go to ER or return to medical center if symptoms don't improve, worsen or new problems develop. The patient verbalized understanding. The patient was told to call to get lab results if they haven't heard anything in the  next week.   This note has been created with Education officer, environmentalDragon speech recognition software and smart phrase technology. Any transcriptional errors are unintentional.   Scot Juniffany Manisha Cancel, PA-C 03/10/2018, 11:42 AM

## 2018-04-13 ENCOUNTER — Ambulatory Visit: Payer: Medicare Other | Admitting: Nurse Practitioner

## 2018-04-22 ENCOUNTER — Ambulatory Visit: Payer: Medicare Other | Attending: Nurse Practitioner | Admitting: Nurse Practitioner

## 2018-04-22 ENCOUNTER — Other Ambulatory Visit (HOSPITAL_COMMUNITY)
Admission: RE | Admit: 2018-04-22 | Discharge: 2018-04-22 | Disposition: A | Discharge status: Home / Self Care | Payer: Medicare Other | Source: Ambulatory Visit | Attending: Nurse Practitioner | Admitting: Nurse Practitioner

## 2018-04-22 ENCOUNTER — Encounter: Payer: Self-pay | Admitting: Nurse Practitioner

## 2018-04-22 ENCOUNTER — Encounter (INDEPENDENT_AMBULATORY_CARE_PROVIDER_SITE_OTHER): Payer: Self-pay

## 2018-04-22 VITALS — BP 124/80 | HR 76 | Temp 98.4°F | Ht 60.0 in | Wt 131.6 lb

## 2018-04-22 DIAGNOSIS — Z8261 Family history of arthritis: Secondary | ICD-10-CM | POA: Diagnosis not present

## 2018-04-22 DIAGNOSIS — I10 Essential (primary) hypertension: Secondary | ICD-10-CM | POA: Insufficient documentation

## 2018-04-22 DIAGNOSIS — H409 Unspecified glaucoma: Secondary | ICD-10-CM | POA: Insufficient documentation

## 2018-04-22 DIAGNOSIS — H269 Unspecified cataract: Secondary | ICD-10-CM | POA: Insufficient documentation

## 2018-04-22 DIAGNOSIS — Z202 Contact with and (suspected) exposure to infections with a predominantly sexual mode of transmission: Secondary | ICD-10-CM

## 2018-04-22 DIAGNOSIS — Z91040 Latex allergy status: Secondary | ICD-10-CM | POA: Insufficient documentation

## 2018-04-22 DIAGNOSIS — H548 Legal blindness, as defined in USA: Secondary | ICD-10-CM | POA: Diagnosis not present

## 2018-04-22 DIAGNOSIS — Z79899 Other long term (current) drug therapy: Secondary | ICD-10-CM | POA: Diagnosis not present

## 2018-04-22 DIAGNOSIS — N921 Excessive and frequent menstruation with irregular cycle: Secondary | ICD-10-CM

## 2018-04-22 DIAGNOSIS — B9689 Other specified bacterial agents as the cause of diseases classified elsewhere: Secondary | ICD-10-CM | POA: Diagnosis not present

## 2018-04-22 DIAGNOSIS — Z833 Family history of diabetes mellitus: Secondary | ICD-10-CM | POA: Diagnosis not present

## 2018-04-22 DIAGNOSIS — A5901 Trichomonal vulvovaginitis: Secondary | ICD-10-CM | POA: Insufficient documentation

## 2018-04-22 NOTE — Patient Instructions (Signed)
Sexually Transmitted Disease  A sexually transmitted disease (STD) is a disease or infection that may be passed (transmitted) from person to person, usually during sexual activity. This may happen by way of saliva, semen, blood, vaginal mucus, or urine. Common STDs include:   Gonorrhea.   Chlamydia.   Syphilis.   HIV and AIDS.   Genital herpes.   Hepatitis B and C.   Trichomonas.   Human papillomavirus (HPV).   Pubic lice.   Scabies.   Mites.   Bacterial vaginosis.    What are the causes?  An STD may be caused by bacteria, a virus, or parasites. STDs are often transmitted during sexual activity if one person is infected. However, they may also be transmitted through nonsexual means. STDs may be transmitted after:   Sexual intercourse with an infected person.   Sharing sex toys with an infected person.   Sharing needles with an infected person or using unclean piercing or tattoo needles.   Having intimate contact with the genitals, mouth, or rectal areas of an infected person.   Exposure to infected fluids during birth.    What are the signs or symptoms?  Different STDs have different symptoms. Some people may not have any symptoms. If symptoms are present, they may include:   Painful or bloody urination.   Pain in the pelvis, abdomen, vagina, anus, throat, or eyes.   A skin rash, itching, or irritation.   Growths, ulcerations, blisters, or sores in the genital and anal areas.   Abnormal vaginal discharge with or without bad odor.   Penile discharge in men.   Fever.   Pain or bleeding during sexual intercourse.   Swollen glands in the groin area.   Yellow skin and eyes (jaundice). This is seen with hepatitis.   Swollen testicles.   Infertility.   Sores and blisters in the mouth.    How is this diagnosed?  To make a diagnosis, your health care provider may:   Take a medical history.   Perform a physical exam.   Take a sample of any discharge to examine.   Swab the throat, cervix,  opening to the penis, rectum, or vagina for testing.   Test a sample of your first morning urine.   Perform blood tests.   Perform a Pap test, if this applies.   Perform a colposcopy.   Perform a laparoscopy.    How is this treated?  Treatment depends on the STD. Some STDs may be treated but not cured.   Chlamydia, gonorrhea, trichomonas, and syphilis can be cured with antibiotic medicine.   Genital herpes, hepatitis, and HIV can be treated, but not cured, with prescribed medicines. The medicines lessen symptoms.   Genital warts from HPV can be treated with medicine or by freezing, burning (electrocautery), or surgery. Warts may come back.   HPV cannot be cured with medicine or surgery. However, abnormal areas may be removed from the cervix, vagina, or vulva.   If your diagnosis is confirmed, your recent sexual partners need treatment. This is true even if they are symptom-free or have a negative culture or evaluation. They should not have sex until their health care providers say it is okay.   Your health care provider may test you for infection again 3 months after treatment.    How is this prevented?  Take these steps to reduce your risk of getting an STD:   Use latex condoms, dental dams, and water-soluble lubricants during sexual activity. Do not use   petroleum jelly or oils.   Avoid having multiple sex partners.   Do not have sex with someone who has other sex partners.   Do not have sex with anyone you do not know or who is at high risk for an STD.   Avoid risky sex practices that can break your skin.   Do not have sex if you have open sores on your mouth or skin.   Avoid drinking too much alcohol or taking illegal drugs. Alcohol and drugs can affect your judgment and put you in a vulnerable position.   Avoid engaging in oral and anal sex acts.   Get vaccinated for HPV and hepatitis. If you have not received these vaccines in the past, talk to your health care provider about whether one or  both might be right for you.   If you are at risk of being infected with HIV, it is recommended that you take a prescription medicine daily to prevent HIV infection. This is called pre-exposure prophylaxis (PrEP). You are considered at risk if:  ? You are a man who has sex with other men (MSM).  ? You are a heterosexual man or woman and are sexually active with more than one partner.  ? You take drugs by injection.  ? You are sexually active with a partner who has HIV.   Talk with your health care provider about whether you are at high risk of being infected with HIV. If you choose to begin PrEP, you should first be tested for HIV. You should then be tested every 3 months for as long as you are taking PrEP.    Contact a health care provider if:   See your health care provider.   Tell your sexual partner(s). They should be tested and treated for any STDs.   Do not have sex until your health care provider says it is okay.  Get help right away if:  Contact your health care provider right away if:   You have severe abdominal pain.   You are a man and notice swelling or pain in your testicles.   You are a woman and notice swelling or pain in your vagina.    This information is not intended to replace advice given to you by your health care provider. Make sure you discuss any questions you have with your health care provider.  Document Released: 02/27/2003 Document Revised: 06/26/2016 Document Reviewed: 06/27/2013  Elsevier Interactive Patient Education  2018 Elsevier Inc[employer]

## 2018-04-22 NOTE — Progress Notes (Signed)
Assessment & Plan:  Sylvia Thompson was seen today for establish care.  Diagnoses and all orders for this visit:  Exposure to sexually transmitted disease (STD) -     Cervicovaginal ancillary only -     HIV antibody (with reflex)  Metrorrhagia -     POCT urine pregnancy -     hCG, serum, qualitative    Patient has been counseled on age-appropriate routine health concerns for screening and prevention. These are reviewed and up-to-date. Referrals have been placed accordingly. Immunizations are up-to-date or declined.    Subjective:   Chief Complaint  Patient presents with  . Establish Care    Patient is here to establish care. Pt. stated her cough and her ears are much better and went away.    HPI Sylvia Thompson 32 y.o. female presents to office today to establish care. 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  She has a family history of glaucoma and cataract as well in mother and sister. She is requesting STD testing today and has complaints of a "boil" near the left perineal area.  She reports this boil is  similar to other boils that she had when she was pregnant with her son in the past.  However she reports this boil is the largest one she has had I have instructed her to apply warm compresses as well as soak in a bathtub of warm water and epsom salt to help relieve the pressure.   Sexually Transmitted Disease Check Patient presents for sexually transmitted disease check. Sexual history reviewed with the patient. STD exposure: current sexual partner thought to have no history of any STD.  Previous history of STD:  trichomonas. Current symptoms include vaginal discharge: copious.  Contraception: none. She is sexually active with one partner. She is also endorsing irregular periods over the past few months.   Review of Systems  Constitutional: Negative for fever, malaise/fatigue and weight loss.  Eyes: Positive for blurred vision  (left eye). Negative for discharge and redness.       SEE HPI  Respiratory: Negative.  Negative for cough and shortness of breath.   Cardiovascular: Negative.  Negative for chest pain, palpitations and leg swelling.  Gastrointestinal: Negative.  Negative for heartburn, nausea and vomiting.  Genitourinary:       SEE HPI  Musculoskeletal: Negative.  Negative for myalgias.  Skin:       SEE HPI  Neurological: Negative.  Negative for dizziness, focal weakness, seizures and headaches.  Endo/Heme/Allergies: Positive for environmental allergies.  Psychiatric/Behavioral: Negative.  Negative for suicidal ideas.    Past Medical History:  Diagnosis Date  . Blindness, legal   . Cataract   . Glaucoma   . Prosthetic eye globe     Past Surgical History:  Procedure Laterality Date  . DILATION AND CURETTAGE OF UTERUS    . EYE SURGERY     prosthetic right eye    Family History  Problem Relation Age of Onset  . Diabetes Mother   . Arthritis Sister     Social History Reviewed with no changes to be made today.   Outpatient Medications Prior to Visit  Medication Sig Dispense Refill  . albuterol (PROAIR HFA) 108 (90 Base) MCG/ACT inhaler Inhale into the lungs every 6 (six) hours as needed for wheezing or shortness of breath.    . brimonidine (ALPHAGAN P) 0.1 % SOLN Place 1 drop into the left eye 2 (two) times daily.    Marland Kitchen  dorzolamide (TRUSOPT) 2 % ophthalmic solution Place 1 drop into both eyes 2 (two) times daily.    . Brinzolamide-Brimonidine (SIMBRINZA OP) Place 1 drop into the left eye at bedtime.    . cetirizine (ZYRTEC) 10 MG tablet Take 1 tablet (10 mg total) by mouth daily. (Patient not taking: Reported on 04/22/2018) 14 tablet 0  . hydrochlorothiazide (HYDRODIURIL) 12.5 MG tablet Take 1 tablet (12.5 mg total) by mouth daily. (Patient not taking: Reported on 03/10/2018) 30 tablet 2  . guaiFENesin-codeine (ROBITUSSIN AC) 100-10 MG/5ML syrup Take 5 mLs by mouth 3 (three) times daily as needed  for cough. (Patient not taking: Reported on 04/22/2018) 120 mL 0  . ibuprofen (ADVIL,MOTRIN) 600 MG tablet Take 1 tablet (600 mg total) by mouth every 8 (eight) hours as needed. (Patient not taking: Reported on 03/10/2018) 30 tablet 0  . metroNIDAZOLE (FLAGYL) 500 MG tablet Take 1 tablet (500 mg total) by mouth 2 (two) times daily. (Patient not taking: Reported on 03/10/2018) 14 tablet 0   No facility-administered medications prior to visit.     Allergies  Allergen Reactions  . Latex Rash       Objective:    BP 124/80 (BP Location: Left Arm, Patient Position: Sitting, Cuff Size: Normal)   Pulse 76   Temp 98.4 F (36.9 C) (Oral)   Ht 5' (1.524 m)   Wt 131 lb 9.6 oz (59.7 kg)   SpO2 100%   BMI 25.70 kg/m  Wt Readings from Last 3 Encounters:  04/22/18 131 lb 9.6 oz (59.7 kg)  03/10/18 138 lb 6.4 oz (62.8 kg)  02/18/18 142 lb (64.4 kg)    Physical Exam  Constitutional: She is oriented to person, place, and time. She appears well-developed and well-nourished. She is cooperative.  HENT:  Head: Normocephalic and atraumatic.  Eyes: Lids are normal. Right eye exhibits abnormal extraocular motion.  Prosthetic right eye  Neck: Normal range of motion.  Cardiovascular: Normal rate, regular rhythm and normal heart sounds. Exam reveals no gallop and no friction rub.  No murmur heard. Pulmonary/Chest: Effort normal and breath sounds normal. No tachypnea. No respiratory distress. She has no decreased breath sounds. She has no wheezes. She has no rhonchi. She has no rales. She exhibits no tenderness.  Abdominal: Soft. Bowel sounds are normal.  Genitourinary:     Musculoskeletal: Normal range of motion. She exhibits no edema.  Neurological: She is alert and oriented to person, place, and time. Coordination normal.  Skin: Skin is warm and dry.  Psychiatric: She has a normal mood and affect. Her behavior is normal. Judgment and thought content normal.  Nursing note and vitals  reviewed.        Patient has been counseled extensively about nutrition and exercise as well as the importance of adherence with medications and regular follow-up. The patient was given clear instructions to go to ER or return to medical center if symptoms don't improve, worsen or new problems develop. The patient verbalized understanding.   Follow-up: Return in about 5 days (around 04/27/2018) for call [facility] office if boil has not improved.   Claiborne Rigg, FNP-BC [facility] [place], [place] [phone]   04/22/2018, 5:28 PM

## 2018-04-23 LAB — HIV ANTIBODY (ROUTINE TESTING W REFLEX): HIV Screen 4th Generation wRfx: NONREACTIVE

## 2018-04-23 LAB — HCG, SERUM, QUALITATIVE: hCG,Beta Subunit,Qual,Serum: NEGATIVE m[IU]/mL (ref <6)

## 2018-04-25 DIAGNOSIS — H401123 Primary open-angle glaucoma, left eye, severe stage: Secondary | ICD-10-CM | POA: Diagnosis not present

## 2018-04-25 LAB — CERVICOVAGINAL ANCILLARY ONLY
Bacterial vaginitis: POSITIVE — AB
CHLAMYDIA, DNA PROBE: NEGATIVE
Candida vaginitis: NEGATIVE
Neisseria Gonorrhea: NEGATIVE
Trichomonas: POSITIVE — AB

## 2018-04-26 ENCOUNTER — Telehealth: Payer: Self-pay | Admitting: Nurse Practitioner

## 2018-04-26 ENCOUNTER — Other Ambulatory Visit: Payer: Self-pay | Admitting: Nurse Practitioner

## 2018-04-26 MED ORDER — METRONIDAZOLE 500 MG PO TABS: 500.0000 mg | ORAL_TABLET | Freq: Two times a day (BID) | ORAL | 0 refills | Status: DC

## 2018-04-26 MED ORDER — METRONIDAZOLE 500 MG PO TABS: 500.0000 mg | ORAL_TABLET | Freq: Two times a day (BID) | ORAL | 0 refills | Status: AC

## 2018-04-26 NOTE — Telephone Encounter (Signed)
Patient called stating that she received a mychart message form her PCP stating that she was positive for Trichomonas and Bacterial vaginitis. Patient also stated that her PCP was going to send over an Rx to the pharmacy but I could not find any medication in the chart. Please f/u

## 2018-04-26 NOTE — Telephone Encounter (Signed)
Pt requested medication to be sent to [facility] on [address]. Rx sent to [facility] on [address].

## 2018-04-27 NOTE — Telephone Encounter (Signed)
Medication sent.

## 2018-06-16 ENCOUNTER — Other Ambulatory Visit (HOSPITAL_COMMUNITY)
Admission: RE | Admit: 2018-06-16 | Discharge: 2018-06-16 | Disposition: A | Discharge status: Home / Self Care | Payer: Medicare Other | Source: Ambulatory Visit | Attending: Physician Assistant | Admitting: Physician Assistant

## 2018-06-16 ENCOUNTER — Ambulatory Visit: Payer: Medicare Other | Attending: Nurse Practitioner | Admitting: Physician Assistant

## 2018-06-16 VITALS — BP 128/72 | HR 82 | Temp 98.2°F | Resp 18 | Ht 60.0 in | Wt 136.0 lb

## 2018-06-16 DIAGNOSIS — H548 Legal blindness, as defined in USA: Secondary | ICD-10-CM | POA: Diagnosis not present

## 2018-06-16 DIAGNOSIS — Z202 Contact with and (suspected) exposure to infections with a predominantly sexual mode of transmission: Secondary | ICD-10-CM | POA: Insufficient documentation

## 2018-06-16 DIAGNOSIS — Z79899 Other long term (current) drug therapy: Secondary | ICD-10-CM | POA: Insufficient documentation

## 2018-06-16 DIAGNOSIS — Z91040 Latex allergy status: Secondary | ICD-10-CM | POA: Insufficient documentation

## 2018-06-16 DIAGNOSIS — H409 Unspecified glaucoma: Secondary | ICD-10-CM | POA: Diagnosis not present

## 2018-06-16 NOTE — Progress Notes (Signed)
Patient ID: Sylvia Thompson, female   DOB: 02-24-1986, 32 y.o.   MRN: [id number]   Sylvia Thompson, is a 32 y.o. female  [id number]  [id number]  DOB - 10/09/1986  Subjective:  Chief Complaint and HPI: Sylvia Thompson is a 33 y.o. female here today for concern for trich.  She tested positive for it twice in the last 6 months.  Partner tested negative and was not treated.  She says she does not have vaginal discharge but that "things just don't feel right" when she is having sex with her partner.  Monogamous with new partner X 6 months.  He has no s/sx.     ROS:   Constitutional:  No f/c, No night sweats, No unexplained weight loss. EENT:  No vision changes, No blurry vision, No hearing changes. No mouth, throat, or ear problems.  Respiratory: No cough, No SOB Cardiac: No CP, no palpitations GI:  No abd pain, No N/V/D. GU: No Urinary s/sx Musculoskeletal: No joint pain Neuro: No headache, no dizziness, no motor weakness.  Skin: No rash Endocrine:  No polydipsia. No polyuria.  Psych: Denies SI/HI  No problems updated.  ALLERGIES: Allergies  Allergen Reactions  . Latex Rash    PAST MEDICAL HISTORY: Past Medical History:  Diagnosis Date  . Blindness, legal   . Cataract   . Glaucoma   . Prosthetic eye globe     MEDICATIONS AT HOME: Prior to Admission medications   Medication Sig Start Date End Date Taking? Authorizing Provider  albuterol (PROAIR HFA) 108 (90 Base) MCG/ACT inhaler Inhale into the lungs every 6 (six) hours as needed for wheezing or shortness of breath.   Yes [provider]  brimonidine (ALPHAGAN P) 0.1 % SOLN Place 1 drop into the left eye 2 (two) times daily.   Yes [provider]  Brinzolamide-Brimonidine [facility] OP) Place 1 drop into the left eye at bedtime.   Yes [provider]  dorzolamide (TRUSOPT) 2 % ophthalmic solution Place 1 drop into both eyes 2 (two) times daily.   Yes [provider]  cetirizine (ZYRTEC)  10 MG tablet Take 1 tablet (10 mg total) by mouth daily. Patient not taking: Reported on 04/22/2018 03/10/18   Vivianne Master, PA-C  hydrochlorothiazide (HYDRODIURIL) 12.5 MG tablet Take 1 tablet (12.5 mg total) by mouth daily. Patient not taking: Reported on 03/10/2018 07/08/17   Lizbeth Bark, FNP     Objective:  EXAM:   Vitals:   06/16/18 1018  BP: 128/72  Pulse: 82  Resp: 18  Temp: 98.2 F (36.8 C)  TempSrc: Oral  SpO2: 100%  Weight: 136 lb (61.7 kg)  Height: 5' (1.524 m)    General appearance : A&OX3. NAD. Non-toxic-appearing HEENT: Atraumatic and Normocephalic.  PERRLA. EOM intact.  Neck: supple, no JVD. No cervical lymphadenopathy. No thyromegaly Chest/Lungs:  Breathing-non-labored, Good air entry bilaterally, breath sounds normal without rales, rhonchi, or wheezing  CVS: S1 S2 regular, no murmurs, gallops, rubs  Extremities: Bilateral Lower Ext shows no edema, both legs are warm to touch with = pulse throughout Neurology:  CN II-XII grossly intact, Non focal.   Psych:  TP linear. J/I WNL. Normal speech. Appropriate eye contact and affect.  Skin:  No Rash  Data Review No results found for: HGBA1C   Assessment & Plan   1. STD exposure asymptomatic Will send Rx to [facility] if needed.  Safe sex precautions reviewed.   - Urine cytology ancillary only   Patient have  been counseled extensively about nutrition and exercise  Return if symptoms worsen or fail to improve.  The patient was given clear instructions to go to ER or return to medical center if symptoms don't improve, worsen or new problems develop. The patient verbalized understanding. The patient was told to call to get lab results if they haven't heard anything in the next week.     Georgian CoAngela Wilmar Prabhakar, PA-C [facility] [place], [place] [phone]   06/16/2018, 10:25 AM

## 2018-06-17 LAB — URINE CYTOLOGY ANCILLARY ONLY
Chlamydia: NEGATIVE
NEISSERIA GONORRHEA: NEGATIVE
TRICH (WINDOWPATH): NEGATIVE

## 2018-06-20 LAB — URINE CYTOLOGY ANCILLARY ONLY: Candida vaginitis: NEGATIVE

## 2018-06-22 ENCOUNTER — Other Ambulatory Visit: Payer: Self-pay | Admitting: Physician Assistant

## 2018-06-22 MED ORDER — METRONIDAZOLE 500 MG PO TABS: 500.0000 mg | ORAL_TABLET | Freq: Two times a day (BID) | ORAL | 0 refills | Status: DC

## 2018-06-24 ENCOUNTER — Telehealth: Payer: Self-pay | Admitting: *Deleted

## 2018-06-24 NOTE — Telephone Encounter (Signed)
-----   Message from Anders SimmondsAngela M McClung, [place]-C sent at 06/22/2018  8:55 AM EDT ----- There was no trichomonas, gonorrhea, chlamydia, or yeast.  There is bacterial vaginosis which is not an STD.  I sent your a prescription for this to the pharmacy.  Thanks, Georgian CoAngela McClung, PA-C

## 2018-06-24 NOTE — Telephone Encounter (Signed)
Patient verified DOB Patient is aware of results. She will complete medications and FU is urination frequency does not subside. No further questions.

## 2018-09-12 DIAGNOSIS — O26859 Spotting complicating pregnancy, unspecified trimester: Secondary | ICD-10-CM | POA: Diagnosis not present

## 2018-09-12 DIAGNOSIS — Z3201 Encounter for pregnancy test, result positive: Secondary | ICD-10-CM | POA: Diagnosis not present

## 2018-09-12 DIAGNOSIS — O09299 Supervision of pregnancy with other poor reproductive or obstetric history, unspecified trimester: Secondary | ICD-10-CM | POA: Diagnosis not present

## 2018-09-14 DIAGNOSIS — B373 Candidiasis of vulva and vagina: Secondary | ICD-10-CM | POA: Diagnosis not present

## 2018-09-14 DIAGNOSIS — Z3201 Encounter for pregnancy test, result positive: Secondary | ICD-10-CM | POA: Diagnosis not present

## 2018-09-29 DIAGNOSIS — Z3201 Encounter for pregnancy test, result positive: Secondary | ICD-10-CM | POA: Diagnosis not present

## 2018-09-29 DIAGNOSIS — O3411 Maternal care for benign tumor of corpus uteri, first trimester: Secondary | ICD-10-CM | POA: Diagnosis not present

## 2018-09-29 DIAGNOSIS — Z3A08 8 weeks gestation of pregnancy: Secondary | ICD-10-CM | POA: Diagnosis not present

## 2018-10-19 ENCOUNTER — Ambulatory Visit (INDEPENDENT_AMBULATORY_CARE_PROVIDER_SITE_OTHER): Payer: Medicare Other | Admitting: Certified Nurse Midwife

## 2018-10-19 ENCOUNTER — Encounter: Payer: Self-pay | Admitting: Certified Nurse Midwife

## 2018-10-19 ENCOUNTER — Other Ambulatory Visit (HOSPITAL_COMMUNITY)
Admission: RE | Admit: 2018-10-19 | Discharge: 2018-10-19 | Disposition: A | Discharge status: Home / Self Care | Payer: Medicare Other | Source: Ambulatory Visit | Attending: Certified Nurse Midwife | Admitting: Certified Nurse Midwife

## 2018-10-19 VITALS — BP 128/76 | HR 80 | Wt 149.8 lb

## 2018-10-19 DIAGNOSIS — A5901 Trichomonal vulvovaginitis: Secondary | ICD-10-CM | POA: Insufficient documentation

## 2018-10-19 DIAGNOSIS — O219 Vomiting of pregnancy, unspecified: Secondary | ICD-10-CM | POA: Insufficient documentation

## 2018-10-19 DIAGNOSIS — B373 Candidiasis of vulva and vagina: Secondary | ICD-10-CM | POA: Insufficient documentation

## 2018-10-19 DIAGNOSIS — Z3685 Encounter for antenatal screening for Streptococcus B: Secondary | ICD-10-CM | POA: Diagnosis not present

## 2018-10-19 DIAGNOSIS — O26891 Other specified pregnancy related conditions, first trimester: Secondary | ICD-10-CM

## 2018-10-19 DIAGNOSIS — B3731 Acute candidiasis of vulva and vagina: Secondary | ICD-10-CM

## 2018-10-19 DIAGNOSIS — N760 Acute vaginitis: Secondary | ICD-10-CM | POA: Diagnosis not present

## 2018-10-19 DIAGNOSIS — Z3481 Encounter for supervision of other normal pregnancy, first trimester: Secondary | ICD-10-CM | POA: Insufficient documentation

## 2018-10-19 DIAGNOSIS — Z3480 Encounter for supervision of other normal pregnancy, unspecified trimester: Secondary | ICD-10-CM | POA: Insufficient documentation

## 2018-10-19 DIAGNOSIS — B9689 Other specified bacterial agents as the cause of diseases classified elsewhere: Secondary | ICD-10-CM | POA: Diagnosis not present

## 2018-10-19 DIAGNOSIS — N898 Other specified noninflammatory disorders of vagina: Secondary | ICD-10-CM

## 2018-10-19 DIAGNOSIS — O23591 Infection of other part of genital tract in pregnancy, first trimester: Secondary | ICD-10-CM

## 2018-10-19 DIAGNOSIS — O26899 Other specified pregnancy related conditions, unspecified trimester: Secondary | ICD-10-CM

## 2018-10-19 MED ORDER — ONDANSETRON 4 MG PO TBDP: 4.0000 mg | ORAL_TABLET | Freq: Three times a day (TID) | ORAL | 2 refills | Status: DC | PRN

## 2018-10-19 NOTE — Patient Instructions (Signed)
First Trimester of Pregnancy The first trimester of pregnancy is from week 1 until the end of week 13 (months 1 through 3). During this time, your baby will begin to develop inside you. At 6-8 weeks, the eyes and face are formed, and the heartbeat can be seen on ultrasound. At the end of 12 weeks, all the baby's organs are formed. Prenatal care is all the medical care you receive before the birth of your baby. Make sure you get good prenatal care and follow all of your doctor's instructions. Follow these instructions at home: Medicines  Take over-the-counter and prescription medicines only as told by your doctor. Some medicines are safe and some medicines are not safe during pregnancy.  Take a prenatal vitamin that contains at least 600 micrograms (mcg) of folic acid.  If you have trouble pooping (constipation), take medicine that will make your stool soft (stool softener) if your doctor approves. Eating and drinking  Eat regular, healthy meals.  Your doctor will tell you the amount of weight gain that is right for you.  Avoid raw meat and uncooked cheese.  If you feel sick to your stomach (nauseous) or throw up (vomit): ? Eat 4 or 5 small meals a day instead of 3 large meals. ? Try eating a few soda crackers. ? Drink liquids between meals instead of during meals.  To prevent constipation: ? Eat foods that are high in fiber, like fresh fruits and vegetables, whole grains, and beans. ? Drink enough fluids to keep your pee (urine) clear or pale yellow. Activity  Exercise only as told by your doctor. Stop exercising if you have cramps or pain in your lower belly (abdomen) or low back.  Do not exercise if it is too hot, too humid, or if you are in a place of great height (high altitude).  Try to avoid standing for long periods of time. Move your legs often if you must stand in one place for a long time.  Avoid heavy lifting.  Wear low-heeled shoes. Sit and stand up straight.  You  can have sex unless your doctor tells you not to. Relieving pain and discomfort  Wear a good support bra if your breasts are sore.  Take warm water baths (sitz baths) to soothe pain or discomfort caused by hemorrhoids. Use hemorrhoid cream if your doctor says it is okay.  Rest with your legs raised if you have leg cramps or low back pain.  If you have puffy, bulging veins (varicose veins) in your legs: ? Wear support hose or compression stockings as told by your doctor. ? Raise (elevate) your feet for 15 minutes, 3-4 times a day. ? Limit salt in your food. Prenatal care  Schedule your prenatal visits by the twelfth week of pregnancy.  Write down your questions. Take them to your prenatal visits.  Keep all your prenatal visits as told by your doctor. This is important. Safety  Wear your seat belt at all times when driving.  Make a list of emergency phone numbers. The list should include numbers for family, friends, the hospital, and police and fire departments. General instructions  Ask your doctor for a referral to a local prenatal class. Begin classes no later than at the start of month 6 of your pregnancy.  Ask for help if you need counseling or if you need help with nutrition. Your doctor can give you advice or tell you where to go for help.  Do not use hot tubs, steam rooms, or   saunas.  Do not douche or use tampons or scented sanitary pads.  Do not cross your legs for long periods of time.  Avoid all herbs and alcohol. Avoid drugs that are not approved by your doctor.  Do not use any tobacco products, including cigarettes, chewing tobacco, and electronic cigarettes. If you need help quitting, ask your doctor. You may get counseling or other support to help you quit.  Avoid cat litter boxes and soil used by cats. These carry germs that can cause birth defects in the baby and can cause a loss of your baby (miscarriage) or stillbirth.  Visit your dentist. At home, brush  your teeth with a soft toothbrush. Be gentle when you floss. Contact a doctor if:  You are dizzy.  You have mild cramps or pressure in your lower belly.  You have a nagging pain in your belly area.  You continue to feel sick to your stomach, you throw up, or you have watery poop (diarrhea).  You have a bad smelling fluid coming from your vagina.  You have pain when you pee (urinate).  You have increased puffiness (swelling) in your face, hands, legs, or ankles. Get help right away if:  You have a fever.  You are leaking fluid from your vagina.  You have spotting or bleeding from your vagina.  You have very bad belly cramping or pain.  You gain or lose weight rapidly.  You throw up blood. It may look like coffee grounds.  You are around people who have German measles, fifth disease, or chickenpox.  You have a very bad headache.  You have shortness of breath.  You have any kind of trauma, such as from a fall or a car accident. Summary  The first trimester of pregnancy is from week 1 until the end of week 13 (months 1 through 3).  To take care of yourself and your unborn baby, you will need to eat healthy meals, take medicines only if your doctor tells you to do so, and do activities that are safe for you and your baby.  Keep all follow-up visits as told by your doctor. This is important as your doctor will have to ensure that your baby is healthy and growing well. This information is not intended to replace advice given to you by your health care provider. Make sure you discuss any questions you have with your health care provider. Document Released: 05/25/2008 Document Revised: 12/15/2016 Document Reviewed: 12/15/2016 Elsevier Interactive Patient Education  2017 Elsevier Inc.  

## 2018-10-19 NOTE — Progress Notes (Signed)
NOB  2007 pt noted she had GDM delivered at 36 wks pt states she had low fluid during pregnancy in" 07" .  Pt states she had U/S to confirm dating at [facility]   Last pap:2018 WNL   Genetic Screening: Desires   CC: nausea and vomiting pt cannot hold down any solids cannot drink water only juice very sensitve to smells as well   Pt feels that she may have a yeast infection. Notices white vaginal discharge and itching  Declines FLU vaccine

## 2018-10-19 NOTE — Progress Notes (Signed)
Subjective:   Sylvia Thompson is a 32 y.o. [id number] at [redacted]w[redacted]d by LMP c/w early [place] at [facility] being seen today for her first obstetrical visit.  Her obstetrical history is significant for nothing. Patient unsure whether she  intend to breast feed. Pregnancy history fully reviewed.  Patient reports nausea and vomiting.  HISTORY: OB History  Gravida Para Term Preterm AB Living  6 3 2 1 2 3   SAB TAB Ectopic Multiple Live Births  2 0 0 0 3    # Outcome Date GA Lbr Len/2nd Weight Sex Delivery Anes PTL Lv  6 Current           5 Term 07/07/14 [redacted]w[redacted]d   M Vag-Spont EPI N LIV  4 Term 12/11/12    M Vag-Spont EPI N LIV  3 Preterm 06/12/06 [redacted]w[redacted]d   F Vag-Spont EPI Y LIV  2 SAB           1 SAB             Last pap smear was done 07/08/2017 and was abnormal - positive for HPV  Past Medical History:  Diagnosis Date  . Blindness, legal   . Cataract   . Gestational diabetes    2007  . Glaucoma   . Prosthetic eye globe    Past Surgical History:  Procedure Laterality Date  . DILATION AND CURETTAGE OF UTERUS    . EYE SURGERY     prosthetic right eye   Family History  Problem Relation Age of Onset  . Diabetes Mother   . Arthritis Sister    Social History   Tobacco Use  . Smoking status: Never Smoker  . Smokeless tobacco: Never Used  Substance Use Topics  . Alcohol use: Not Currently  . Drug use: No   Allergies  Allergen Reactions  . Latex Rash   Current Outpatient Medications on File Prior to Visit  Medication Sig Dispense Refill  . albuterol (PROAIR HFA) 108 (90 Base) MCG/ACT inhaler Inhale into the lungs every 6 (six) hours as needed for wheezing or shortness of breath.    . brimonidine (ALPHAGAN P) 0.1 % SOLN Place 1 drop into the left eye 2 (two) times daily.    . dorzolamide (TRUSOPT) 2 % ophthalmic solution Place 1 drop into both eyes 2 (two) times daily.    . Brinzolamide-Brimonidine (SIMBRINZA OP) Place 1 drop into the left eye at bedtime.    . cetirizine  (ZYRTEC) 10 MG tablet Take 1 tablet (10 mg total) by mouth daily. (Patient not taking: Reported on 04/22/2018) 14 tablet 0  . hydrochlorothiazide (HYDRODIURIL) 12.5 MG tablet Take 1 tablet (12.5 mg total) by mouth daily. (Patient not taking: Reported on 03/10/2018) 30 tablet 2  . metroNIDAZOLE (FLAGYL) 500 MG tablet Take 1 tablet (500 mg total) by mouth 2 (two) times daily. (Patient not taking: Reported on 10/19/2018) 14 tablet 0   No current facility-administered medications on file prior to visit.     Review of Systems Pertinent items noted in HPI and remainder of comprehensive ROS otherwise negative.  Exam   Vitals:   10/19/18 1336  BP: 128/76  Pulse: 80  Weight: 149 lb 12.8 oz (67.9 kg)   Fetal Heart Rate (bpm): 162  Pelvic Exam: Perineum: no hemorrhoids, normal perineum   Vulva: normal external genitalia, no lesions   Vagina:  normal mucosa, normal discharge   Cervix: no lesions and normal, pap smear done, friable cervix with scant bleeding after  pap.    Adnexa: normal adnexa and no mass, fullness, tenderness   Bony Pelvis: average  System: General: well-developed, well-nourished female in no acute distress   Breasts:  normal appearance, no masses or tenderness bilaterally   Skin: normal coloration and turgor, no rashes   Neurologic: oriented, normal, negative, normal mood   Extremities: normal strength, tone, and muscle mass, ROM of all joints is normal   HEENT PERRLA, extraocular movement intact and sclera clear   Mouth/Teeth mucous membranes moist, pharynx normal without lesions and dental hygiene good   Neck supple and no masses   Cardiovascular: regular rate and rhythm   Respiratory:  no respiratory distress, normal breath sounds   Abdomen: soft, non-tender; bowel sounds normal; no masses,  no organomegaly   Assessment:   Pregnancy: [id number] Patient Active Problem List   Diagnosis Date Noted  . Supervision of other normal pregnancy, antepartum 10/19/2018  . Nausea  and vomiting during pregnancy prior to [redacted] weeks gestation 10/19/2018  . 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 Blindness, legal      Plan:  1. Encounter for supervision of other normal pregnancy in first trimester - Patient doing well, other than complaints of N/V  - Desires genetic screening  - History of delivery at 36 weeks, was induced due to oligo in 2007 - Obstetric Panel, Including HIV - Culture, OB Urine - Cytology - PAP - Genetic Screening - Hemoglobinopathy evaluation - Cystic Fibrosis Mutation 97 - SMN1 COPY NUMBER ANALYSIS (SMA Carrier Screen)  2. Vaginal discharge during pregnancy, antepartum - Reports white vaginal discharge and itching for the past 2 weeks, has tried OTC monistat with no relief after medication  - Screening for BV and yeast added to Pap   3. Nausea and vomiting during pregnancy prior to [redacted] weeks gestation - Reports being unable to hold solids down, can keep some liquid/juice down - ondansetron (ZOFRAN ODT) 4 MG disintegrating tablet; Take 1 tablet (4 mg total) by mouth every 8 (eight) hours as needed for nausea or vomiting.  Dispense: 30 tablet; Refill: 2   Initial labs drawn. Continue prenatal vitamins. Genetic Screening discussed, NIPS: ordered. Ultrasound discussed; fetal anatomic survey: requested. Problem list reviewed and updated. The nature of [facility] with multiple MDs and other Advanced Practice Providers was explained to patient; also emphasized that residents, students are part of our team. Routine obstetric precautions reviewed. Return in about 4 weeks (around 11/16/2018) for ROB.   Sharyon Cable, CNM [facility] for [employer], [facility]

## 2018-10-21 LAB — CYTOLOGY - PAP
Bacterial vaginitis: POSITIVE — AB
Candida vaginitis: POSITIVE — AB
Chlamydia: NEGATIVE
Diagnosis: NEGATIVE
HPV: NOT DETECTED
Neisseria Gonorrhea: NEGATIVE
Trichomonas: POSITIVE — AB

## 2018-10-22 LAB — OBSTETRIC PANEL, INCLUDING HIV
Antibody Screen: NEGATIVE
Basophils Absolute: 0 10*3/uL (ref 0.0–0.2)
Basos: 0 %
EOS (ABSOLUTE): 0.2 10*3/uL (ref 0.0–0.4)
Eos: 3 %
HIV Screen 4th Generation wRfx: NONREACTIVE
Hematocrit: 35 % (ref 34.0–46.6)
Hemoglobin: 11.5 g/dL (ref 11.1–15.9)
Hepatitis B Surface Ag: NEGATIVE
Immature Grans (Abs): 0 10*3/uL (ref 0.0–0.1)
Immature Granulocytes: 0 %
Lymphocytes Absolute: 1.3 10*3/uL (ref 0.7–3.1)
Lymphs: 20 %
MCH: 29.1 pg (ref 26.6–33.0)
MCHC: 32.9 g/dL (ref 31.5–35.7)
MCV: 89 fL (ref 79–97)
Monocytes Absolute: 0.5 10*3/uL (ref 0.1–0.9)
Monocytes: 7 %
Neutrophils Absolute: 4.7 10*3/uL (ref 1.4–7.0)
Neutrophils: 70 %
Platelets: 307 10*3/uL (ref 150–450)
RBC: 3.95 x10E6/uL (ref 3.77–5.28)
RDW: 12.4 % (ref 12.3–15.4)
RPR Ser Ql: NONREACTIVE
Rh Factor: POSITIVE
Rubella Antibodies, IGG: 4.45 index (ref >0.99)
WBC: 6.7 10*3/uL (ref 3.4–10.8)

## 2018-10-22 LAB — CULTURE, OB URINE

## 2018-10-22 LAB — HEMOGLOBINOPATHY EVALUATION
HGB C: 0 %
HGB S: 0 %
HGB VARIANT: 0 %
Hemoglobin A2 Quantitation: 2.6 % (ref 1.8–3.2)
Hemoglobin F Quantitation: 1.3 % (ref 0.0–2.0)
Hgb A: 96.1 % — ABNORMAL LOW (ref 96.4–98.8)

## 2018-10-22 LAB — URINE CULTURE, OB REFLEX

## 2018-10-24 MED ORDER — METRONIDAZOLE 500 MG PO TABS: 2000.0000 mg | ORAL_TABLET | Freq: Once | ORAL | 0 refills | Status: AC

## 2018-10-24 MED ORDER — TERCONAZOLE 0.8 % VA CREA: 1.0000 | TOPICAL_CREAM | Freq: Every day | VAGINAL | 0 refills | Status: DC

## 2018-10-24 NOTE — Addendum Note (Signed)
Addended by: Sharyon Cable on: 10/24/2018 08:32 AM   Modules accepted: Orders

## 2018-10-25 ENCOUNTER — Encounter: Payer: Self-pay | Admitting: Certified Nurse Midwife

## 2018-10-27 ENCOUNTER — Telehealth: Payer: Self-pay

## 2018-10-27 LAB — SMN1 COPY NUMBER ANALYSIS (SMA CARRIER SCREENING)

## 2018-10-27 LAB — CYSTIC FIBROSIS MUTATION 97: Interpretation: NOT DETECTED

## 2018-10-27 NOTE — Telephone Encounter (Signed)
Return call to pt regarding results of genetic screening I printed results and had provider review them Pt made aware and voiced understanding.

## 2018-11-02 ENCOUNTER — Encounter: Payer: Self-pay | Admitting: Family Medicine

## 2018-11-02 DIAGNOSIS — O23591 Infection of other part of genital tract in pregnancy, first trimester: Secondary | ICD-10-CM

## 2018-11-02 DIAGNOSIS — Z8632 Personal history of gestational diabetes: Secondary | ICD-10-CM

## 2018-11-02 DIAGNOSIS — Z8751 Personal history of pre-term labor: Secondary | ICD-10-CM | POA: Insufficient documentation

## 2018-11-02 DIAGNOSIS — A5901 Trichomonal vulvovaginitis: Secondary | ICD-10-CM | POA: Insufficient documentation

## 2018-11-02 HISTORY — DX: Personal history of gestational diabetes: Z86.32

## 2018-11-16 ENCOUNTER — Ambulatory Visit (INDEPENDENT_AMBULATORY_CARE_PROVIDER_SITE_OTHER): Payer: Medicare Other | Admitting: Advanced Practice Midwife

## 2018-11-16 ENCOUNTER — Other Ambulatory Visit (HOSPITAL_COMMUNITY)
Admission: RE | Admit: 2018-11-16 | Discharge: 2018-11-16 | Disposition: A | Discharge status: Home / Self Care | Payer: Medicare Other | Source: Ambulatory Visit | Attending: Advanced Practice Midwife | Admitting: Advanced Practice Midwife

## 2018-11-16 VITALS — BP 133/75 | HR 87 | Wt 156.0 lb

## 2018-11-16 DIAGNOSIS — A5901 Trichomonal vulvovaginitis: Secondary | ICD-10-CM | POA: Insufficient documentation

## 2018-11-16 DIAGNOSIS — Z3481 Encounter for supervision of other normal pregnancy, first trimester: Secondary | ICD-10-CM

## 2018-11-16 DIAGNOSIS — O23591 Infection of other part of genital tract in pregnancy, first trimester: Secondary | ICD-10-CM

## 2018-11-16 DIAGNOSIS — O23592 Infection of other part of genital tract in pregnancy, second trimester: Secondary | ICD-10-CM

## 2018-11-16 DIAGNOSIS — Z3A15 15 weeks gestation of pregnancy: Secondary | ICD-10-CM

## 2018-11-16 DIAGNOSIS — O219 Vomiting of pregnancy, unspecified: Secondary | ICD-10-CM

## 2018-11-16 NOTE — Patient Instructions (Signed)

## 2018-11-16 NOTE — Addendum Note (Signed)
Addended by: Anell BarrHOWARD, JENNIFER L on: 11/16/2018 10:46 AM   Modules accepted: Orders

## 2018-11-16 NOTE — Progress Notes (Signed)
   PRENATAL VISIT NOTE  Subjective:  Sylvia Thompson is a 32 y.o. [id number] at 8242w1d being seen today for ongoing prenatal care.  She is currently monitored for the following issues for this low-risk pregnancy and has Blindness, legal; HPV in female; Supervision of other normal pregnancy, antepartum; Nausea and vomiting during pregnancy prior to [redacted] weeks gestation; Trichomonal vaginitis in pregnancy, first trimester; History of preterm delivery- IOL for oligo ; and History of gestational diabetes on their problem list.  Patient reports no complaints.  Contractions: Not present. Vag. Bleeding: None.  Movement: Absent. Denies leaking of fluid.   The following portions of the patient's history were reviewed and updated as appropriate: allergies, current medications, past family history, past medical history, past social history, past surgical history and problem list. Problem list updated.  Objective:   Vitals:   11/16/18 1011  BP: 133/75  Pulse: 87  Weight: 70.8 kg    Fetal Status: Fetal Heart Rate (bpm): 150   Movement: Absent     General:  Alert, oriented and cooperative. Patient is in no acute distress.  Skin: Skin is warm and dry. No rash noted.   Cardiovascular: Normal heart rate noted  Respiratory: Normal respiratory effort, no problems with respiration noted  Abdomen: Soft, gravid, appropriate for gestational age.  Pain/Pressure: Present     Pelvic: Cervical exam deferred        Extremities: Normal range of motion.  Edema: None  Mental Status: Normal mood and affect. Normal behavior. Normal judgment and thought content.   Assessment and Plan:  Pregnancy: [id number] at 2842w1d  1. Encounter for supervision of other normal pregnancy in first trimester --Anticipatory guidance about next visits/weeks of pregnancy given.  2. Nausea and vomiting during pregnancy prior to [redacted] weeks gestation --Pt doing better. Weight gain this visit, able to eat/drink a little more.  3. Trichomonal  vaginitis during pregnancy in first trimester --No s/sx. Partner was not initially treated when pt was positive 06/2018 because he tested negative but he did complete treatment when pt was treated 10/24/18. --TOC today - Cervicovaginal ancillary only( [place])  Preterm labor symptoms and general obstetric precautions including but not limited to vaginal bleeding, contractions, leaking of fluid and fetal movement were reviewed in detail with the patient. Please refer to After Visit Summary for other counseling recommendations.  No follow-ups on file.  Future Appointments  Date Time Provider Department Center  11/16/2018 10:35 AM Leftwich-Kirby, Wilmer FloorLisa A, CNM [facility] None    Sharen CounterLisa Leftwich-Kirby, CNM

## 2018-11-18 LAB — CERVICOVAGINAL ANCILLARY ONLY
Bacterial vaginitis: POSITIVE — AB
CANDIDA VAGINITIS: POSITIVE — AB
Trichomonas: NEGATIVE

## 2018-11-22 ENCOUNTER — Telehealth: Payer: Self-pay

## 2018-11-22 ENCOUNTER — Other Ambulatory Visit: Payer: Self-pay | Admitting: Advanced Practice Midwife

## 2018-11-22 DIAGNOSIS — B373 Candidiasis of vulva and vagina: Secondary | ICD-10-CM

## 2018-11-22 DIAGNOSIS — B3731 Acute candidiasis of vulva and vagina: Secondary | ICD-10-CM

## 2018-11-22 MED ORDER — TERCONAZOLE 0.8 % VA CREA: 1.0000 | TOPICAL_CREAM | Freq: Every day | VAGINAL | 0 refills | Status: DC

## 2018-11-22 MED ORDER — METRONIDAZOLE 500 MG PO TABS: 500.0000 mg | ORAL_TABLET | Freq: Two times a day (BID) | ORAL | 0 refills | Status: DC

## 2018-11-22 NOTE — Progress Notes (Unsigned)
Pt test of cure for trichomonas on 11/16/18 was negative but was positive for yeast and BV.  Rx for terazol 7 and Flagyl 500 mg twice daily x 1 week sent to pt pharmacy.

## 2018-11-22 NOTE — Telephone Encounter (Signed)
Advised pt of results and rx sent.

## 2018-12-05 ENCOUNTER — Encounter (HOSPITAL_COMMUNITY): Payer: Self-pay

## 2018-12-12 ENCOUNTER — Encounter: Payer: Self-pay | Admitting: Advanced Practice Midwife

## 2018-12-12 ENCOUNTER — Other Ambulatory Visit (HOSPITAL_COMMUNITY): Payer: Self-pay | Admitting: *Deleted

## 2018-12-12 ENCOUNTER — Ambulatory Visit (INDEPENDENT_AMBULATORY_CARE_PROVIDER_SITE_OTHER): Payer: Medicare Other | Admitting: Advanced Practice Midwife

## 2018-12-12 ENCOUNTER — Encounter (HOSPITAL_COMMUNITY): Payer: Self-pay

## 2018-12-12 ENCOUNTER — Ambulatory Visit (HOSPITAL_COMMUNITY)
Admission: RE | Admit: 2018-12-12 | Discharge: 2018-12-12 | Disposition: A | Discharge status: Home / Self Care | Payer: Medicare Other | Source: Ambulatory Visit | Attending: Advanced Practice Midwife | Admitting: Advanced Practice Midwife

## 2018-12-12 VITALS — BP 114/76 | HR 79 | Wt 159.0 lb

## 2018-12-12 DIAGNOSIS — O09292 Supervision of pregnancy with other poor reproductive or obstetric history, second trimester: Secondary | ICD-10-CM

## 2018-12-12 DIAGNOSIS — O09212 Supervision of pregnancy with history of pre-term labor, second trimester: Secondary | ICD-10-CM | POA: Diagnosis not present

## 2018-12-12 DIAGNOSIS — Z3481 Encounter for supervision of other normal pregnancy, first trimester: Secondary | ICD-10-CM | POA: Diagnosis present

## 2018-12-12 DIAGNOSIS — O09899 Supervision of other high risk pregnancies, unspecified trimester: Secondary | ICD-10-CM

## 2018-12-12 DIAGNOSIS — O2692 Pregnancy related conditions, unspecified, second trimester: Secondary | ICD-10-CM

## 2018-12-12 DIAGNOSIS — Z363 Encounter for antenatal screening for malformations: Secondary | ICD-10-CM | POA: Diagnosis not present

## 2018-12-12 DIAGNOSIS — O4440 Low lying placenta NOS or without hemorrhage, unspecified trimester: Secondary | ICD-10-CM

## 2018-12-12 DIAGNOSIS — A5901 Trichomonal vulvovaginitis: Secondary | ICD-10-CM | POA: Diagnosis not present

## 2018-12-12 DIAGNOSIS — O23592 Infection of other part of genital tract in pregnancy, second trimester: Secondary | ICD-10-CM | POA: Diagnosis not present

## 2018-12-12 DIAGNOSIS — O43199 Other malformation of placenta, unspecified trimester: Secondary | ICD-10-CM

## 2018-12-12 DIAGNOSIS — Z3A18 18 weeks gestation of pregnancy: Secondary | ICD-10-CM | POA: Insufficient documentation

## 2018-12-12 DIAGNOSIS — Z3480 Encounter for supervision of other normal pregnancy, unspecified trimester: Secondary | ICD-10-CM

## 2018-12-12 DIAGNOSIS — Z36 Encounter for antenatal screening for chromosomal anomalies: Secondary | ICD-10-CM | POA: Diagnosis not present

## 2018-12-12 DIAGNOSIS — O4442 Low lying placenta NOS or without hemorrhage, second trimester: Secondary | ICD-10-CM

## 2018-12-12 DIAGNOSIS — O43192 Other malformation of placenta, second trimester: Secondary | ICD-10-CM

## 2018-12-12 DIAGNOSIS — O09219 Supervision of pregnancy with history of pre-term labor, unspecified trimester: Principal | ICD-10-CM

## 2018-12-12 DIAGNOSIS — Z3482 Encounter for supervision of other normal pregnancy, second trimester: Secondary | ICD-10-CM

## 2018-12-12 DIAGNOSIS — O23591 Infection of other part of genital tract in pregnancy, first trimester: Secondary | ICD-10-CM | POA: Diagnosis present

## 2018-12-12 NOTE — Progress Notes (Signed)
Patient reports fetal movement, denies pain today. 

## 2018-12-12 NOTE — Progress Notes (Signed)
   PRENATAL VISIT NOTE  Subjective:  Sylvia Thompson is a 32 y.o. [id number] at 4783w6d being seen today for ongoing prenatal care.  She is currently monitored for the following issues for this low-risk pregnancy and has Blindness, legal; HPV in female; Supervision of other normal pregnancy, antepartum; Nausea and vomiting during pregnancy prior to [redacted] weeks gestation; Trichomonal vaginitis in pregnancy, first trimester; History of preterm delivery- IOL for oligo ; and History of gestational diabetes on their problem list.  Patient reports no complaints.  Contractions: Not present. Vag. Bleeding: None.  Movement: Present. Denies leaking of fluid.   The following portions of the patient's history were reviewed and updated as appropriate: allergies, current medications, past family history, past medical history, past social history, past surgical history and problem list. Problem list updated.  Objective:   Vitals:   12/12/18 1112  BP: 114/76  Pulse: 79  Weight: 72.1 kg    Fetal Status: Fetal Heart Rate (bpm): 150   Movement: Present     General:  Alert, oriented and cooperative. Patient is in no acute distress.  Skin: Skin is warm and dry. No rash noted.   Cardiovascular: Normal heart rate noted  Respiratory: Normal respiratory effort, no problems with respiration noted  Abdomen: Soft, gravid, appropriate for gestational age.  Pain/Pressure: Absent     Pelvic: Cervical exam deferred        Extremities: Normal range of motion.  Edema: Trace  Mental Status: Normal mood and affect. Normal behavior. Normal judgment and thought content.   Assessment and Plan:  Pregnancy: [id number] at 983w6d  1. Supervision of other normal pregnancy, antepartum --Anticipatory guidance about next visits/weeks of pregnancy given. - AFP, Serum, Open Spina Bifida  2. Low lying placenta without hemorrhage, antepartum --anterior, low-lying. Will likely resolve. No bleeding episodes.  3. Marginal insertion of  umbilical cord affecting management of mother --MFM recommends serial growth US, scheduled.  Preterm labor symptoms and general obstetric precautions including but not limited to vaginal bleeding, contractions, leaking of fluid and fetal movement were reviewed in detail with the patient. Please refer to After Visit Summary for other counseling recommendations.  Return in about 4 weeks (around 01/09/2019).  Future Appointments  Date Time Provider Department Center  01/23/2019 10:30 AM [facility] US 1 [facility]    Sharen CounterLisa Leftwich-Kirby, CNM

## 2018-12-12 NOTE — Patient Instructions (Signed)

## 2018-12-14 LAB — AFP, SERUM, OPEN SPINA BIFIDA
AFP MOM: 1.25
AFP VALUE AFPOSL: 62.2 ng/mL
Gest. Age on Collection Date: 18.6 weeks
Maternal Age At EDD: 34.7 yr
OSBR RISK 1 IN: 10000
Test Results:: NEGATIVE
WEIGHT: 159 [lb_av]

## 2018-12-21 NOTE — L&D Delivery Note (Signed)
Delivery Note At 5:59 PM a viable and healthy female was delivered via Vaginal, Spontaneous (Presentation: ROA ).  APGAR: 9, 9; weight 7 lb 1.2 oz (3210 g).   Placenta status: spontaneous,  intact.  Cord: 3 vessel with the following complications: none.    Anesthesia:  None Episiotomy: None Lacerations: None Suture Repair: n/a Est. Blood Loss (mL): 300  Mom to postpartum.  Baby to Couplet care / Skin to Skin.  Sylvia Thompson is a 33 y.o. female [phone] with IUP at [redacted]w[redacted]d admitted for SOL .  She progressed rapidly to complete and after AROM pushed 2-3 contractions to deliver.  Cord clamping delayed by 1 minute then clamped by CNM and cut by CNM.  Placenta intact and spontaneous, bleeding minimal.  Intact perineum.  Mom and baby stable prior to transfer to postpartum. She plans on breastfeeding. She is undecided about birth control.    Sharen Counter 05/04/2019, 7:37 PM

## 2019-01-01 ENCOUNTER — Encounter (HOSPITAL_COMMUNITY): Payer: Self-pay | Admitting: *Deleted

## 2019-01-01 ENCOUNTER — Inpatient Hospital Stay (HOSPITAL_COMMUNITY)
Admission: AD | Admit: 2019-01-01 | Discharge: 2019-01-01 | Disposition: A | Discharge status: Home / Self Care | Payer: Medicare Other | Source: Ambulatory Visit | Attending: Family Medicine | Admitting: Family Medicine

## 2019-01-01 DIAGNOSIS — B373 Candidiasis of vulva and vagina: Secondary | ICD-10-CM | POA: Diagnosis not present

## 2019-01-01 DIAGNOSIS — Z8751 Personal history of pre-term labor: Secondary | ICD-10-CM

## 2019-01-01 DIAGNOSIS — A5901 Trichomonal vulvovaginitis: Secondary | ICD-10-CM | POA: Diagnosis not present

## 2019-01-01 DIAGNOSIS — O43199 Other malformation of placenta, unspecified trimester: Secondary | ICD-10-CM

## 2019-01-01 DIAGNOSIS — O98812 Other maternal infectious and parasitic diseases complicating pregnancy, second trimester: Secondary | ICD-10-CM | POA: Diagnosis not present

## 2019-01-01 DIAGNOSIS — Z3492 Encounter for supervision of normal pregnancy, unspecified, second trimester: Secondary | ICD-10-CM

## 2019-01-01 DIAGNOSIS — O23591 Infection of other part of genital tract in pregnancy, first trimester: Secondary | ICD-10-CM | POA: Diagnosis not present

## 2019-01-01 DIAGNOSIS — O4440 Low lying placenta NOS or without hemorrhage, unspecified trimester: Secondary | ICD-10-CM

## 2019-01-01 DIAGNOSIS — B3731 Acute candidiasis of vulva and vagina: Secondary | ICD-10-CM

## 2019-01-01 DIAGNOSIS — Z3A21 21 weeks gestation of pregnancy: Secondary | ICD-10-CM | POA: Diagnosis not present

## 2019-01-01 DIAGNOSIS — Z3480 Encounter for supervision of other normal pregnancy, unspecified trimester: Secondary | ICD-10-CM

## 2019-01-01 DIAGNOSIS — M545 Low back pain: Secondary | ICD-10-CM | POA: Diagnosis present

## 2019-01-01 LAB — URINALYSIS, ROUTINE W REFLEX MICROSCOPIC
Bilirubin Urine: NEGATIVE
Glucose, UA: NEGATIVE mg/dL
KETONES UR: NEGATIVE mg/dL
NITRITE: NEGATIVE
Protein, ur: NEGATIVE mg/dL
Specific Gravity, Urine: 1.001 — ABNORMAL LOW (ref 1.005–1.030)
pH: 7 (ref 5.0–8.0)

## 2019-01-01 LAB — WET PREP, GENITAL
Clue Cells Wet Prep HPF POC: NONE SEEN
Sperm: NONE SEEN
Trich, Wet Prep: NONE SEEN

## 2019-01-01 MED ORDER — TERCONAZOLE 0.8 % VA CREA: 1.0000 | TOPICAL_CREAM | Freq: Every day | VAGINAL | 0 refills | Status: DC

## 2019-01-01 NOTE — MAU Note (Signed)
Since Sat have been having some lower back pain. This am having lower abd pain. Went to BR and saw brown streak on pad. Hx low lying placenta.

## 2019-01-01 NOTE — MAU Provider Note (Signed)
History     CSN: [id number]  Arrival date and time: 01/01/19 0700   First Provider Initiated Contact with Patient 01/01/19 [phone]      No chief complaint on file.  HPI Sylvia Thompson is a 33 y.o. [id number] at [redacted]w[redacted]d who presents to MAU with chief complaint of brown vaginal discharge and lower back pain. These are new problems, onset yesterday. She denies vaginal bleeding, leaking of fluid, decreased fetal movement, fever, falls, or recent illness.    Low back pain Patient endorses pain at L5 which radiates to her bilateral lower abdomen. She endorses a low level of physical exertion yesterday, denies sexual intercourse. Pain is 5/10, no aggravating or alleviating factors. She has not taken medication or tried other treatments "I don't like pills" and is declining pain medication upon initial assessment.  Vaginal discharge Patient endorses dark brown vaginal discharge first noted when she voided yesterday. Denies continuous bleeding since that time.   OB History    Gravida  6   Para  3   Term  2   Preterm  1   AB  2   Living  3     SAB  2   TAB      Ectopic      Multiple      Live Births  3           Past Medical History:  Diagnosis Date  . Blindness, legal   . Cataract   . Gestational diabetes    2007  . Glaucoma   . Prosthetic eye globe     Past Surgical History:  Procedure Laterality Date  . DILATION AND CURETTAGE OF UTERUS    . EYE SURGERY     prosthetic right eye    Family History  Problem Relation Age of Onset  . Diabetes Mother   . Arthritis Sister     Social History   Tobacco Use  . Smoking status: Never Smoker  . Smokeless tobacco: Never Used  Substance Use Topics  . Alcohol use: Not Currently  . Drug use: No    Allergies:  Allergies  Allergen Reactions  . Latex Rash    Medications Prior to Admission  Medication Sig Dispense Refill Last Dose  . Brinzolamide-Brimonidine (SIMBRINZA OP) Place 1 drop into the left eye at  bedtime.   Past Week at Unknown time  . dorzolamide (TRUSOPT) 2 % ophthalmic solution Place 1 drop into both eyes 2 (two) times daily.   Past Week at Unknown time  . Prenatal Multivit-Min-Fe-FA (PRENATAL VITAMINS PO) Take by mouth.   Past Week at Unknown time  . albuterol (PROAIR HFA) 108 (90 Base) MCG/ACT inhaler Inhale into the lungs every 6 (six) hours as needed for wheezing or shortness of breath.   More than a month at Unknown time  . brimonidine (ALPHAGAN P) 0.1 % SOLN Place 1 drop into the left eye 2 (two) times daily.   More than a month at Unknown time  . cetirizine (ZYRTEC) 10 MG tablet Take 1 tablet (10 mg total) by mouth daily. (Patient not taking: Reported on 04/22/2018) 14 tablet 0 Not Taking  . hydrochlorothiazide (HYDRODIURIL) 12.5 MG tablet Take 1 tablet (12.5 mg total) by mouth daily. (Patient not taking: Reported on 03/10/2018) 30 tablet 2 More than a month at Unknown time  . metroNIDAZOLE (FLAGYL) 500 MG tablet Take 1 tablet (500 mg total) by mouth 2 (two) times daily. (Patient not taking: Reported on 12/12/2018) 14 tablet 0 More  than a month at Unknown time  . ondansetron (ZOFRAN ODT) 4 MG disintegrating tablet Take 1 tablet (4 mg total) by mouth every 8 (eight) hours as needed for nausea or vomiting. (Patient not taking: Reported on 12/12/2018) 30 tablet 2 More than a month at Unknown time  . terconazole (TERAZOL 3) 0.8 % vaginal cream Place 1 applicator vaginally at bedtime. (Patient not taking: Reported on 12/12/2018) 20 g 0 More than a month at Unknown time    Review of Systems  Constitutional: Negative for diaphoresis, fatigue and fever.  Gastrointestinal: Positive for abdominal pain.  Genitourinary: Positive for vaginal bleeding. Negative for vaginal discharge and vaginal pain.  Musculoskeletal: Positive for back pain.  Neurological: Negative for headaches.  All other systems reviewed and are negative.  Physical Exam   Blood pressure 121/68, pulse 81, temperature 97.8 F  (36.6 C), resp. rate 18, height 5' (1.524 m), weight 73 kg, last menstrual period 08/02/2018.  Physical Exam  Nursing note and vitals reviewed. Constitutional: She is oriented to person, place, and time. She appears well-developed and well-nourished.  Respiratory: Effort normal.  GI: She exhibits no distension. There is no abdominal tenderness. There is no rebound, no guarding and no CVA tenderness.  Gravid  Genitourinary:    Vaginal discharge present.     Genitourinary Comments: Thick greenish white clumped discharge visible throughout vaginal vault. Removed with fox swabs x 5. No bleeding or other discharge noted.   Neurological: She is alert and oriented to person, place, and time.  Skin: Skin is warm and dry.  Psychiatric: She has a normal mood and affect. Her behavior is normal. Judgment and thought content normal.    MAU Course/MDM  Procedures: sterile speculum exam  --Patient declines antibiotics today. Prefers to wait for urine culture results  Patient Vitals for the past 24 hrs:  BP Temp Pulse Resp Height Weight  01/01/19 0716 121/68 - 81 - - -  01/01/19 0714 - 97.8 F (36.6 C) - 18 5' (1.524 m) 73 kg    Results for orders placed or performed during the hospital encounter of 01/01/19 (from the past 24 hour(s))  Wet prep, genital     Status: Abnormal   Collection Time: 01/01/19  7:26 AM  Result Value Ref Range   Yeast Wet Prep HPF POC PRESENT (A) NONE SEEN   Trich, Wet Prep NONE SEEN NONE SEEN   Clue Cells Wet Prep HPF POC NONE SEEN NONE SEEN   WBC, Wet Prep HPF POC MANY (A) NONE SEEN   Sperm NONE SEEN   Urinalysis, Routine w reflex microscopic     Status: Abnormal   Collection Time: 01/01/19  7:26 AM  Result Value Ref Range   Color, Urine STRAW (A) YELLOW   APPearance HAZY (A) CLEAR   Specific Gravity, Urine 1.001 (L) 1.005 - 1.030   pH 7.0 5.0 - 8.0   Glucose, UA NEGATIVE NEGATIVE mg/dL   Hgb urine dipstick MODERATE (A) NEGATIVE   Bilirubin Urine NEGATIVE  NEGATIVE   Ketones, ur NEGATIVE NEGATIVE mg/dL   Protein, ur NEGATIVE NEGATIVE mg/dL   Nitrite NEGATIVE NEGATIVE   Leukocytes, UA LARGE (A) NEGATIVE   RBC / HPF 0-5 0 - 5 RBC/hpf   WBC, UA 11-20 0 - 5 WBC/hpf   Bacteria, UA RARE (A) NONE SEEN   Squamous Epithelial / LPF 0-5 0 - 5    Meds ordered this encounter  Medications  . terconazole (TERAZOL 3) 0.8 % vaginal cream    Sig:  Place 1 applicator vaginally at bedtime. Apply nightly for three nights.    Dispense:  20 g    Refill:  0    Order Specific Question:   Supervising Provider    Answer:   Samara Snide    Assessment and Plan  --33 y.o. [id number] at [redacted]w[redacted]d  --FHT 154 by Doppler --Yeast infection, rx to pharmacy --Discharge home in stable condition  F/U: [facility] 01/09/2019  Calvert Cantor 01/01/2019, 8:08 AM

## 2019-01-01 NOTE — Discharge Instructions (Signed)
Vaginal Yeast infection, Adult    Vaginal yeast infection is a condition that causes vaginal discharge as well as soreness, swelling, and redness (inflammation) of the vagina. This is a common condition. Some women get this infection frequently.  What are the causes?  This condition is caused by a change in the normal balance of the yeast (candida) and bacteria that live in the vagina. This change causes an overgrowth of yeast, which causes the inflammation.  What increases the risk?  The condition is more likely to develop in women who:   Take antibiotic medicines.   Have diabetes.   Take birth control pills.   Are pregnant.   Douche often.   Have a weak body defense system (immune system).   Have been taking steroid medicines for a long time.   Frequently wear tight clothing.  What are the signs or symptoms?  Symptoms of this condition include:   White, thick, creamy vaginal discharge.   Swelling, itching, redness, and irritation of the vagina. The lips of the vagina (vulva) may be affected as well.   Pain or a burning feeling while urinating.   Pain during sex.  How is this diagnosed?  This condition is diagnosed based on:   Your medical history.   A physical exam.   A pelvic exam. Your health care provider will examine a sample of your vaginal discharge under a microscope. Your health care provider may send this sample for testing to confirm the diagnosis.  How is this treated?  This condition is treated with medicine. Medicines may be over-the-counter or prescription. You may be told to use one or more of the following:   Medicine that is taken by mouth (orally).   Medicine that is applied as a cream (topically).   Medicine that is inserted directly into the vagina (suppository).  Follow these instructions at home:    Lifestyle   Do not have sex until your health care provider approves. Tell your sex partner that you have a yeast infection. That person should go to his or her health care  provider and ask if they should also be treated.   Do not wear tight clothes, such as pantyhose or tight pants.   Wear breathable cotton underwear.  General instructions   Take or apply over-the-counter and prescription medicines only as told by your health care provider.   Eat more yogurt. This may help to keep your yeast infection from returning.   Do not use tampons until your health care provider approves.   Try taking a sitz bath to help with discomfort. This is a warm water bath that is taken while you are sitting down. The water should only come up to your hips and should cover your buttocks. Do this 3-4 times per day or as told by your health care provider.   Do not douche.   If you have diabetes, keep your blood sugar levels under control.   Keep all follow-up visits as told by your health care provider. This is important.  Contact a health care provider if:   You have a fever.   Your symptoms go away and then return.   Your symptoms do not get better with treatment.   Your symptoms get worse.   You have new symptoms.   You develop blisters in or around your vagina.   You have blood coming from your vagina and it is not your menstrual period.   You develop pain in your abdomen.  Summary     Vaginal yeast infection is a condition that causes discharge as well as soreness, swelling, and redness (inflammation) of the vagina.   This condition is treated with medicine. Medicines may be over-the-counter or prescription.   Take or apply over-the-counter and prescription medicines only as told by your health care provider.   Do not douche. Do not have sex or use tampons until your health care provider approves.   Contact a health care provider if your symptoms do not get better with treatment or your symptoms go away and then return.  This information is not intended to replace advice given to you by your health care provider. Make sure you discuss any questions you have with your health care  provider.  Document Released: 09/16/2005 Document Revised: 04/25/2018 Document Reviewed: 04/25/2018  Elsevier Interactive Patient Education  2019 [employer]

## 2019-01-03 ENCOUNTER — Other Ambulatory Visit: Payer: Self-pay | Admitting: Advanced Practice Midwife

## 2019-01-03 DIAGNOSIS — A749 Chlamydial infection, unspecified: Secondary | ICD-10-CM

## 2019-01-03 HISTORY — DX: Chlamydial infection, unspecified: A74.9

## 2019-01-03 LAB — CULTURE, OB URINE

## 2019-01-03 LAB — GC/CHLAMYDIA PROBE AMP (~~LOC~~) NOT AT ARMC
Chlamydia: POSITIVE — AB
Neisseria Gonorrhea: NEGATIVE

## 2019-01-03 MED ORDER — AZITHROMYCIN 500 MG PO TABS: 1000.0000 mg | ORAL_TABLET | Freq: Once | ORAL | 0 refills | Status: AC

## 2019-01-03 NOTE — Progress Notes (Signed)
Positive Chlamydia MAU visit. Rx to pharmacy. Patient messaged via mychart. Clayton Bibles, CNM 01/03/19 1:22 PM

## 2019-01-09 ENCOUNTER — Ambulatory Visit (INDEPENDENT_AMBULATORY_CARE_PROVIDER_SITE_OTHER): Payer: Medicare Other | Admitting: Advanced Practice Midwife

## 2019-01-09 VITALS — BP 124/73 | HR 91 | Wt 162.0 lb

## 2019-01-09 DIAGNOSIS — A749 Chlamydial infection, unspecified: Secondary | ICD-10-CM

## 2019-01-09 DIAGNOSIS — O4440 Low lying placenta NOS or without hemorrhage, unspecified trimester: Secondary | ICD-10-CM

## 2019-01-09 DIAGNOSIS — O98812 Other maternal infectious and parasitic diseases complicating pregnancy, second trimester: Secondary | ICD-10-CM

## 2019-01-09 DIAGNOSIS — O43192 Other malformation of placenta, second trimester: Secondary | ICD-10-CM

## 2019-01-09 DIAGNOSIS — Z3480 Encounter for supervision of other normal pregnancy, unspecified trimester: Secondary | ICD-10-CM

## 2019-01-09 DIAGNOSIS — O4442 Low lying placenta NOS or without hemorrhage, second trimester: Secondary | ICD-10-CM

## 2019-01-09 DIAGNOSIS — O43199 Other malformation of placenta, unspecified trimester: Secondary | ICD-10-CM

## 2019-01-09 MED ORDER — AZITHROMYCIN 500 MG PO TABS: 1000.0000 mg | ORAL_TABLET | Freq: Once | ORAL | 1 refills | Status: AC

## 2019-01-09 NOTE — Patient Instructions (Signed)

## 2019-01-09 NOTE — Progress Notes (Signed)
   PRENATAL VISIT NOTE  Subjective:  Sylvia Thompson is a 33 y.o. [id number] at [redacted]w[redacted]d being seen today for ongoing prenatal care.  She is currently monitored for the following issues for this low-risk pregnancy and has Blindness, legal; HPV in female; Supervision of other normal pregnancy, antepartum; Nausea and vomiting during pregnancy prior to [redacted] weeks gestation; Trichomonal vaginitis in pregnancy, first trimester; History of preterm delivery- IOL for oligo ; History of gestational diabetes; Low lying placenta without hemorrhage, antepartum; Marginal insertion of umbilical cord affecting management of mother; and Chlamydia on their problem list.  Patient reports no complaints.  Contractions: Not present. Vag. Bleeding: None.  Movement: Present. Denies leaking of fluid.   The following portions of the patient's history were reviewed and updated as appropriate: allergies, current medications, past family history, past medical history, past social history, past surgical history and problem list. Problem list updated.  Objective:   Vitals:   01/09/19 0959  BP: 124/73  Pulse: 91  Weight: 73.5 kg    Fetal Status: Fetal Heart Rate (bpm): 155   Movement: Present     General:  Alert, oriented and cooperative. Patient is in no acute distress.  Skin: Skin is warm and dry. No rash noted.   Cardiovascular: Normal heart rate noted  Respiratory: Normal respiratory effort, no problems with respiration noted  Abdomen: Soft, gravid, appropriate for gestational age.  Pain/Pressure: Absent     Pelvic: Cervical exam deferred        Extremities: Normal range of motion.     Mental Status: Normal mood and affect. Normal behavior. Normal judgment and thought content.   Assessment and Plan:  Pregnancy: [id number] at [redacted]w[redacted]d  1. Supervision of other normal pregnancy, antepartum --Anticipatory guidance about next visits/weeks of pregnancy given.  2. Low lying placenta without hemorrhage, antepartum  3. Marginal  insertion of umbilical cord affecting management of mother --Serial growth [place]  4. Chlamydia infection affecting pregnancy in second trimester --Positive and treated on 1/12. Needs TOC at next visit.  --Pt doing well, no more symptoms of cramping or pain since her treatment. --Rx written for expedited partner therapy.  No intercourse with partner since pt tx on 1/12.   Preterm labor symptoms and general obstetric precautions including but not limited to vaginal bleeding, contractions, leaking of fluid and fetal movement were reviewed in detail with the patient. Please refer to After Visit Summary for other counseling recommendations.  No follow-ups on file.  Future Appointments  Date Time Provider Department Center  01/23/2019 10:30 AM [facility] [place] 1 [facility]  02/07/2019  9:00 AM [facility] None  02/07/2019  9:15 AM Leftwich-Kirby, Wilmer Floor, CNM [facility] None    Sharen Counter, CNM

## 2019-01-23 ENCOUNTER — Encounter (HOSPITAL_COMMUNITY): Payer: Self-pay

## 2019-01-23 ENCOUNTER — Ambulatory Visit (HOSPITAL_COMMUNITY)
Admission: RE | Admit: 2019-01-23 | Discharge: 2019-01-23 | Disposition: A | Discharge status: Home / Self Care | Payer: Medicare Other | Source: Ambulatory Visit | Attending: Advanced Practice Midwife | Admitting: Advanced Practice Midwife

## 2019-01-23 ENCOUNTER — Other Ambulatory Visit (HOSPITAL_COMMUNITY): Payer: Self-pay | Admitting: *Deleted

## 2019-01-23 DIAGNOSIS — O43199 Other malformation of placenta, unspecified trimester: Secondary | ICD-10-CM

## 2019-01-23 DIAGNOSIS — O09292 Supervision of pregnancy with other poor reproductive or obstetric history, second trimester: Secondary | ICD-10-CM

## 2019-01-23 DIAGNOSIS — O09212 Supervision of pregnancy with history of pre-term labor, second trimester: Secondary | ICD-10-CM

## 2019-01-23 DIAGNOSIS — Z3A24 24 weeks gestation of pregnancy: Secondary | ICD-10-CM | POA: Diagnosis not present

## 2019-01-23 DIAGNOSIS — O09219 Supervision of pregnancy with history of pre-term labor, unspecified trimester: Secondary | ICD-10-CM | POA: Insufficient documentation

## 2019-01-23 DIAGNOSIS — O4440 Low lying placenta NOS or without hemorrhage, unspecified trimester: Secondary | ICD-10-CM

## 2019-01-23 DIAGNOSIS — O09899 Supervision of other high risk pregnancies, unspecified trimester: Secondary | ICD-10-CM

## 2019-02-07 ENCOUNTER — Encounter: Payer: Self-pay | Admitting: Obstetrics

## 2019-02-07 ENCOUNTER — Other Ambulatory Visit: Payer: Medicare Other

## 2019-02-07 ENCOUNTER — Ambulatory Visit (INDEPENDENT_AMBULATORY_CARE_PROVIDER_SITE_OTHER): Payer: Medicare Other | Admitting: Obstetrics

## 2019-02-07 DIAGNOSIS — Z3A27 27 weeks gestation of pregnancy: Secondary | ICD-10-CM

## 2019-02-07 DIAGNOSIS — Z3480 Encounter for supervision of other normal pregnancy, unspecified trimester: Secondary | ICD-10-CM

## 2019-02-07 DIAGNOSIS — Z3482 Encounter for supervision of other normal pregnancy, second trimester: Secondary | ICD-10-CM

## 2019-02-07 NOTE — Progress Notes (Signed)
Subjective:  Sylvia Thompson is a 33 y.o. [id number] at [redacted]w[redacted]d being seen today for ongoing prenatal care.  She is currently monitored for the following issues for this low-risk pregnancy and has Blindness, legal; HPV in female; Supervision of other normal pregnancy, antepartum; Nausea and vomiting during pregnancy prior to [redacted] weeks gestation; Trichomonal vaginitis in pregnancy, first trimester; History of preterm delivery- IOL for oligo ; History of gestational diabetes; Low lying placenta without hemorrhage, antepartum; Marginal insertion of umbilical cord affecting management of mother; and Chlamydia on their problem list.  Patient reports no complaints.  Contractions: Not present. Vag. Bleeding: None.  Movement: Present. Denies leaking of fluid.   The following portions of the patient's history were reviewed and updated as appropriate: allergies, current medications, past family history, past medical history, past social history, past surgical history and problem list. Problem list updated.  Objective:   Vitals:   02/07/19 0947  BP: 125/72  Pulse: 93  Weight: 168 lb 14.4 oz (76.6 kg)    Fetal Status:     Movement: Present     General:  Alert, oriented and cooperative. Patient is in no acute distress.  Skin: Skin is warm and dry. No rash noted.   Cardiovascular: Normal heart rate noted  Respiratory: Normal respiratory effort, no problems with respiration noted  Abdomen: Soft, gravid, appropriate for gestational age. Pain/Pressure: Absent     Pelvic:  Cervical exam deferred        Extremities: Normal range of motion.  Edema: Trace  Mental Status: Normal mood and affect. Normal behavior. Normal judgment and thought content.   Urinalysis:      Assessment and Plan:  Pregnancy: [id number] at [redacted]w[redacted]d  1. Supervision of other normal pregnancy, antepartum Rx: - Glucose Tolerance, 2 Hours w/1 Hour - RPR - CBC - HIV antibody (with reflex)  Preterm labor symptoms and general obstetric  precautions including but not limited to vaginal bleeding, contractions, leaking of fluid and fetal movement were reviewed in detail with the patient. Please refer to After Visit Summary for other counseling recommendations.  Return in about 2 weeks (around 02/21/2019) for ROB.   Brock Bad, MD

## 2019-02-08 LAB — CBC
Hematocrit: 29.5 % — ABNORMAL LOW (ref 34.0–46.6)
Hemoglobin: 9.8 g/dL — ABNORMAL LOW (ref 11.1–15.9)
MCH: 29.3 pg (ref 26.6–33.0)
MCHC: 33.2 g/dL (ref 31.5–35.7)
MCV: 88 fL (ref 79–97)
Platelets: 227 10*3/uL (ref 150–450)
RBC: 3.35 x10E6/uL — ABNORMAL LOW (ref 3.77–5.28)
RDW: 13.2 % (ref 11.7–15.4)
WBC: 8.9 10*3/uL (ref 3.4–10.8)

## 2019-02-08 LAB — RPR: RPR Ser Ql: NONREACTIVE

## 2019-02-08 LAB — GLUCOSE TOLERANCE, 2 HOURS W/ 1HR
Glucose, 1 hour: 143 mg/dL (ref 65–179)
Glucose, 2 hour: 129 mg/dL (ref 65–152)
Glucose, Fasting: 74 mg/dL (ref 65–91)

## 2019-02-08 LAB — HIV ANTIBODY (ROUTINE TESTING W REFLEX): HIV Screen 4th Generation wRfx: NONREACTIVE

## 2019-02-09 ENCOUNTER — Other Ambulatory Visit: Payer: Self-pay | Admitting: Obstetrics

## 2019-02-09 DIAGNOSIS — O99019 Anemia complicating pregnancy, unspecified trimester: Secondary | ICD-10-CM

## 2019-02-09 MED ORDER — FERROUS SULFATE 325 (65 FE) MG PO TABS: 325.0000 mg | ORAL_TABLET | Freq: Two times a day (BID) | ORAL | 5 refills | Status: DC

## 2019-02-21 ENCOUNTER — Ambulatory Visit (HOSPITAL_COMMUNITY)
Admission: RE | Admit: 2019-02-21 | Discharge: 2019-02-21 | Disposition: A | Discharge status: Home / Self Care | Payer: Medicare Other | Source: Ambulatory Visit | Attending: Advanced Practice Midwife | Admitting: Advanced Practice Midwife

## 2019-02-21 ENCOUNTER — Encounter (HOSPITAL_COMMUNITY): Payer: Self-pay

## 2019-02-21 ENCOUNTER — Ambulatory Visit (HOSPITAL_COMMUNITY): Payer: Medicare Other | Admitting: *Deleted

## 2019-02-21 ENCOUNTER — Other Ambulatory Visit (HOSPITAL_COMMUNITY): Payer: Self-pay | Admitting: *Deleted

## 2019-02-21 VITALS — BP 114/55 | HR 87 | Wt 175.4 lb

## 2019-02-21 DIAGNOSIS — O09219 Supervision of pregnancy with history of pre-term labor, unspecified trimester: Principal | ICD-10-CM

## 2019-02-21 DIAGNOSIS — O43199 Other malformation of placenta, unspecified trimester: Secondary | ICD-10-CM | POA: Insufficient documentation

## 2019-02-21 DIAGNOSIS — O09292 Supervision of pregnancy with other poor reproductive or obstetric history, second trimester: Secondary | ICD-10-CM

## 2019-02-21 DIAGNOSIS — O09212 Supervision of pregnancy with history of pre-term labor, second trimester: Secondary | ICD-10-CM

## 2019-02-21 DIAGNOSIS — O09899 Supervision of other high risk pregnancies, unspecified trimester: Secondary | ICD-10-CM

## 2019-02-21 DIAGNOSIS — Z362 Encounter for other antenatal screening follow-up: Secondary | ICD-10-CM

## 2019-02-21 DIAGNOSIS — O4442 Low lying placenta NOS or without hemorrhage, second trimester: Secondary | ICD-10-CM | POA: Diagnosis not present

## 2019-02-21 DIAGNOSIS — Z3A23 23 weeks gestation of pregnancy: Secondary | ICD-10-CM | POA: Diagnosis not present

## 2019-02-21 DIAGNOSIS — O43193 Other malformation of placenta, third trimester: Secondary | ICD-10-CM

## 2019-02-23 ENCOUNTER — Encounter: Payer: Self-pay | Admitting: Certified Nurse Midwife

## 2019-02-23 ENCOUNTER — Other Ambulatory Visit (HOSPITAL_COMMUNITY)
Admission: RE | Admit: 2019-02-23 | Discharge: 2019-02-23 | Disposition: A | Discharge status: Home / Self Care | Payer: Medicare Other | Source: Ambulatory Visit | Attending: Obstetrics and Gynecology | Admitting: Obstetrics and Gynecology

## 2019-02-23 ENCOUNTER — Ambulatory Visit (INDEPENDENT_AMBULATORY_CARE_PROVIDER_SITE_OTHER): Payer: Medicare Other | Admitting: Certified Nurse Midwife

## 2019-02-23 VITALS — BP 115/55 | HR 94 | Wt 173.6 lb

## 2019-02-23 DIAGNOSIS — A749 Chlamydial infection, unspecified: Secondary | ICD-10-CM | POA: Insufficient documentation

## 2019-02-23 DIAGNOSIS — O43199 Other malformation of placenta, unspecified trimester: Secondary | ICD-10-CM

## 2019-02-23 DIAGNOSIS — O43193 Other malformation of placenta, third trimester: Secondary | ICD-10-CM

## 2019-02-23 DIAGNOSIS — Z3480 Encounter for supervision of other normal pregnancy, unspecified trimester: Secondary | ICD-10-CM

## 2019-02-23 DIAGNOSIS — O98812 Other maternal infectious and parasitic diseases complicating pregnancy, second trimester: Secondary | ICD-10-CM | POA: Diagnosis not present

## 2019-02-23 DIAGNOSIS — O98813 Other maternal infectious and parasitic diseases complicating pregnancy, third trimester: Secondary | ICD-10-CM

## 2019-02-23 DIAGNOSIS — Z3A29 29 weeks gestation of pregnancy: Secondary | ICD-10-CM

## 2019-02-23 NOTE — Progress Notes (Signed)
   PRENATAL VISIT NOTE  Subjective:  Sylvia Thompson is a 33 y.o. [id number] at [redacted]w[redacted]d being seen today for ongoing prenatal care.  She is currently monitored for the following issues for this high-risk pregnancy and has Blindness, legal; HPV in female; Supervision of other normal pregnancy, antepartum; Nausea and vomiting during pregnancy prior to [redacted] weeks gestation; Trichomonal vaginitis in pregnancy, first trimester; History of preterm delivery- IOL for oligo ; History of gestational diabetes; Low lying placenta without hemorrhage, antepartum; Marginal insertion of umbilical cord affecting management of mother; and Chlamydia on their problem list.  Patient reports no complaints.  Contractions: Irritability. Vag. Bleeding: None.  Movement: Present. Denies leaking of fluid.   The following portions of the patient's history were reviewed and updated as appropriate: allergies, current medications, past family history, past medical history, past social history, past surgical history and problem list. Problem list updated.  Objective:   Vitals:   02/23/19 1036  BP: (!) 115/55  Pulse: 94  Weight: 173 lb 9.6 oz (78.7 kg)    Fetal Status: Fetal Heart Rate (bpm): 134 Fundal Height: 29 cm Movement: Present    General:  Alert, oriented and cooperative. Patient is in no acute distress.  Skin: Skin is warm and dry. No rash noted.   Cardiovascular: Normal heart rate noted  Respiratory: Normal respiratory effort, no problems with respiration noted  Abdomen: Soft, gravid, appropriate for gestational age.  Pain/Pressure: Present     Pelvic: Cervical exam deferred        Extremities: Normal range of motion.  Edema: Trace  Mental Status: Normal mood and affect. Normal behavior. Normal judgment and thought content.   Assessment and Plan:  Pregnancy: [id number] at [redacted]w[redacted]d  1. Supervision of other normal pregnancy, antepartum - Patient doing well, no complaints  - FH 29 - Anticipatory guidance on upcoming  appointments - Answered patient's questions on MCI and whether that disqualifies her from having vaginal delivery, informed patient that she can have a vaginal delivery with this insertion.   2. Chlamydia infection affecting pregnancy in second trimester - TOC performed today  - Cervicovaginal ancillary only( [place])  3. Marginal insertion of umbilical cord affecting management of mother - f/u US every 4 weeks for fetal growth  - low lying placenta is resolved   Preterm labor symptoms and general obstetric precautions including but not limited to vaginal bleeding, contractions, leaking of fluid and fetal movement were reviewed in detail with the patient. Please refer to After Visit Summary for other counseling recommendations.  Return in about 2 weeks (around 03/09/2019) for ROB.  Future Appointments  Date Time Provider Department Center  03/10/2019 10:30 AM Constant, Gigi Gin, MD [facility] None  03/22/2019 11:00 AM [facility] [place] 3 [facility]    Sharyon Cable, CNM

## 2019-02-23 NOTE — Patient Instructions (Signed)

## 2019-02-24 LAB — CERVICOVAGINAL ANCILLARY ONLY
Chlamydia: NEGATIVE
Neisseria Gonorrhea: NEGATIVE

## 2019-03-10 ENCOUNTER — Ambulatory Visit (INDEPENDENT_AMBULATORY_CARE_PROVIDER_SITE_OTHER): Payer: Medicare Other | Admitting: Obstetrics and Gynecology

## 2019-03-10 ENCOUNTER — Other Ambulatory Visit: Payer: Self-pay

## 2019-03-10 ENCOUNTER — Encounter: Payer: Self-pay | Admitting: Obstetrics and Gynecology

## 2019-03-10 VITALS — BP 126/79 | HR 94 | Wt 173.8 lb

## 2019-03-10 DIAGNOSIS — Z3A31 31 weeks gestation of pregnancy: Secondary | ICD-10-CM

## 2019-03-10 DIAGNOSIS — Z3480 Encounter for supervision of other normal pregnancy, unspecified trimester: Secondary | ICD-10-CM

## 2019-03-10 DIAGNOSIS — O43199 Other malformation of placenta, unspecified trimester: Secondary | ICD-10-CM

## 2019-03-10 DIAGNOSIS — O43193 Other malformation of placenta, third trimester: Secondary | ICD-10-CM

## 2019-03-10 MED ORDER — ALBUTEROL SULFATE HFA 108 (90 BASE) MCG/ACT IN AERS: 2.0000 | INHALATION_SPRAY | Freq: Four times a day (QID) | RESPIRATORY_TRACT | 3 refills | Status: DC | PRN

## 2019-03-10 MED ORDER — FLUCONAZOLE 150 MG PO TABS: 150.0000 mg | ORAL_TABLET | Freq: Once | ORAL | 0 refills | Status: AC

## 2019-03-10 NOTE — Progress Notes (Signed)
ROB.  C/o of being unable to breathe and needs refills on her Inhaler, blurry vision x 2-3 weeks.Marland Kitchen

## 2019-03-10 NOTE — Progress Notes (Signed)
   PRENATAL VISIT NOTE  Subjective:  Sylvia Thompson is a 33 y.o. [phone] at [redacted]w[redacted]d being seen today for ongoing prenatal care.  She is currently monitored for the following issues for this high-risk pregnancy and has Blindness, legal; HPV in female; Supervision of other normal pregnancy, antepartum; Nausea and vomiting during pregnancy prior to [redacted] weeks gestation; Trichomonal vaginitis in pregnancy, first trimester; History of preterm delivery- IOL for oligo ; History of gestational diabetes; Marginal insertion of umbilical cord affecting management of mother; and Chlamydia on their problem list.  Patient reports return of allergies affecting vision and breathing. Patient is requesting refills on inhaler.  Contractions: Irritability. Vag. Bleeding: None.  Movement: Present. Denies leaking of fluid.   The following portions of the patient's history were reviewed and updated as appropriate: allergies, current medications, past family history, past medical history, past social history, past surgical history and problem list.   Objective:   Vitals:   03/10/19 1023  BP: 126/79  Pulse: 94  Weight: 173 lb 12.8 oz (78.8 kg)    Fetal Status: Fetal Heart Rate (bpm): 149 Fundal Height: 31 cm Movement: Present     General:  Alert, oriented and cooperative. Patient is in no acute distress.  Skin: Skin is warm and dry. No rash noted.   Cardiovascular: Normal heart rate noted  Respiratory: Normal respiratory effort, no problems with respiration noted  Abdomen: Soft, gravid, appropriate for gestational age.  Pain/Pressure: Present     Pelvic: Cervical exam deferred        Extremities: Normal range of motion.  Edema: Trace  Mental Status: Normal mood and affect. Normal behavior. Normal judgment and thought content.   Assessment and Plan:  Pregnancy: [id number] at [redacted]w[redacted]d 1. Supervision of other normal pregnancy, antepartum Patient is doing well without complaints Continue iron supplement for correction  of anemia  2. Marginal insertion of umbilical cord affecting management of mother Normal growth on 3/3 Follow up growth ultrasound scheduled  Preterm labor symptoms and general obstetric precautions including but not limited to vaginal bleeding, contractions, leaking of fluid and fetal movement were reviewed in detail with the patient. Please refer to After Visit Summary for other counseling recommendations.   Return in about 2 weeks (around 03/24/2019) for ROB.  Future Appointments  Date Time Provider Department Center  03/22/2019 11:00 AM [facility] [place] 3 [facility]    Catalina Antigua, MD

## 2019-03-17 ENCOUNTER — Inpatient Hospital Stay (HOSPITAL_COMMUNITY)
Admission: AD | Admit: 2019-03-17 | Discharge: 2019-03-17 | Disposition: A | Discharge status: Home / Self Care | Payer: Medicare Other | Attending: Obstetrics and Gynecology | Admitting: Obstetrics and Gynecology

## 2019-03-17 ENCOUNTER — Encounter (HOSPITAL_COMMUNITY): Payer: Self-pay | Admitting: *Deleted

## 2019-03-17 ENCOUNTER — Other Ambulatory Visit: Payer: Self-pay

## 2019-03-17 ENCOUNTER — Telehealth: Payer: Self-pay

## 2019-03-17 DIAGNOSIS — Z79899 Other long term (current) drug therapy: Secondary | ICD-10-CM | POA: Insufficient documentation

## 2019-03-17 DIAGNOSIS — Z3689 Encounter for other specified antenatal screening: Secondary | ICD-10-CM

## 2019-03-17 DIAGNOSIS — Z3A32 32 weeks gestation of pregnancy: Secondary | ICD-10-CM | POA: Insufficient documentation

## 2019-03-17 DIAGNOSIS — N939 Abnormal uterine and vaginal bleeding, unspecified: Secondary | ICD-10-CM | POA: Insufficient documentation

## 2019-03-17 DIAGNOSIS — H409 Unspecified glaucoma: Secondary | ICD-10-CM | POA: Diagnosis not present

## 2019-03-17 DIAGNOSIS — H548 Legal blindness, as defined in USA: Secondary | ICD-10-CM | POA: Diagnosis not present

## 2019-03-17 DIAGNOSIS — O26853 Spotting complicating pregnancy, third trimester: Secondary | ICD-10-CM | POA: Diagnosis not present

## 2019-03-17 DIAGNOSIS — O4693 Antepartum hemorrhage, unspecified, third trimester: Secondary | ICD-10-CM | POA: Insufficient documentation

## 2019-03-17 DIAGNOSIS — N949 Unspecified condition associated with female genital organs and menstrual cycle: Secondary | ICD-10-CM

## 2019-03-17 LAB — URINALYSIS, ROUTINE W REFLEX MICROSCOPIC
Bilirubin Urine: NEGATIVE
Glucose, UA: NEGATIVE mg/dL
Ketones, ur: NEGATIVE mg/dL
NITRITE: NEGATIVE
Protein, ur: NEGATIVE mg/dL
SPECIFIC GRAVITY, URINE: 1.006 (ref 1.005–1.030)
pH: 7 (ref 5.0–8.0)

## 2019-03-17 LAB — WET PREP, GENITAL
Clue Cells Wet Prep HPF POC: NONE SEEN
Sperm: NONE SEEN
Trich, Wet Prep: NONE SEEN
Yeast Wet Prep HPF POC: NONE SEEN

## 2019-03-17 NOTE — Discharge Instructions (Signed)

## 2019-03-17 NOTE — Telephone Encounter (Signed)
TC from patient c/o vaginal bleeding Pt advised to to to MAU for an evaluation.

## 2019-03-17 NOTE — MAU Note (Signed)
Pt presents to MAU with complaints of vaginal bleeding that she noticed earlier today, does not have to wear a pad. Has pelvic pressure

## 2019-03-17 NOTE — MAU Provider Note (Signed)
History     CSN: [id number]  Arrival date and time: 03/17/19 1350    First Provider Initiated Contact with Patient 03/17/19 1420     Chief Complaint  Patient presents with  . Vaginal Bleeding   HPI Sylvia Thompson is a 33 y.o. [id number] at [redacted]w[redacted]d who presents to MAU with chief complaint of vaginal spotting. This is a new problem. Patient states she was shopping at [facility] earlier today, used the restroom, and saw blood on her toilet paper after voiding. She denies ongoing vaginal bleeding. She denies urinary complaints, abdominal cramping, decreased fetal movement, abnormal vaginal discharge, leaking of fluid, fever, falls or recent illness.  Patient reports an additional complaint of vaginal pressure. This is a recurring problem for "about the last two weeks". She is nervous because she did not feel this level of perineal pressure with her other pregnancies.   Patient declines pain medication.  OB History    Gravida  6   Para  3   Term  2   Preterm  1   AB  2   Living  3     SAB  2   TAB      Ectopic      Multiple      Live Births  3           Past Medical History:  Diagnosis Date  . Blindness, legal   . Cataract   . Gestational diabetes    2007  . Glaucoma   . Prosthetic eye globe     Past Surgical History:  Procedure Laterality Date  . DILATION AND CURETTAGE OF UTERUS    . EYE SURGERY     prosthetic right eye    Family History  Problem Relation Age of Onset  . Diabetes Mother   . Arthritis Sister     Social History   Tobacco Use  . Smoking status: Never Smoker  . Smokeless tobacco: Never Used  Substance Use Topics  . Alcohol use: Not Currently  . Drug use: No    Allergies:  Allergies  Allergen Reactions  . Timolol Shortness Of Breath  . Latex Rash    Medications Prior to Admission  Medication Sig Dispense Refill Last Dose  . brimonidine (ALPHAGAN P) 0.1 % SOLN Place 1 drop into the left eye 2 (two) times daily.   03/17/2019 at  Unknown time  . Brinzolamide-Brimonidine (SIMBRINZA OP) Place 1 drop into the left eye at bedtime.   03/17/2019 at Unknown time  . dorzolamide (TRUSOPT) 2 % ophthalmic solution Place 1 drop into both eyes 2 (two) times daily.   03/17/2019 at Unknown time  . ferrous sulfate 325 (65 FE) MG tablet Take 1 tablet (325 mg total) by mouth 2 (two) times daily with a meal. 60 tablet 5 03/17/2019 at Unknown time  . Prenatal Multivit-Min-Fe-FA (PRENATAL VITAMINS PO) Take by mouth.   03/17/2019 at Unknown time  . albuterol (PROAIR HFA) 108 (90 Base) MCG/ACT inhaler Inhale 2 puffs into the lungs every 6 (six) hours as needed for wheezing or shortness of breath. 8 g 3     Review of Systems  Constitutional: Negative for chills, fatigue and fever.  Respiratory: Negative for shortness of breath.   Gastrointestinal: Negative for abdominal pain, nausea and vomiting.  Genitourinary: Positive for vaginal bleeding. Negative for difficulty urinating, dysuria and flank pain.  Musculoskeletal: Negative for back pain.  All other systems reviewed and are negative.  Physical Exam   Blood pressure 136/64,  pulse (!) 105, temperature 98.2 F (36.8 C), resp. rate 16, height 5' (1.524 m), weight 78.8 kg, last menstrual period 08/02/2018, SpO2 99 %.  Physical Exam  Nursing note and vitals reviewed. Constitutional: She is oriented to person, place, and time. She appears well-developed and well-nourished.  Respiratory: Effort normal.  GI: Soft. She exhibits no distension. There is no abdominal tenderness. There is no rebound and no guarding.  Gravid  Genitourinary:    Genitourinary Comments: No bleeding or abnormal discharge noted on SSE   Neurological: She is alert and oriented to person, place, and time.  Skin: Skin is warm and dry.  Psychiatric: She has a normal mood and affect. Her behavior is normal. Judgment and thought content normal.    MAU Course/MDM  Procedures: sterile speculum exam  --Low lying placenta  resolved per MFM --FFN collected, cervix closed/thick/posterior, FFN discarded --Reactive tracing: baseline 135, moderate variability, positive accels, no decels --Toco: rare contraction, uterine irritability noted  Patient Vitals for the past 24 hrs:  BP Temp Pulse Resp SpO2 Height Weight  03/17/19 1551 124/61 - 95 16 - - -  03/17/19 1359 136/64 98.2 F (36.8 C) (!) 105 16 99 % - -  03/17/19 1358 - - - - - 5' (1.524 m) 78.8 kg    Results for orders placed or performed during the hospital encounter of 03/17/19 (from the past 24 hour(s))  Urinalysis, Routine w reflex microscopic     Status: Abnormal   Collection Time: 03/17/19  2:04 PM  Result Value Ref Range   Color, Urine STRAW (A) YELLOW   APPearance HAZY (A) CLEAR   Specific Gravity, Urine 1.006 1.005 - 1.030   pH 7.0 5.0 - 8.0   Glucose, UA NEGATIVE NEGATIVE mg/dL   Hgb urine dipstick MODERATE (A) NEGATIVE   Bilirubin Urine NEGATIVE NEGATIVE   Ketones, ur NEGATIVE NEGATIVE mg/dL   Protein, ur NEGATIVE NEGATIVE mg/dL   Nitrite NEGATIVE NEGATIVE   Leukocytes,Ua TRACE (A) NEGATIVE   RBC / HPF 6-10 0 - 5 RBC/hpf   WBC, UA 0-5 0 - 5 WBC/hpf   Bacteria, UA RARE (A) NONE SEEN   Squamous Epithelial / LPF 6-10 0 - 5  Wet prep, genital     Status: Abnormal   Collection Time: 03/17/19  2:29 PM  Result Value Ref Range   Yeast Wet Prep HPF POC NONE SEEN NONE SEEN   Trich, Wet Prep NONE SEEN NONE SEEN   Clue Cells Wet Prep HPF POC NONE SEEN NONE SEEN   WBC, Wet Prep HPF POC FEW (A) NONE SEEN   Sperm NONE SEEN     Assessment and Plan  --33 y.o. [id number] at [redacted]w[redacted]d  --Reactive tracing, closed cervix --Pubic symphysis discomfort and round ligament pain --Urine culture pending, follow-up PRN. --Discharge home in stable condition, patient declined rx for Flexeril  F/U: 03/27/19 at [facility]  Calvert Cantor, CNM 03/17/2019, 4:20 PM

## 2019-03-19 LAB — CULTURE, OB URINE

## 2019-03-20 LAB — GC/CHLAMYDIA PROBE AMP (~~LOC~~) NOT AT ARMC
Chlamydia: NEGATIVE
Neisseria Gonorrhea: NEGATIVE

## 2019-03-22 ENCOUNTER — Other Ambulatory Visit: Payer: Self-pay

## 2019-03-22 ENCOUNTER — Inpatient Hospital Stay (HOSPITAL_COMMUNITY)
Admission: AD | Admit: 2019-03-22 | Discharge: 2019-03-22 | Disposition: A | Discharge status: Home / Self Care | Payer: Medicare Other | Attending: Obstetrics and Gynecology | Admitting: Obstetrics and Gynecology

## 2019-03-22 ENCOUNTER — Encounter (HOSPITAL_COMMUNITY): Payer: Self-pay | Admitting: *Deleted

## 2019-03-22 ENCOUNTER — Ambulatory Visit (HOSPITAL_COMMUNITY): Payer: Medicare Other

## 2019-03-22 DIAGNOSIS — Z3A33 33 weeks gestation of pregnancy: Secondary | ICD-10-CM | POA: Diagnosis not present

## 2019-03-22 DIAGNOSIS — Z888 Allergy status to other drugs, medicaments and biological substances status: Secondary | ICD-10-CM | POA: Insufficient documentation

## 2019-03-22 DIAGNOSIS — Z8632 Personal history of gestational diabetes: Secondary | ICD-10-CM | POA: Diagnosis not present

## 2019-03-22 DIAGNOSIS — Z8751 Personal history of pre-term labor: Secondary | ICD-10-CM | POA: Diagnosis not present

## 2019-03-22 DIAGNOSIS — Z833 Family history of diabetes mellitus: Secondary | ICD-10-CM | POA: Insufficient documentation

## 2019-03-22 DIAGNOSIS — Z91040 Latex allergy status: Secondary | ICD-10-CM | POA: Diagnosis not present

## 2019-03-22 DIAGNOSIS — O4703 False labor before 37 completed weeks of gestation, third trimester: Secondary | ICD-10-CM | POA: Diagnosis not present

## 2019-03-22 LAB — URINALYSIS, ROUTINE W REFLEX MICROSCOPIC
Bilirubin Urine: NEGATIVE
Glucose, UA: NEGATIVE mg/dL
Ketones, ur: NEGATIVE mg/dL
Nitrite: NEGATIVE
Protein, ur: NEGATIVE mg/dL
Specific Gravity, Urine: 1.004 — ABNORMAL LOW (ref 1.005–1.030)
pH: 7 (ref 5.0–8.0)

## 2019-03-22 LAB — FETAL FIBRONECTIN: Fetal Fibronectin: NEGATIVE

## 2019-03-22 MED ORDER — NIFEDIPINE 10 MG PO CAPS
10.0000 mg | ORAL_CAPSULE | ORAL | Status: DC | PRN
  Administered 2019-03-22 (×2): 10 mg via ORAL
  Filled 2019-03-22 (×2): qty 1

## 2019-03-22 NOTE — MAU Note (Signed)
PT SAYS YESTERDAY - STARTED UC'S.  THEN AT 0100- DRANK WATER- WORSE LAYING.  AT 4 PM-  STILL STRONG .  PNC  WITH FAMINA. LAST SEX- 1 WEEK  AGO.  DENIES HSV AND MRSA

## 2019-03-22 NOTE — Discharge Instructions (Signed)
Preterm Labor and Birth Information °Pregnancy normally lasts 39-41 weeks. Preterm labor is when labor starts early. It starts before you have been pregnant for 37 whole weeks. °What are the risk factors for preterm labor? °Preterm labor is more likely to occur in women who: °· Have an infection while pregnant. °· Have a cervix that is short. °· Have gone into preterm labor before. °· Have had surgery on their cervix. °· Are younger than age 35. °· Are older than age 35. °· Are African American. °· Are pregnant with two or more babies. °· Take street drugs while pregnant. °· Smoke while pregnant. °· Do not gain enough weight while pregnant. °· Got pregnant right after another pregnancy. °What are the symptoms of preterm labor? °Symptoms of preterm labor include: °· Cramps. The cramps may feel like the cramps some women get during their period. The cramps may happen with watery poop (diarrhea). °· Pain in the belly (abdomen). °· Pain in the lower back. °· Regular contractions or tightening. It may feel like your belly is getting tighter. °· Pressure in the lower belly that seems to get stronger. °· More fluid (discharge) leaking from the vagina. The fluid may be watery or bloody. °· Water breaking. °Why is it important to notice signs of preterm labor? °Babies who are born early may not be fully developed. They have a higher chance for: °· Long-term heart problems. °· Long-term lung problems. °· Trouble controlling body systems, like breathing. °· Bleeding in the brain. °· A condition called cerebral palsy. °· Learning difficulties. °· Death. °These risks are highest for babies who are born before 34 weeks of pregnancy. °How is preterm labor treated? °Treatment depends on: °· How long you were pregnant. °· Your condition. °· The health of your baby. °Treatment may involve: °· Having a stitch (suture) placed in your cervix. When you give birth, your cervix opens so the baby can come out. The stitch keeps the cervix  from opening too soon. °· Staying at the hospital. °· Taking or getting medicines, such as: °? Hormone medicines. °? Medicines to stop contractions. °? Medicines to help the baby’s lungs develop. °? Medicines to prevent your baby from having cerebral palsy. °What should I do if I am in preterm labor? °If you think you are going into labor too soon, call your doctor right away. °How can I prevent preterm labor? °· Do not use any tobacco products. °? Examples of these are cigarettes, chewing tobacco, and e-cigarettes. °? If you need help quitting, ask your doctor. °· Do not use street drugs. °· Do not use any medicines unless you ask your doctor if they are safe for you. °· Talk with your doctor before taking any herbal supplements. °· Make sure you gain enough weight. °· Watch for infection. If you think you might have an infection, get it checked right away. °· If you have gone into preterm labor before, tell your doctor. °This information is not intended to replace advice given to you by your health care provider. Make sure you discuss any questions you have with your health care provider. °Document Released: 03/05/2009 Document Revised: 05/19/2016 Document Reviewed: 04/29/2016 °Elsevier Interactive Patient Education © 2019 [employer] Inc[employer] ° °

## 2019-03-22 NOTE — MAU Provider Note (Signed)
Chief Complaint:  Contractions   First Provider Initiated Contact with Patient 03/22/19 [phone]      HPI: Sylvia Thompson is a 33 y.o. [id number] at 76w1dwho presents to maternity admissions reporting uterine contractions since yesterday.  Got worse this morning.  . She reports good fetal movement, denies LOF, vaginal bleeding, vaginal itching/burning, urinary symptoms, h/a, dizziness, n/v, diarrhea, constipation or fever/chills.  She denies headache, visual changes or RUQ abdominal pain.  Was seen for spotting on 03/17/2019 and cervix was long and closed  RN Note: PT SAYS YESTERDAY - STARTED UC'S.  THEN AT 0100- DRANK WATER- WORSE LAYING.  AT 4 PM-  STILL STRONG .  PNC  WITH FAMINA. LAST SEX- 1 WEEK  AGO.  DENIES HSV AND MRSA  Past Medical History: Past Medical History:  Diagnosis Date  . Blindness, legal   . Cataract   . Gestational diabetes    2007  . Glaucoma   . Prosthetic eye globe     Past obstetric history: OB History  Gravida Para Term Preterm AB Living  6 3 2 1 2 3   SAB TAB Ectopic Multiple Live Births  2       3    # Outcome Date GA Lbr Len/2nd Weight Sex Delivery Anes PTL Lv  6 Current           5 Term 07/07/14 [redacted]w[redacted]d   M Vag-Spont EPI N LIV  4 Term 12/11/12    M Vag-Spont EPI N LIV  3 Preterm 06/12/06 [redacted]w[redacted]d   F Vag-Spont EPI Y LIV  2 SAB           1 SAB             Past Surgical History: Past Surgical History:  Procedure Laterality Date  . DILATION AND CURETTAGE OF UTERUS    . EYE SURGERY     prosthetic right eye    Family History: Family History  Problem Relation Age of Onset  . Diabetes Mother   . Arthritis Sister     Social History: Social History   Tobacco Use  . Smoking status: Never Smoker  . Smokeless tobacco: Never Used  Substance Use Topics  . Alcohol use: Not Currently  . Drug use: No    Allergies:  Allergies  Allergen Reactions  . Timolol Shortness Of Breath  . Latex Rash    Meds:  Medications Prior to Admission  Medication  Sig Dispense Refill Last Dose  . albuterol (PROAIR HFA) 108 (90 Base) MCG/ACT inhaler Inhale 2 puffs into the lungs every 6 (six) hours as needed for wheezing or shortness of breath. 8 g 3 Past Week at Unknown time  . brimonidine (ALPHAGAN P) 0.1 % SOLN Place 1 drop into the left eye 2 (two) times daily.   03/21/2019 at Unknown time  . Brinzolamide-Brimonidine (SIMBRINZA OP) Place 1 drop into the left eye at bedtime.   03/21/2019 at Unknown time  . dorzolamide (TRUSOPT) 2 % ophthalmic solution Place 1 drop into both eyes 2 (two) times daily.   03/21/2019 at Unknown time  . ferrous sulfate 325 (65 FE) MG tablet Take 1 tablet (325 mg total) by mouth 2 (two) times daily with a meal. 60 tablet 5 03/21/2019 at Unknown time  . Prenatal Multivit-Min-Fe-FA (PRENATAL VITAMINS PO) Take by mouth.   03/21/2019 at Unknown time    I have reviewed patient's Past Medical Hx, Surgical Hx, Family Hx, Social Hx, medications and allergies.   ROS:  Review of Systems  Constitutional: Negative for chills and fever.  Respiratory: Negative for shortness of breath.   Gastrointestinal: Positive for abdominal pain. Negative for constipation, diarrhea and nausea.  Genitourinary: Positive for pelvic pain. Negative for dysuria.  Neurological: Negative for dizziness.   Other systems negative  Physical Exam   Patient Vitals for the past 24 hrs:  BP Temp Temp src Pulse Resp Height Weight  03/22/19 0615 120/68 - - 87 - - -  03/22/19 0550 127/62 98.5 F (36.9 C) Oral 81 18 5' (1.524 m) 79.6 kg   Constitutional: Well-developed, well-nourished female in no acute distress.  Cardiovascular: normal rate and rhythm Respiratory: normal effort, clear to auscultation bilaterally GI: Abd soft, non-tender, gravid appropriate for gestational age.   No rebound or guarding. MS: Extremities nontender, no edema, normal ROM Neurologic: Alert and oriented x 4.  GU: Neg CVAT.  PELVIC EXAM:  Dilation: Closed Effacement (%): 30 Station:  Ballotable, -3 Exam by:: MARIE, CNM  FHT:  Baseline 135 , moderate variability, accelerations present, no decelerations Contractions: q 2-4 mins Irregular     Labs: O/Positive/-- (10/30 1653) Results for orders placed or performed during the hospital encounter of 03/22/19 (from the past 24 hour(s))  Urinalysis, Routine w reflex microscopic     Status: Abnormal   Collection Time: 03/22/19  5:46 AM  Result Value Ref Range   Color, Urine YELLOW YELLOW   APPearance CLOUDY (A) CLEAR   Specific Gravity, Urine 1.004 (L) 1.005 - 1.030   pH 7.0 5.0 - 8.0   Glucose, UA NEGATIVE NEGATIVE mg/dL   Hgb urine dipstick MODERATE (A) NEGATIVE   Bilirubin Urine NEGATIVE NEGATIVE   Ketones, ur NEGATIVE NEGATIVE mg/dL   Protein, ur NEGATIVE NEGATIVE mg/dL   Nitrite NEGATIVE NEGATIVE   Leukocytes,Ua TRACE (A) NEGATIVE   RBC / HPF 0-5 0 - 5 RBC/hpf   WBC, UA 6-10 0 - 5 WBC/hpf   Bacteria, UA MANY (A) NONE SEEN   Squamous Epithelial / LPF 21-50 0 - 5  Fetal fibronectin     Status: None   Collection Time: 03/22/19  6:35 AM  Result Value Ref Range   Fetal Fibronectin NEGATIVE NEGATIVE    Imaging:    MAU Course/MDM: I have ordered fetal fibronectin which was negative.  NST reviewed, reactive.   Treatments in MAU included     Procardia series x 2 doses.  Now contractions have spaced out to 8-12 minutes.  Since FFn is negative, will not dose any further.   Assessment: Single intrauterine pregnancy at [redacted]w[redacted]d Preterm uterine contractions Negative fetal fibronectin.  Plan: Discharge home Preterm Labor precautions and fetal kick counts Follow up in Office for prenatal visits and recheck of status  Encouraged to return here or to other Urgent Care/ED if she develops worsening of symptoms, increase in pain, fever, or other concerning symptoms.   Pt stable at time of discharge.  Wynelle Bourgeois CNM, MSN Certified Nurse-Midwife 03/22/2019 7:10 AM

## 2019-03-27 ENCOUNTER — Other Ambulatory Visit: Payer: Self-pay

## 2019-03-27 ENCOUNTER — Ambulatory Visit (INDEPENDENT_AMBULATORY_CARE_PROVIDER_SITE_OTHER): Payer: Medicare Other | Admitting: Advanced Practice Midwife

## 2019-03-27 DIAGNOSIS — O4703 False labor before 37 completed weeks of gestation, third trimester: Secondary | ICD-10-CM

## 2019-03-27 DIAGNOSIS — Z3A33 33 weeks gestation of pregnancy: Secondary | ICD-10-CM | POA: Diagnosis not present

## 2019-03-27 DIAGNOSIS — Z3480 Encounter for supervision of other normal pregnancy, unspecified trimester: Secondary | ICD-10-CM

## 2019-03-27 NOTE — Progress Notes (Signed)
Pt  has B/P cuff at home  MAU on 03/17/19-03/22/19

## 2019-03-27 NOTE — Progress Notes (Addendum)
   TELEHEALTH VIRTUAL OBSTETRICS VISIT ENCOUNTER NOTE  I connected with Sylvia Thompson on 03/27/19 at 10:55 AM EDT by telephone at home and verified that I am speaking with the correct person using two identifiers.   I discussed the limitations, risks, security and privacy concerns of performing an evaluation and management service by telephone and the availability of in person appointments. I also discussed with the patient that there may be a patient responsible charge related to this service. The patient expressed understanding and agreed to proceed.  Subjective:  Sylvia Thompson is a 33 y.o. [phone] at [redacted]w[redacted]d being followed for ongoing prenatal care.  She is currently monitored for the following issues for this low-risk pregnancy and has Blindness, legal; HPV in female; Supervision of other normal pregnancy, antepartum; Nausea and vomiting during pregnancy prior to [redacted] weeks gestation; Trichomonal vaginitis in pregnancy, first trimester; History of preterm delivery- IOL for oligo ; History of gestational diabetes; Marginal insertion of umbilical cord affecting management of mother; and Chlamydia on their problem list.  Patient reports occasional contractions. Reports fetal movement. Denies any contractions, bleeding or leaking of fluid.   The following portions of the patient's history were reviewed and updated as appropriate: allergies, current medications, past family history, past medical history, past social history, past surgical history and problem list.   Objective:   General:  Alert, oriented and cooperative.   Mental Status: Normal mood and affect perceived. Normal judgment and thought content.  Rest of physical exam deferred due to type of encounter  Assessment and Plan:  Pregnancy: [id number] at [redacted]w[redacted]d 1. Supervision of other normal pregnancy, antepartum --Anticipatory guidance about next visits/weeks of pregnancy given. --Pt reports good fetal movement --Reviewed safety, visitor  policy, reassurance about COVID-19 for pregnancy at this time. Discussed possible changes to visits, including televisits, that may occur due to COVID-19.  The office remains open if pt needs to be seen and MAU is open 24 hours/day for OB emergencies. --Enroll in [employer] today. Pt has BP cuff and scale. --Next visit in person for GBS/GCC    2. Preterm uterine contractions in third trimester, antepartum --Pt has episodes of contractions 4-5 times per hour that resolve with position change/shower/rest.   --FFN negative on 03/22/19 in MAU --Reviewed signs of labor, reasons to seek care with pt  Preterm labor symptoms and general obstetric precautions including but not limited to vaginal bleeding, contractions, leaking of fluid and fetal movement were reviewed in detail with the patient.  I discussed the assessment and treatment plan with the patient. The patient was provided an opportunity to ask questions and all were answered. The patient agreed with the plan and demonstrated an understanding of the instructions. The patient was advised to call back or seek an in-person office evaluation/go to MAU at [facility] for any urgent or concerning symptoms. Please refer to After Visit Summary for other counseling recommendations.   I provided 15 minutes of non-face-to-face time during this encounter.  No follow-ups on file.  Future Appointments  Date Time Provider Department Center  04/19/2019 10:15 AM [facility] [place] 4 [facility]    Sharen Counter, CNM Center for [employer], [facility]

## 2019-03-31 ENCOUNTER — Inpatient Hospital Stay (HOSPITAL_COMMUNITY)
Admission: AD | Admit: 2019-03-31 | Discharge: 2019-03-31 | Disposition: A | Discharge status: Home / Self Care | Payer: Medicare Other | Attending: Obstetrics and Gynecology | Admitting: Obstetrics and Gynecology

## 2019-03-31 ENCOUNTER — Other Ambulatory Visit: Payer: Self-pay

## 2019-03-31 ENCOUNTER — Encounter (HOSPITAL_COMMUNITY): Payer: Self-pay | Admitting: *Deleted

## 2019-03-31 DIAGNOSIS — O4703 False labor before 37 completed weeks of gestation, third trimester: Secondary | ICD-10-CM | POA: Diagnosis not present

## 2019-03-31 DIAGNOSIS — H409 Unspecified glaucoma: Secondary | ICD-10-CM | POA: Insufficient documentation

## 2019-03-31 DIAGNOSIS — O9989 Other specified diseases and conditions complicating pregnancy, childbirth and the puerperium: Secondary | ICD-10-CM | POA: Insufficient documentation

## 2019-03-31 DIAGNOSIS — O212 Late vomiting of pregnancy: Secondary | ICD-10-CM | POA: Insufficient documentation

## 2019-03-31 DIAGNOSIS — M549 Dorsalgia, unspecified: Secondary | ICD-10-CM | POA: Diagnosis not present

## 2019-03-31 DIAGNOSIS — H548 Legal blindness, as defined in USA: Secondary | ICD-10-CM | POA: Insufficient documentation

## 2019-03-31 DIAGNOSIS — Z888 Allergy status to other drugs, medicaments and biological substances status: Secondary | ICD-10-CM | POA: Insufficient documentation

## 2019-03-31 DIAGNOSIS — Z3A34 34 weeks gestation of pregnancy: Secondary | ICD-10-CM | POA: Insufficient documentation

## 2019-03-31 DIAGNOSIS — Z833 Family history of diabetes mellitus: Secondary | ICD-10-CM | POA: Insufficient documentation

## 2019-03-31 DIAGNOSIS — Z8632 Personal history of gestational diabetes: Secondary | ICD-10-CM | POA: Insufficient documentation

## 2019-03-31 DIAGNOSIS — Z91040 Latex allergy status: Secondary | ICD-10-CM | POA: Diagnosis not present

## 2019-03-31 LAB — URINALYSIS, ROUTINE W REFLEX MICROSCOPIC
Bacteria, UA: NONE SEEN
Bilirubin Urine: NEGATIVE
Glucose, UA: NEGATIVE mg/dL
Ketones, ur: NEGATIVE mg/dL
Leukocytes,Ua: NEGATIVE
Nitrite: NEGATIVE
Protein, ur: NEGATIVE mg/dL
Specific Gravity, Urine: 1.016 (ref 1.005–1.030)
pH: 6 (ref 5.0–8.0)

## 2019-03-31 MED ORDER — NIFEDIPINE 10 MG PO CAPS
10.0000 mg | ORAL_CAPSULE | Freq: Once | ORAL | Status: AC
  Administered 2019-03-31: 10 mg via ORAL
  Filled 2019-03-31: qty 1

## 2019-03-31 MED ORDER — SODIUM CHLORIDE 0.9 % IV SOLN
8.0000 mg | Freq: Once | INTRAVENOUS | Status: AC
  Administered 2019-03-31: 8 mg via INTRAVENOUS
  Filled 2019-03-31: qty 4

## 2019-03-31 MED ORDER — LACTATED RINGERS IV BOLUS: 1000.0000 mL | Freq: Once | INTRAVENOUS | Status: DC

## 2019-03-31 MED ORDER — HYDROMORPHONE HCL 1 MG/ML IJ SOLN
1.0000 mg | Freq: Once | INTRAMUSCULAR | Status: AC
  Administered 2019-03-31: 1 mg via INTRAVENOUS
  Filled 2019-03-31: qty 1

## 2019-03-31 MED ORDER — LACTATED RINGERS IV BOLUS
1000.0000 mL | Freq: Once | INTRAVENOUS | Status: AC
  Administered 2019-03-31: 18:00:00 1000 mL via INTRAVENOUS

## 2019-03-31 NOTE — MAU Note (Addendum)
Still contracting, back and lower abd, been throwing up. Hx of PTL, is up here every weekend for it.  Is aware, that sex can make her contract.  Not on meds for PTL

## 2019-03-31 NOTE — MAU Provider Note (Signed)
History     CSN: [id number]  Arrival date and time: 03/31/19 1704   First Provider Initiated Contact with Patient 03/31/19 1756      Chief Complaint  Patient presents with  . Emesis  . Contractions   HPI   Ms.Sanjuana Kavaiesha H Burton is a 33 y.o. female [phone] @ 5138w3d here with vomiting and contractions. Says the contractions have been present all day and getting worse. No bleeding, no leaking of fluid. Has had several episodes of vomiting, no diarrhea. At times she feels the contractions in her lower abdomen and other times in her lower back. The pain in her back is lower and bilateral. Patient was seen here on 4/1 with the same complaints and this pain feels the same.   OB History    Gravida  6   Para  3   Term  2   Preterm  1   AB  2   Living  3     SAB  2   TAB      Ectopic      Multiple      Live Births  3           Past Medical History:  Diagnosis Date  . Blindness, legal   . Cataract   . Gestational diabetes    2007  . Glaucoma   . Prosthetic eye globe     Past Surgical History:  Procedure Laterality Date  . DILATION AND CURETTAGE OF UTERUS    . EYE SURGERY     prosthetic right eye    Family History  Problem Relation Age of Onset  . Diabetes Mother   . Arthritis Sister     Social History   Tobacco Use  . Smoking status: Never Smoker  . Smokeless tobacco: Never Used  Substance Use Topics  . Alcohol use: Not Currently  . Drug use: No    Allergies:  Allergies  Allergen Reactions  . Timolol Shortness Of Breath  . Latex Rash    Medications Prior to Admission  Medication Sig Dispense Refill Last Dose  . albuterol (PROAIR HFA) 108 (90 Base) MCG/ACT inhaler Inhale 2 puffs into the lungs every 6 (six) hours as needed for wheezing or shortness of breath. 8 g 3 03/31/2019 at Unknown time  . brimonidine (ALPHAGAN P) 0.1 % SOLN Place 1 drop into the left eye 2 (two) times daily.   03/31/2019 at Unknown time  . Brinzolamide-Brimonidine  (SIMBRINZA OP) Place 1 drop into the left eye at bedtime.   03/31/2019 at Unknown time  . dorzolamide (TRUSOPT) 2 % ophthalmic solution Place 1 drop into both eyes 2 (two) times daily.   03/31/2019 at Unknown time  . ferrous sulfate 325 (65 FE) MG tablet Take 1 tablet (325 mg total) by mouth 2 (two) times daily with a meal. 60 tablet 5 03/31/2019 at Unknown time  . Prenatal Multivit-Min-Fe-FA (PRENATAL VITAMINS PO) Take by mouth.   03/31/2019 at Unknown time   Results for orders placed or performed during the hospital encounter of 03/31/19 (from the past 48 hour(s))  Urinalysis, Routine w reflex microscopic     Status: Abnormal   Collection Time: 03/31/19  6:03 PM  Result Value Ref Range   Color, Urine YELLOW YELLOW   APPearance CLEAR CLEAR   Specific Gravity, Urine 1.016 1.005 - 1.030   pH 6.0 5.0 - 8.0   Glucose, UA NEGATIVE NEGATIVE mg/dL   Hgb urine dipstick LARGE (A) NEGATIVE   Bilirubin Urine  NEGATIVE NEGATIVE   Ketones, ur NEGATIVE NEGATIVE mg/dL   Protein, ur NEGATIVE NEGATIVE mg/dL   Nitrite NEGATIVE NEGATIVE   Leukocytes,Ua NEGATIVE NEGATIVE   RBC / HPF 21-50 0 - 5 RBC/hpf   WBC, UA 0-5 0 - 5 WBC/hpf   Bacteria, UA NONE SEEN NONE SEEN   Squamous Epithelial / LPF 0-5 0 - 5   Mucus PRESENT     Comment: Performed at [facility], [address], [place], [place] [address]   Review of Systems  Gastrointestinal: Positive for abdominal pain (Contractions ), nausea and vomiting.  Genitourinary: Negative for vaginal bleeding and vaginal discharge.   Physical Exam   Blood pressure (!) 120/56, pulse 71, temperature 98.8 F (37.1 C), temperature source Oral, resp. rate 16, last menstrual period 08/02/2018, SpO2 100 %.  Physical Exam  Constitutional: She is oriented to person, place, and time. She appears well-developed and well-nourished. No distress.  HENT:  Head: Normocephalic.  Eyes: Pupils are equal, round, and reactive to light.  Neck: Neck supple.  Respiratory: Effort  normal.  GI: Soft. She exhibits no distension. There is no abdominal tenderness.  Genitourinary:    Genitourinary Comments: Dilation: 1 Effacement (%): 50, Thick  Station: Ballotable Exam by:: J Shanele Nissan NP   Musculoskeletal: Normal range of motion.  Neurological: She is alert and oriented to person, place, and time.  Skin: Skin is warm. She is not diaphoretic.  Psychiatric: Her behavior is normal.   Fetal Tracing: Baseline: 130 bpm Variability: Moderate  Accelerations: 15x15 Decelerations: None Toco: Irregular pattern   MAU Course  Procedures  None  MDM  Negative FFN on 4/1 LR bolus X 1 liter Zofran 8 mg IV & Dilaudid 1 mg IV Procardia X 1 dose Hematuria in urine, urine culture sent  Patient currently rates her pain 0/10. Says she feels much better.  She was offered an Rx for flexeril for musculoskeletal pain however she declined. Back pain could potentially represent a stone however back pain is bilateral.   Assessment and Plan   A:  1. Threatened premature labor in third trimester   2. Back pain in pregnancy   3. [redacted] weeks gestation of pregnancy     P:  Discharge home with strict return precautions Return to MAU if symptoms worsen Follow up with OB as scheduled Increase oral fluid hydration & pelvic rest  Domonique Cothran, Harolyn Rutherford, NP 03/31/2019 7:38 PM

## 2019-03-31 NOTE — Discharge Instructions (Signed)
Preventing Preterm Birth  Preterm birth is when your baby is delivered between 20 weeks and 37 weeks of pregnancy. A full-term pregnancy lasts for at least 37 weeks. Preterm birth can be dangerous for your baby because the last few weeks of pregnancy are an important time for your baby's brain and lungs to grow. Many things can cause a baby to be born early. Sometimes the cause is not known. There are certain factors that make you more likely to experience preterm birth, such as:  · Having a previous baby born preterm.  · Being pregnant with twins or other multiples.  · Having had fertility treatment.  · Being overweight or underweight at the start of your pregnancy.  · Having any of the following during pregnancy:  ? An infection, including a urinary tract infection (UTI) or an STI (sexually transmitted infection).  ? High blood pressure.  ? Diabetes.  ? Vaginal bleeding.  · Being age 35 or older.  · Being age 18 or younger.  · Getting pregnant within 6 months of a previous pregnancy.  · Suffering extreme stress or physical or emotional abuse during pregnancy.  · Standing for long periods of time during pregnancy, such as working at a job that requires standing.  What are the risks?  The most serious risk of preterm birth is that the baby may not survive. This is more likely to happen if a baby is born before 34 weeks. Other risks and complications of preterm birth may include your baby having:  · Breathing problems.  · Brain damage that affects movement and coordination (cerebral palsy).  · Feeding difficulties.  · Vision or hearing problems.  · Infections or inflammation of the digestive tract (colitis).  · Developmental delays.  · Learning disabilities.  · Higher risk for diabetes, heart disease, and high blood pressure later in life.  What can I do to lower my risk?    Medical care  The most important thing you can do to lower your risk for preterm birth is to get routine medical care during pregnancy (prenatal  care). If you have a high risk of preterm birth, you may be referred to a health care provider who specializes in managing high-risk pregnancies (perinatologist). You may be given medicine to help prevent preterm birth.  Lifestyle changes  Certain lifestyle changes can also lower your risk of preterm birth:  · Wait at least 6 months after a pregnancy to become pregnant again.  · Try to plan pregnancy for when you are between 19 and 35 years old.  · Get to a healthy weight before getting pregnant. If you are overweight, work with your health care provider to safely lose weight.  · Do not use any products that contain nicotine or tobacco, such as cigarettes and e-cigarettes. If you need help quitting, ask your health care provider.  · Do not drink alcohol.  · Do not use drugs.  Where to find support  For more support, consider:  · Talking with your health care provider.  · Talking with a therapist or substance abuse counselor, if you need help quitting.  · Working with a diet and nutrition specialist (dietitian) or a personal trainer to maintain a healthy weight.  · Joining a support group.  Where to find more information  Learn more about preventing preterm birth from:  · Centers for Disease Control and Prevention: cdc.gov/reproductivehealth/maternalinfanthealth/pretermbirth.htm  · March of Dimes: marchofdimes.org/complications/premature-babies.aspx  · American Pregnancy Association: americanpregnancy.org/labor-and-birth/premature-labor  Contact a health care   provider if:  · You have any of the following signs of preterm labor before 37 weeks:  ? A change or increase in vaginal discharge.  ? Fluid leaking from your vagina.  ? Pressure or cramps in your lower abdomen.  ? A backache that does not go away or gets worse.  ? Regular tightening (contractions) in your lower abdomen.  Summary  · Preterm birth means having your baby during weeks 20-37 of pregnancy.  · Preterm birth may put your baby at risk for physical and  mental problems.  · Getting good prenatal care can help prevent preterm birth.  · You can lower your risk of preterm birth by making certain lifestyle changes, such as not smoking and not using alcohol.  This information is not intended to replace advice given to you by your health care provider. Make sure you discuss any questions you have with your health care provider.  Document Released: 01/21/2016 Document Revised: 08/15/2016 Document Reviewed: 08/15/2016  Elsevier Interactive Patient Education © 2019 Elsevier Inc.

## 2019-04-02 LAB — CULTURE, OB URINE
Culture: NO GROWTH
Special Requests: NORMAL

## 2019-04-10 ENCOUNTER — Ambulatory Visit (INDEPENDENT_AMBULATORY_CARE_PROVIDER_SITE_OTHER): Payer: Medicare Other | Admitting: Advanced Practice Midwife

## 2019-04-10 ENCOUNTER — Other Ambulatory Visit (HOSPITAL_COMMUNITY)
Admission: RE | Admit: 2019-04-10 | Discharge: 2019-04-10 | Disposition: A | Discharge status: Home / Self Care | Payer: Medicare Other | Source: Ambulatory Visit | Attending: Advanced Practice Midwife | Admitting: Advanced Practice Midwife

## 2019-04-10 ENCOUNTER — Other Ambulatory Visit: Payer: Self-pay

## 2019-04-10 DIAGNOSIS — Z3480 Encounter for supervision of other normal pregnancy, unspecified trimester: Secondary | ICD-10-CM

## 2019-04-10 DIAGNOSIS — Z3483 Encounter for supervision of other normal pregnancy, third trimester: Secondary | ICD-10-CM

## 2019-04-10 DIAGNOSIS — Z3A35 35 weeks gestation of pregnancy: Secondary | ICD-10-CM

## 2019-04-10 NOTE — Patient Instructions (Signed)
Labor Precautions Reasons to come to MAU at [facility]:  1.  Contractions are  5 minutes apart or less, each last 1 minute, these have been going on for 1-2 hours, and you cannot walk or talk during them 2.  You have a large gush of fluid, or a trickle of fluid that will not stop and you have to wear a pad 3.  You have bleeding that is bright red, heavier than spotting--like menstrual bleeding (spotting can be normal in early labor or after a check of your cervix) 4.  You do not feel the baby moving like he/she normally does  

## 2019-04-10 NOTE — Progress Notes (Signed)
   PRENATAL VISIT NOTE  Subjective:  Sylvia Thompson is a 33 y.o. [phone] at [redacted]w[redacted]d being seen today for ongoing prenatal care.  She is currently monitored for the following issues for this low-risk pregnancy and has Blindness, legal; HPV in female; Supervision of other normal pregnancy, antepartum; Nausea and vomiting during pregnancy prior to [redacted] weeks gestation; Trichomonal vaginitis in pregnancy, first trimester; History of preterm delivery- IOL for oligo ; History of gestational diabetes; Marginal insertion of umbilical cord affecting management of mother; and Chlamydia on their problem list.  Patient reports occasional contractions and pelvic pressure, vision changes with some blurred vision.  Contractions: Irritability. Vag. Bleeding: None.  Movement: Present. Denies leaking of fluid.   The following portions of the patient's history were reviewed and updated as appropriate: allergies, current medications, past family history, past medical history, past social history, past surgical history and problem list.   Objective:   Vitals:   04/10/19 1026  BP: 113/74  Pulse: 86  Weight: 80.4 kg    Fetal Status: Fetal Heart Rate (bpm): 146 Fundal Height: 36 cm Movement: Present  Presentation: Vertex  General:  Alert, oriented and cooperative. Patient is in no acute distress.  Skin: Skin is warm and dry. No rash noted.   Cardiovascular: Normal heart rate noted  Respiratory: Normal respiratory effort, no problems with respiration noted  Abdomen: Soft, gravid, appropriate for gestational age.  Pain/Pressure: Present     Pelvic: Cervical exam performed Dilation: 1 Effacement (%): 0 Station: -3  Extremities: Normal range of motion.  Edema: Trace  Mental Status: Normal mood and affect. Normal behavior. Normal judgment and thought content.   Assessment and Plan:  Pregnancy: [id number] at [redacted]w[redacted]d 1. Supervision of other normal pregnancy, antepartum --Anticipatory guidance about next visits/weeks of  pregnancy given. --Reviewed safety, visitor policy, reassurance about COVID-19 for pregnancy at this time. Discussed possible changes to visits, including televisits, that may occur due to COVID-19.  The office remains open if pt needs to be seen and MAU is open 24 hours/day for OB emergencies.   - Cervicovaginal ancillary only( [place]) - Strep Gp B NAA  Preterm labor symptoms and general obstetric precautions including but not limited to vaginal bleeding, contractions, leaking of fluid and fetal movement were reviewed in detail with the patient. Please refer to After Visit Summary for other counseling recommendations.   Return in about 1 week (around 04/17/2019).  Future Appointments  Date Time Provider Department Center  04/19/2019 10:15 AM [facility] [place] 4 WH-MFCUS [facility]-US    Sharen Counter, CNM

## 2019-04-10 NOTE — Progress Notes (Signed)
Patient reports fetal movement with pressure. 

## 2019-04-11 LAB — CERVICOVAGINAL ANCILLARY ONLY
Chlamydia: NEGATIVE
Neisseria Gonorrhea: NEGATIVE

## 2019-04-12 ENCOUNTER — Encounter: Payer: Self-pay | Admitting: Advanced Practice Midwife

## 2019-04-12 DIAGNOSIS — O99820 Streptococcus B carrier state complicating pregnancy: Secondary | ICD-10-CM | POA: Insufficient documentation

## 2019-04-12 HISTORY — DX: Streptococcus B carrier state complicating pregnancy: O99.820

## 2019-04-12 LAB — STREP GP B NAA: Strep Gp B NAA: POSITIVE — AB

## 2019-04-17 ENCOUNTER — Ambulatory Visit (INDEPENDENT_AMBULATORY_CARE_PROVIDER_SITE_OTHER): Payer: Medicare Other | Admitting: Advanced Practice Midwife

## 2019-04-17 ENCOUNTER — Other Ambulatory Visit: Payer: Self-pay

## 2019-04-17 VITALS — BP 122/72

## 2019-04-17 DIAGNOSIS — O99820 Streptococcus B carrier state complicating pregnancy: Secondary | ICD-10-CM

## 2019-04-17 DIAGNOSIS — O479 False labor, unspecified: Secondary | ICD-10-CM

## 2019-04-17 DIAGNOSIS — Z3A36 36 weeks gestation of pregnancy: Secondary | ICD-10-CM

## 2019-04-17 DIAGNOSIS — Z3480 Encounter for supervision of other normal pregnancy, unspecified trimester: Secondary | ICD-10-CM

## 2019-04-17 NOTE — Progress Notes (Signed)
Pt is on the phone preparing for Webex visit with provider. [redacted]w[redacted]d.

## 2019-04-17 NOTE — Progress Notes (Signed)
   TELEHEALTH VIRTUAL OBSTETRICS VISIT ENCOUNTER NOTE  I connected with Sylvia Thompson on 04/17/19 at 11:15 AM EDT by telephone at home and verified that I am speaking with the correct person using two identifiers.   I discussed the limitations, risks, security and privacy concerns of performing an evaluation and management service by telephone and the availability of in person appointments. I also discussed with the patient that there may be a patient responsible charge related to this service. The patient expressed understanding and agreed to proceed.  Subjective:  Sylvia Thompson is a 33 y.o. [phone] at [redacted]w[redacted]d being followed for ongoing prenatal care.  She is currently monitored for the following issues for this low-risk pregnancy and has Blindness, legal; HPV in female; Supervision of other normal pregnancy, antepartum; Nausea and vomiting during pregnancy prior to [redacted] weeks gestation; Trichomonal vaginitis in pregnancy, first trimester; History of preterm delivery- IOL for oligo ; History of gestational diabetes; Marginal insertion of umbilical cord affecting management of mother; Chlamydia; and GBS (group B Streptococcus carrier), +RV culture, currently pregnant on their problem list.  Patient reports no complaints. Reports fetal movement. Denies any contractions, bleeding or leaking of fluid.   The following portions of the patient's history were reviewed and updated as appropriate: allergies, current medications, past family history, past medical history, past social history, past surgical history and problem list.   Objective:   General:  Alert, oriented and cooperative.   Mental Status: Normal mood and affect perceived. Normal judgment and thought content.  Rest of physical exam deferred due to type of encounter  Assessment and Plan:  Pregnancy: [id number] at [redacted]w[redacted]d 1. Supervision of other normal pregnancy, antepartum --Pt reports good fetal movement, denies cramping, LOF, or frequent  cramping --Anticipatory guidance about next visits/weeks of pregnancy given. --Reviewed safety, visitor policy, reassurance about COVID-19 for pregnancy at this time. Discussed possible changes to visits, including televisits, that may occur due to COVID-19.  The office remains open if pt needs to be seen and MAU is open 24 hours/day for OB emergencies.    2. GBS (group B Streptococcus carrier), +RV culture, currently pregnant --Reviewed lab results with pt and need for PCN during labor  3. Braxton Hicks contractions --Pt reports episodes of back cramping that is regular, then goes away with walking or drinking water. --Reviewed signs of labor/reasons to come to MAU  Term labor symptoms and general obstetric precautions including but not limited to vaginal bleeding, contractions, leaking of fluid and fetal movement were reviewed in detail with the patient.  I discussed the assessment and treatment plan with the patient. The patient was provided an opportunity to ask questions and all were answered. The patient agreed with the plan and demonstrated an understanding of the instructions. The patient was advised to call back or seek an in-person office evaluation/go to MAU at [facility] for any urgent or concerning symptoms. Please refer to After Visit Summary for other counseling recommendations.   I provided 10 minutes of non-face-to-face time during this encounter.  No follow-ups on file.  Future Appointments  Date Time Provider Department Center  04/19/2019 10:15 AM WH-MFC [place] 4 WH-MFCUS MFC-US    Sharen Counter, CNM Center for [employer], [facility]

## 2019-04-19 ENCOUNTER — Ambulatory Visit (HOSPITAL_COMMUNITY)
Admission: RE | Admit: 2019-04-19 | Discharge: 2019-04-19 | Disposition: A | Discharge status: Home / Self Care | Payer: Medicare Other | Source: Ambulatory Visit | Attending: Obstetrics and Gynecology | Admitting: Obstetrics and Gynecology

## 2019-04-19 ENCOUNTER — Other Ambulatory Visit: Payer: Self-pay

## 2019-04-19 DIAGNOSIS — Z362 Encounter for other antenatal screening follow-up: Secondary | ICD-10-CM | POA: Diagnosis not present

## 2019-04-19 DIAGNOSIS — O43199 Other malformation of placenta, unspecified trimester: Secondary | ICD-10-CM | POA: Insufficient documentation

## 2019-04-22 ENCOUNTER — Encounter (HOSPITAL_COMMUNITY): Payer: Self-pay

## 2019-04-22 ENCOUNTER — Inpatient Hospital Stay (HOSPITAL_COMMUNITY)
Admission: AD | Admit: 2019-04-22 | Discharge: 2019-04-22 | Disposition: A | Discharge status: Home / Self Care | Payer: Medicare Other | Source: Ambulatory Visit | Attending: Obstetrics and Gynecology | Admitting: Obstetrics and Gynecology

## 2019-04-22 ENCOUNTER — Other Ambulatory Visit: Payer: Self-pay

## 2019-04-22 DIAGNOSIS — O23591 Infection of other part of genital tract in pregnancy, first trimester: Secondary | ICD-10-CM

## 2019-04-22 DIAGNOSIS — Z3A38 38 weeks gestation of pregnancy: Secondary | ICD-10-CM | POA: Diagnosis not present

## 2019-04-22 DIAGNOSIS — A5901 Trichomonal vulvovaginitis: Secondary | ICD-10-CM

## 2019-04-22 DIAGNOSIS — R52 Pain, unspecified: Secondary | ICD-10-CM | POA: Diagnosis not present

## 2019-04-22 DIAGNOSIS — O479 False labor, unspecified: Secondary | ICD-10-CM

## 2019-04-22 DIAGNOSIS — O43199 Other malformation of placenta, unspecified trimester: Secondary | ICD-10-CM

## 2019-04-22 DIAGNOSIS — A749 Chlamydial infection, unspecified: Secondary | ICD-10-CM

## 2019-04-22 DIAGNOSIS — Z8751 Personal history of pre-term labor: Secondary | ICD-10-CM

## 2019-04-22 DIAGNOSIS — Z3A37 37 weeks gestation of pregnancy: Secondary | ICD-10-CM | POA: Diagnosis not present

## 2019-04-22 DIAGNOSIS — O471 False labor at or after 37 completed weeks of gestation: Secondary | ICD-10-CM | POA: Diagnosis not present

## 2019-04-22 DIAGNOSIS — Z3480 Encounter for supervision of other normal pregnancy, unspecified trimester: Secondary | ICD-10-CM

## 2019-04-22 NOTE — MAU Provider Note (Signed)
Ms. Sylvia Thompson is a [id number] at [redacted]w[redacted]d seen in MAU for labor. RN labor check, not seen by provider. SVE by RN Dilation: 1 Effacement (%): 50 Station: -3 Exam by:: D Simpson RN   NST - FHR: 135 bpm / moderate variability / accels present / decels absent / TOCO: irregular every 4-6 minutes   Plan:  D/C home with labor precautions and fetal kick counts  Keep scheduled appt in office  Sharen Counter, CNM  04/22/2019 2:23 AM

## 2019-04-22 NOTE — MAU Note (Signed)
Pt arrived via EMS c/o of cntrx for two hours which have gotten worse. Denies LOF or bleeding. +FM. Has had PTL in this pregnancy.

## 2019-04-22 NOTE — Discharge Instructions (Signed)
Reasons to return to MAU at [facility]:  1.  Contractions are  5 minutes apart or less, each last 1 minute, these have been going on for 1-2 hours, and you cannot walk or talk during them 2.  You have a large gush of fluid, or a trickle of fluid that will not stop and you have to wear a pad 3.  You have bleeding that is bright red, heavier than spotting--like menstrual bleeding (spotting can be normal in early labor or after a check of your cervix) 4.  You do not feel the baby moving like he/she normally does  

## 2019-04-24 ENCOUNTER — Other Ambulatory Visit: Payer: Self-pay

## 2019-04-24 ENCOUNTER — Encounter: Payer: Self-pay | Admitting: Obstetrics

## 2019-04-24 ENCOUNTER — Ambulatory Visit (INDEPENDENT_AMBULATORY_CARE_PROVIDER_SITE_OTHER): Payer: Medicare Other | Admitting: Obstetrics

## 2019-04-24 DIAGNOSIS — O0990 Supervision of high risk pregnancy, unspecified, unspecified trimester: Secondary | ICD-10-CM

## 2019-04-24 DIAGNOSIS — O0993 Supervision of high risk pregnancy, unspecified, third trimester: Secondary | ICD-10-CM

## 2019-04-24 DIAGNOSIS — O09213 Supervision of pregnancy with history of pre-term labor, third trimester: Secondary | ICD-10-CM

## 2019-04-24 DIAGNOSIS — O43199 Other malformation of placenta, unspecified trimester: Secondary | ICD-10-CM

## 2019-04-24 DIAGNOSIS — Z8751 Personal history of pre-term labor: Secondary | ICD-10-CM

## 2019-04-24 DIAGNOSIS — Z3A37 37 weeks gestation of pregnancy: Secondary | ICD-10-CM

## 2019-04-24 DIAGNOSIS — O43193 Other malformation of placenta, third trimester: Secondary | ICD-10-CM

## 2019-04-24 NOTE — Progress Notes (Signed)
ROB TELEHEALTH  CC: None    B/P: 113/66 this morning.

## 2019-04-24 NOTE — Progress Notes (Signed)
   TELEHEALTH VIRTUAL OBSTETRICS PRENATAL VISIT ENCOUNTER NOTE  I connected with Sylvia Thompson on 04/24/19 at  9:30 AM EDT by WebEx at home and verified that I am speaking with the correct person using two identifiers.   I discussed the limitations, risks, security and privacy concerns of performing an evaluation and management service by telephone and the availability of in person appointments. I also discussed with the patient that there may be a patient responsible charge related to this service. The patient expressed understanding and agreed to proceed. Subjective:  Sylvia Thompson is a 33 y.o. [phone] at [redacted]w[redacted]d being seen today for ongoing prenatal care.  She is currently monitored for the following issues for this high-risk pregnancy and has Blindness, legal; HPV in female; Supervision of other normal pregnancy, antepartum; Nausea and vomiting during pregnancy prior to [redacted] weeks gestation; Trichomonal vaginitis in pregnancy, first trimester; History of preterm delivery- IOL for oligo ; History of gestational diabetes; Marginal insertion of umbilical cord affecting management of mother; Chlamydia; and GBS (group B Streptococcus carrier), +RV culture, currently pregnant on their problem list.  Patient reports backache.  Reports fetal movement. Contractions: Irritability. Vag. Bleeding: None.  Movement: Present. Denies any contractions, bleeding or leaking of fluid.   The following portions of the patient's history were reviewed and updated as appropriate: allergies, current medications, past family history, past medical history, past social history, past surgical history and problem list.   Objective:  There were no vitals filed for this visit.  Fetal Status:     Movement: Present     General:  Alert, oriented and cooperative. Patient is in no acute distress.  Respiratory: Normal respiratory effort, no problems with respiration noted  Mental Status: Normal mood and affect. Normal behavior.  Normal judgment and thought content.  Rest of physical exam deferred due to type of encounter  Assessment and Plan:  Pregnancy: [id number] at [redacted]w[redacted]d 1. Supervision of high risk pregnancy, antepartum  2. History of preterm delivery - stable clinically  3. Marginal insertion of umbilical cord affecting management of mother   Term labor symptoms and general obstetric precautions including but not limited to vaginal bleeding, contractions, leaking of fluid and fetal movement were reviewed in detail with the patient. I discussed the assessment and treatment plan with the patient. The patient was provided an opportunity to ask questions and all were answered. The patient agreed with the plan and demonstrated an understanding of the instructions. The patient was advised to call back or seek an in-person office evaluation/go to MAU at [facility] for any urgent or concerning symptoms. Please refer to After Visit Summary for other counseling recommendations.   I provided 10 minutes of face-to-face via WebEx time during this encounter.  Return in about 1 week (around 05/01/2019) for ROB.  Future Appointments  Date Time Provider Department Center  05/01/2019 10:30 AM Leftwich-Kirby, Wilmer Floor, CNM [facility] None  05/08/2019 10:15 AM Leftwich-Kirby, Wilmer Floor, CNM [facility] None  05/16/2019 10:45 AM Constant, Gigi Gin, MD [facility] None    Coral Ceo, MD Center for [facility], [facility] 04-24-2019

## 2019-05-01 ENCOUNTER — Ambulatory Visit (INDEPENDENT_AMBULATORY_CARE_PROVIDER_SITE_OTHER): Payer: Medicare Other | Admitting: Advanced Practice Midwife

## 2019-05-01 ENCOUNTER — Other Ambulatory Visit: Payer: Self-pay

## 2019-05-01 VITALS — BP 119/79 | HR 83 | Wt 178.7 lb

## 2019-05-01 DIAGNOSIS — O0990 Supervision of high risk pregnancy, unspecified, unspecified trimester: Secondary | ICD-10-CM

## 2019-05-01 DIAGNOSIS — Z8751 Personal history of pre-term labor: Secondary | ICD-10-CM

## 2019-05-01 DIAGNOSIS — Z3480 Encounter for supervision of other normal pregnancy, unspecified trimester: Secondary | ICD-10-CM

## 2019-05-01 DIAGNOSIS — Z3A38 38 weeks gestation of pregnancy: Secondary | ICD-10-CM

## 2019-05-01 DIAGNOSIS — O0993 Supervision of high risk pregnancy, unspecified, third trimester: Secondary | ICD-10-CM

## 2019-05-01 NOTE — Progress Notes (Signed)
   PRENATAL VISIT NOTE  Subjective:  Sylvia Thompson is a 33 y.o. [id number] at [redacted]w[redacted]d being seen today for ongoing prenatal care.  She is currently monitored for the following issues for this low-risk pregnancy and has Blindness, legal; HPV in female; Supervision of other normal pregnancy, antepartum; Nausea and vomiting during pregnancy prior to [redacted] weeks gestation; Trichomonal vaginitis in pregnancy, first trimester; History of preterm delivery- IOL for oligo ; History of gestational diabetes; Marginal insertion of umbilical cord affecting management of mother; Chlamydia; and GBS (group B Streptococcus carrier), +RV culture, currently pregnant on their problem list.  Patient reports occasional contractions.  Contractions: Irregular. Vag. Bleeding: None.  Movement: Present. Denies leaking of fluid.   The following portions of the patient's history were reviewed and updated as appropriate: allergies, current medications, past family history, past medical history, past social history, past surgical history and problem list.   Objective:   Vitals:   05/01/19 1022  BP: 119/79  Pulse: 83  Weight: 81.1 kg    Fetal Status: Fetal Heart Rate (bpm): 152 Fundal Height: 38 cm Movement: Present  Presentation: Vertex  General:  Alert, oriented and cooperative. Patient is in no acute distress.  Skin: Skin is warm and dry. No rash noted.   Cardiovascular: Normal heart rate noted  Respiratory: Normal respiratory effort, no problems with respiration noted  Abdomen: Soft, gravid, appropriate for gestational age.  Pain/Pressure: Present     Pelvic: Cervical exam performed Dilation: 2 Effacement (%): 50 Station: -2  Extremities: Normal range of motion.  Edema: Trace  Mental Status: Normal mood and affect. Normal behavior. Normal judgment and thought content.   Assessment and Plan:  Pregnancy: [id number] at [redacted]w[redacted]d 1. Supervision of other normal pregnancy, antepartum --Anticipatory guidance about next  visits/weeks of pregnancy given. --Cervix examined, pt favorable.  Desires to go into labor on her own. Discussed the [employer] to improve fetal position for labor. --Reviewed safety, visitor policy, reassurance about COVID-19 for pregnancy at this time. Discussed possible changes to visits, including televisits, that may occur due to COVID-19.  The office remains open if pt needs to be seen and MAU is open 24 hours/day for OB emergencies.    Term labor symptoms and general obstetric precautions including but not limited to vaginal bleeding, contractions, leaking of fluid and fetal movement were reviewed in detail with the patient. Please refer to After Visit Summary for other counseling recommendations.   Return in about 1 week (around 05/08/2019).  Future Appointments  Date Time Provider Department Center  05/08/2019 10:15 AM Leftwich-Kirby, Wilmer Floor, CNM [facility] None  05/16/2019  7:00 AM [facility] SCHED ROOM [facility] None  05/16/2019 10:45 AM Constant, Gigi Gin, MD [facility] None    Sharen Counter, CNM

## 2019-05-01 NOTE — Patient Instructions (Signed)
Labor Precautions Reasons to come to MAU at [facility]:  1.  Contractions are  5 minutes apart or less, each last 1 minute, these have been going on for 1-2 hours, and you cannot walk or talk during them 2.  You have a large gush of fluid, or a trickle of fluid that will not stop and you have to wear a pad 3.  You have bleeding that is bright red, heavier than spotting--like menstrual bleeding (spotting can be normal in early labor or after a check of your cervix) 4.  You do not feel the baby moving like he/she normally does  

## 2019-05-01 NOTE — Progress Notes (Signed)
Pt is here for ROB. [redacted]w[redacted]d.  

## 2019-05-02 ENCOUNTER — Telehealth (HOSPITAL_COMMUNITY): Payer: Self-pay | Admitting: *Deleted

## 2019-05-02 ENCOUNTER — Encounter (HOSPITAL_COMMUNITY): Payer: Self-pay | Admitting: *Deleted

## 2019-05-02 NOTE — Telephone Encounter (Signed)
Preadmission screen  

## 2019-05-03 ENCOUNTER — Other Ambulatory Visit: Payer: Self-pay

## 2019-05-03 ENCOUNTER — Inpatient Hospital Stay (EMERGENCY_DEPARTMENT_HOSPITAL)
Admission: AD | Admit: 2019-05-03 | Discharge: 2019-05-03 | Disposition: A | Discharge status: Home / Self Care | Payer: Medicare Other | Source: Home / Self Care | Attending: Obstetrics & Gynecology | Admitting: Obstetrics & Gynecology

## 2019-05-03 ENCOUNTER — Encounter (HOSPITAL_COMMUNITY): Payer: Self-pay

## 2019-05-03 DIAGNOSIS — O26893 Other specified pregnancy related conditions, third trimester: Secondary | ICD-10-CM

## 2019-05-03 DIAGNOSIS — Z3A39 39 weeks gestation of pregnancy: Secondary | ICD-10-CM | POA: Insufficient documentation

## 2019-05-03 DIAGNOSIS — Z0371 Encounter for suspected problem with amniotic cavity and membrane ruled out: Secondary | ICD-10-CM | POA: Insufficient documentation

## 2019-05-03 DIAGNOSIS — N898 Other specified noninflammatory disorders of vagina: Secondary | ICD-10-CM | POA: Diagnosis not present

## 2019-05-03 DIAGNOSIS — O471 False labor at or after 37 completed weeks of gestation: Secondary | ICD-10-CM | POA: Insufficient documentation

## 2019-05-03 HISTORY — DX: Anemia, unspecified: D64.9

## 2019-05-03 LAB — URINALYSIS, ROUTINE W REFLEX MICROSCOPIC
Bacteria, UA: NONE SEEN
Bilirubin Urine: NEGATIVE
Glucose, UA: NEGATIVE mg/dL
Ketones, ur: NEGATIVE mg/dL
Leukocytes,Ua: NEGATIVE
Nitrite: NEGATIVE
Protein, ur: NEGATIVE mg/dL
Specific Gravity, Urine: 1.009 (ref 1.005–1.030)
pH: 7 (ref 5.0–8.0)

## 2019-05-03 LAB — WET PREP, GENITAL
Clue Cells Wet Prep HPF POC: NONE SEEN
Sperm: NONE SEEN
Trich, Wet Prep: NONE SEEN
Yeast Wet Prep HPF POC: NONE SEEN

## 2019-05-03 LAB — POCT FERN TEST: POCT Fern Test: NEGATIVE

## 2019-05-03 NOTE — MAU Provider Note (Signed)
    S: Ms. Sylvia Thompson is a 33 y.o. [id number] at [redacted]w[redacted]d  who presents to MAU today complaining of leaking of fluid today. She denies vaginal bleeding. She endorses contractions- mild, irreg. She reports normal fetal movement.    O: BP 122/64 (BP Location: Right Arm)   Pulse 70   Temp 98.8 F (37.1 C) (Oral)   Resp 16   Ht 5' (1.524 m)   Wt 82.1 kg   LMP 08/02/2018 (Approximate)   BMI 35.35 kg/m  GENERAL: Well-developed, well-nourished female in no acute distress.  HEAD: Normocephalic, atraumatic.  CHEST: Normal effort of breathing, regular heart rate ABDOMEN: Soft, nontender, gravid PELVIC: Normal external female genitalia. Vagina is pink and rugated. Cervix with normal contour, no lesions. Copious thin white discharge.  No pooling. Neg ferning.  Cervical exam: 2/50/vtx -2, post   Wet prep: WBC mod, everything else neg  Fetal Monitoring:  Baseline: 140s Variability: avg LTV Accelerations: + 15x15 Decelerations: none Contractions: irreg, q 5 with interspersed irritability  Results for orders placed or performed during the hospital encounter of 05/03/19 (from the past 24 hour(s))  Urinalysis, Routine w reflex microscopic     Status: Abnormal   Collection Time: 05/03/19  9:04 PM  Result Value Ref Range   Color, Urine STRAW (A) YELLOW   APPearance CLEAR CLEAR   Specific Gravity, Urine 1.009 1.005 - 1.030   pH 7.0 5.0 - 8.0   Glucose, UA NEGATIVE NEGATIVE mg/dL   Hgb urine dipstick MODERATE (A) NEGATIVE   Bilirubin Urine NEGATIVE NEGATIVE   Ketones, ur NEGATIVE NEGATIVE mg/dL   Protein, ur NEGATIVE NEGATIVE mg/dL   Nitrite NEGATIVE NEGATIVE   Leukocytes,Ua NEGATIVE NEGATIVE   RBC / HPF 6-10 0 - 5 RBC/hpf   WBC, UA 0-5 0 - 5 WBC/hpf   Bacteria, UA NONE SEEN NONE SEEN   Squamous Epithelial / LPF 0-5 0 - 5   Mucus PRESENT      A: SIUP at [redacted]w[redacted]d  Membranes intact  P: D/C home with labor precautions  Arabella Merles, CNM 05/03/2019 9:49 PM

## 2019-05-03 NOTE — Discharge Instructions (Signed)
Braxton Hicks Contractions Contractions of the uterus can occur throughout pregnancy, but they are not always a sign that you are in labor. You may have practice contractions called Braxton Hicks contractions. These false labor contractions are sometimes confused with true labor. What are Braxton Hicks contractions? Braxton Hicks contractions are tightening movements that occur in the muscles of the uterus before labor. Unlike true labor contractions, these contractions do not result in opening (dilation) and thinning of the cervix. Toward the end of pregnancy (32-34 weeks), Braxton Hicks contractions can happen more often and may become stronger. These contractions are sometimes difficult to tell apart from true labor because they can be very uncomfortable. You should not feel embarrassed if you go to the hospital with false labor. Sometimes, the only way to tell if you are in true labor is for your health care provider to look for changes in the cervix. The health care provider will do a physical exam and may monitor your contractions. If you are not in true labor, the exam should show that your cervix is not dilating and your water has not broken. If there are no other health problems associated with your pregnancy, it is completely safe for you to be sent home with false labor. You may continue to have Braxton Hicks contractions until you go into true labor. How to tell the difference between true labor and false labor True labor  Contractions last 30-70 seconds.  Contractions become very regular.  Discomfort is usually felt in the top of the uterus, and it spreads to the lower abdomen and low back.  Contractions do not go away with walking.  Contractions usually become more intense and increase in frequency.  The cervix dilates and gets thinner. False labor  Contractions are usually shorter and not as strong as true labor contractions.  Contractions are usually irregular.  Contractions  are often felt in the front of the lower abdomen and in the groin.  Contractions may go away when you walk around or change positions while lying down.  Contractions get weaker and are shorter-lasting as time goes on.  The cervix usually does not dilate or become thin. Follow these instructions at home:   Take over-the-counter and prescription medicines only as told by your health care provider.  Keep up with your usual exercises and follow other instructions from your health care provider.  Eat and drink lightly if you think you are going into labor.  If Braxton Hicks contractions are making you uncomfortable: ? Change your position from lying down or resting to walking, or change from walking to resting. ? Sit and rest in a tub of warm water. ? Drink enough fluid to keep your urine pale yellow. Dehydration may cause these contractions. ? Do slow and deep breathing several times an hour.  Keep all follow-up prenatal visits as told by your health care provider. This is important. Contact a health care provider if:  You have a fever.  You have continuous pain in your abdomen. Get help right away if:  Your contractions become stronger, more regular, and closer together.  You have fluid leaking or gushing from your vagina.  You pass blood-tinged mucus (bloody show).  You have bleeding from your vagina.  You have low back pain that you never had before.  You feel your baby's head pushing down and causing pelvic pressure.  Your baby is not moving inside you as much as it used to. Summary  Contractions that occur before labor are   called Braxton Hicks contractions, false labor, or practice contractions.  Braxton Hicks contractions are usually shorter, weaker, farther apart, and less regular than true labor contractions. True labor contractions usually become progressively stronger and regular, and they become more frequent.  Manage discomfort from Braxton Hicks contractions  by changing position, resting in a warm bath, drinking plenty of water, or practicing deep breathing. This information is not intended to replace advice given to you by your health care provider. Make sure you discuss any questions you have with your health care provider. Document Released: 04/22/2017 Document Revised: 09/21/2017 Document Reviewed: 04/22/2017 Elsevier Interactive Patient Education  2019 [employer]  

## 2019-05-03 NOTE — MAU Note (Signed)
Pt here with c/o leaking fluid. Having some contractions. Was 2 cm in the office on Monday.

## 2019-05-04 ENCOUNTER — Telehealth: Payer: Self-pay | Admitting: *Deleted

## 2019-05-04 ENCOUNTER — Inpatient Hospital Stay (HOSPITAL_COMMUNITY)
Admission: AD | Admit: 2019-05-04 | Discharge: 2019-05-06 | DRG: 807 | Disposition: A | Discharge status: Home / Self Care | Payer: Medicare Other | Attending: Obstetrics & Gynecology | Admitting: Obstetrics & Gynecology

## 2019-05-04 ENCOUNTER — Encounter (HOSPITAL_COMMUNITY): Payer: Self-pay | Admitting: *Deleted

## 2019-05-04 ENCOUNTER — Other Ambulatory Visit: Payer: Self-pay

## 2019-05-04 DIAGNOSIS — A749 Chlamydial infection, unspecified: Secondary | ICD-10-CM

## 2019-05-04 DIAGNOSIS — Z8751 Personal history of pre-term labor: Secondary | ICD-10-CM

## 2019-05-04 DIAGNOSIS — O43199 Other malformation of placenta, unspecified trimester: Secondary | ICD-10-CM

## 2019-05-04 DIAGNOSIS — O99824 Streptococcus B carrier state complicating childbirth: Secondary | ICD-10-CM | POA: Diagnosis present

## 2019-05-04 DIAGNOSIS — A5901 Trichomonal vulvovaginitis: Secondary | ICD-10-CM

## 2019-05-04 DIAGNOSIS — Z3A39 39 weeks gestation of pregnancy: Secondary | ICD-10-CM

## 2019-05-04 DIAGNOSIS — O99820 Streptococcus B carrier state complicating pregnancy: Secondary | ICD-10-CM

## 2019-05-04 DIAGNOSIS — H548 Legal blindness, as defined in USA: Secondary | ICD-10-CM | POA: Diagnosis present

## 2019-05-04 DIAGNOSIS — O23591 Infection of other part of genital tract in pregnancy, first trimester: Secondary | ICD-10-CM

## 2019-05-04 DIAGNOSIS — Z3480 Encounter for supervision of other normal pregnancy, unspecified trimester: Secondary | ICD-10-CM

## 2019-05-04 DIAGNOSIS — O26893 Other specified pregnancy related conditions, third trimester: Secondary | ICD-10-CM | POA: Diagnosis not present

## 2019-05-04 HISTORY — DX: Papillomavirus as the cause of diseases classified elsewhere: B97.7

## 2019-05-04 LAB — CBC
HCT: 32.9 % — ABNORMAL LOW (ref 36.0–46.0)
Hemoglobin: 10.7 g/dL — ABNORMAL LOW (ref 12.0–15.0)
MCH: 28.4 pg (ref 26.0–34.0)
MCHC: 32.5 g/dL (ref 30.0–36.0)
MCV: 87.3 fL (ref 80.0–100.0)
Platelets: 220 10*3/uL (ref 150–400)
RBC: 3.77 MIL/uL — ABNORMAL LOW (ref 3.87–5.11)
RDW: 14.4 % (ref 11.5–15.5)
WBC: 9.3 10*3/uL (ref 4.0–10.5)
nRBC: 0 % (ref 0.0–0.2)

## 2019-05-04 LAB — TYPE AND SCREEN
ABO/RH(D): O POS
Antibody Screen: NEGATIVE

## 2019-05-04 MED ORDER — SOD CITRATE-CITRIC ACID 500-334 MG/5ML PO SOLN: 30.0000 mL | ORAL | Status: DC | PRN

## 2019-05-04 MED ORDER — OXYCODONE-ACETAMINOPHEN 5-325 MG PO TABS: 1.0000 | ORAL_TABLET | ORAL | Status: DC | PRN

## 2019-05-04 MED ORDER — DIBUCAINE (PERIANAL) 1 % EX OINT: 1.0000 "application " | TOPICAL_OINTMENT | CUTANEOUS | Status: DC | PRN

## 2019-05-04 MED ORDER — WITCH HAZEL-GLYCERIN EX PADS: 1.0000 "application " | MEDICATED_PAD | CUTANEOUS | Status: DC | PRN

## 2019-05-04 MED ORDER — ONDANSETRON HCL 4 MG PO TABS: 4.0000 mg | ORAL_TABLET | ORAL | Status: DC | PRN

## 2019-05-04 MED ORDER — BENZOCAINE-MENTHOL 20-0.5 % EX AERO: 1.0000 "application " | INHALATION_SPRAY | CUTANEOUS | Status: DC | PRN

## 2019-05-04 MED ORDER — SIMETHICONE 80 MG PO CHEW: 80.0000 mg | CHEWABLE_TABLET | ORAL | Status: DC | PRN

## 2019-05-04 MED ORDER — ACETAMINOPHEN 325 MG PO TABS
650.0000 mg | ORAL_TABLET | ORAL | Status: DC | PRN
  Administered 2019-05-04 – 2019-05-05 (×2): 650 mg via ORAL
  Filled 2019-05-04 (×2): qty 2

## 2019-05-04 MED ORDER — COCONUT OIL OIL: 1.0000 "application " | TOPICAL_OIL | Status: DC | PRN

## 2019-05-04 MED ORDER — SENNOSIDES-DOCUSATE SODIUM 8.6-50 MG PO TABS
2.0000 | ORAL_TABLET | ORAL | Status: DC
  Administered 2019-05-04: 2 via ORAL
  Filled 2019-05-04 (×2): qty 2

## 2019-05-04 MED ORDER — OXYTOCIN 40 UNITS IN NORMAL SALINE INFUSION - SIMPLE MED
2.5000 [IU]/h | INTRAVENOUS | Status: DC
  Filled 2019-05-04: qty 1000

## 2019-05-04 MED ORDER — LIDOCAINE HCL (PF) 1 % IJ SOLN: 30.0000 mL | INTRAMUSCULAR | Status: DC | PRN

## 2019-05-04 MED ORDER — LACTATED RINGERS IV SOLN
INTRAVENOUS | Status: DC
  Administered 2019-05-04: 18:00:00 via INTRAVENOUS

## 2019-05-04 MED ORDER — OXYTOCIN BOLUS FROM INFUSION
500.0000 mL | Freq: Once | INTRAVENOUS | Status: AC
  Administered 2019-05-04: 500 mL via INTRAVENOUS

## 2019-05-04 MED ORDER — IBUPROFEN 600 MG PO TABS
600.0000 mg | ORAL_TABLET | Freq: Four times a day (QID) | ORAL | Status: DC
  Administered 2019-05-05 (×2): 600 mg via ORAL
  Filled 2019-05-04 (×7): qty 1

## 2019-05-04 MED ORDER — EPHEDRINE 5 MG/ML INJ: 10.0000 mg | INTRAVENOUS | Status: DC | PRN

## 2019-05-04 MED ORDER — TETANUS-DIPHTH-ACELL PERTUSSIS 5-2.5-18.5 LF-MCG/0.5 IM SUSP: 0.5000 mL | Freq: Once | INTRAMUSCULAR | Status: DC

## 2019-05-04 MED ORDER — PHENYLEPHRINE 40 MCG/ML (10ML) SYRINGE FOR IV PUSH (FOR BLOOD PRESSURE SUPPORT): 80.0000 ug | PREFILLED_SYRINGE | INTRAVENOUS | Status: DC | PRN

## 2019-05-04 MED ORDER — OXYCODONE-ACETAMINOPHEN 5-325 MG PO TABS: 2.0000 | ORAL_TABLET | ORAL | Status: DC | PRN

## 2019-05-04 MED ORDER — SODIUM CHLORIDE 0.9 % IV SOLN
2.0000 g | Freq: Once | INTRAVENOUS | Status: AC
  Administered 2019-05-04: 2 g via INTRAVENOUS
  Filled 2019-05-04: qty 2000

## 2019-05-04 MED ORDER — LACTATED RINGERS IV SOLN: 500.0000 mL | Freq: Once | INTRAVENOUS | Status: DC

## 2019-05-04 MED ORDER — ZOLPIDEM TARTRATE 5 MG PO TABS: 5.0000 mg | ORAL_TABLET | Freq: Every evening | ORAL | Status: DC | PRN

## 2019-05-04 MED ORDER — LACTATED RINGERS IV SOLN: 500.0000 mL | INTRAVENOUS | Status: DC | PRN

## 2019-05-04 MED ORDER — FENTANYL CITRATE (PF) 100 MCG/2ML IJ SOLN: 100.0000 ug | Freq: Once | INTRAMUSCULAR | Status: DC

## 2019-05-04 MED ORDER — ONDANSETRON HCL 4 MG/2ML IJ SOLN: 4.0000 mg | Freq: Four times a day (QID) | INTRAMUSCULAR | Status: DC | PRN

## 2019-05-04 MED ORDER — FENTANYL-BUPIVACAINE-NACL 0.5-0.125-0.9 MG/250ML-% EP SOLN: 12.0000 mL/h | EPIDURAL | Status: DC | PRN

## 2019-05-04 MED ORDER — DIPHENHYDRAMINE HCL 25 MG PO CAPS: 25.0000 mg | ORAL_CAPSULE | Freq: Four times a day (QID) | ORAL | Status: DC | PRN

## 2019-05-04 MED ORDER — ACETAMINOPHEN 325 MG PO TABS: 650.0000 mg | ORAL_TABLET | ORAL | Status: DC | PRN

## 2019-05-04 MED ORDER — PRENATAL MULTIVITAMIN CH
1.0000 | ORAL_TABLET | Freq: Every day | ORAL | Status: DC
  Administered 2019-05-05 – 2019-05-06 (×2): 1 via ORAL
  Filled 2019-05-04 (×2): qty 1

## 2019-05-04 MED ORDER — DIPHENHYDRAMINE HCL 50 MG/ML IJ SOLN: 12.5000 mg | INTRAMUSCULAR | Status: DC | PRN

## 2019-05-04 MED ORDER — ONDANSETRON HCL 4 MG/2ML IJ SOLN: 4.0000 mg | INTRAMUSCULAR | Status: DC | PRN

## 2019-05-04 NOTE — Plan of Care (Signed)
Pt is new admission in active labor.  Problem: Education: Goal: Knowledge of Childbirth will improve Outcome: Progressing Goal: Ability to make informed decisions regarding treatment and plan of care will improve Outcome: Progressing Goal: Ability to state and carry out methods to decrease the pain will improve Outcome: Progressing Goal: Individualized Educational Video(s) Outcome: Progressing   Problem: Coping: Goal: Ability to verbalize concerns and feelings about labor and delivery will improve Outcome: Progressing   Problem: Life Cycle: Goal: Ability to make normal progression through stages of labor will improve Outcome: Progressing Goal: Ability to effectively push during vaginal delivery will improve Outcome: Progressing   Problem: Role Relationship: Goal: Will demonstrate positive interactions with the child Outcome: Progressing   Problem: Safety: Goal: Risk of complications during labor and delivery will decrease Outcome: Progressing   Problem: Pain Management: Goal: Relief or control of pain from uterine contractions will improve Outcome: Progressing   Problem: Education: Goal: Knowledge of General Education information will improve Description Including pain rating scale, medication(s)/side effects and non-pharmacologic comfort measures Outcome: Progressing   Problem: Health Behavior/Discharge Planning: Goal: Ability to manage health-related needs will improve Outcome: Progressing   Problem: Clinical Measurements: Goal: Ability to maintain clinical measurements within normal limits will improve Outcome: Progressing Goal: Will remain free from infection Outcome: Progressing Goal: Diagnostic test results will improve Outcome: Progressing Goal: Respiratory complications will improve Outcome: Progressing Goal: Cardiovascular complication will be avoided Outcome: Progressing   Problem: Activity: Goal: Risk for activity intolerance will decrease Outcome:  Progressing   Problem: Nutrition: Goal: Adequate nutrition will be maintained Outcome: Progressing   Problem: Coping: Goal: Level of anxiety will decrease Outcome: Progressing   Problem: Elimination: Goal: Will not experience complications related to bowel motility Outcome: Progressing Goal: Will not experience complications related to urinary retention Outcome: Progressing   Problem: Pain Managment: Goal: General experience of comfort will improve Outcome: Progressing   Problem: Safety: Goal: Ability to remain free from injury will improve Outcome: Progressing   Problem: Skin Integrity: Goal: Risk for impaired skin integrity will decrease Outcome: Progressing

## 2019-05-04 NOTE — Discharge Summary (Signed)
Postpartum Discharge Summary     Patient Name: Sylvia Thompson DOB: 1986-06-29 MRN: [id number]  Date of admission: 05/04/2019 Delivering Provider: Sharen Counter A   Date of discharge: 05/06/2019  Admitting diagnosis: pregnancy Intrauterine pregnancy: [redacted]w[redacted]d     Secondary diagnosis:  Active Problems:   Labor and delivery indication for care or intervention   NSVD (normal spontaneous vaginal delivery)  Additional problems: None     Discharge diagnosis: Term Pregnancy Delivered                                                                                                Post partum procedures:none  Augmentation: AROM  Complications: None  Hospital course:  Onset of Labor With Vaginal Delivery     33 y.o. yo [id number] at [redacted]w[redacted]d was admitted in Active Labor on 05/04/2019. Patient had an uncomplicated labor course as follows:  Membrane Rupture Time/Date: 5:57 PM ,05/04/2019   Intrapartum Procedures: Episiotomy: None [1]                                         Lacerations:  None [1]  Patient had a delivery of a Viable infant. 05/04/2019  Information for the patient's newborn:  Pennee, Mcilwain [[id number]]  Delivery Method: Vaginal, Spontaneous(Filed from Delivery Summary)    Pateint had an uncomplicated postpartum course.  She is ambulating, tolerating a regular diet, passing flatus, and urinating well. Patient is discharged home in stable condition on 05/06/19.   Magnesium Sulfate recieved: No BMZ received: No  Physical exam  Vitals:   05/05/19 0915 05/05/19 1433 05/05/19 2203 05/06/19 0521  BP: (!) 113/59 (!) 109/58 (!) 105/48 112/61  Pulse: 69 67 64 67  Resp: 18 18 18 18   Temp: 98.4 F (36.9 C) 98.6 F (37 C) 98.4 F (36.9 C) 98.4 F (36.9 C)  TempSrc: Oral Oral Oral Oral  SpO2:  100% 100%   Weight:      Height:       General: alert, cooperative and no distress Lochia: appropriate Uterine Fundus: firm Incision: N/A DVT Evaluation: No evidence of DVT seen  on physical exam. Labs: Lab Results  Component Value Date   WBC 9.3 05/04/2019   HGB 10.7 (L) 05/04/2019   HCT 32.9 (L) 05/04/2019   MCV 87.3 05/04/2019   PLT 220 05/04/2019   CMP Latest Ref Rng & Units 06/16/2017  Glucose 65 - 99 mg/dL 98  BUN 6 - 20 mg/dL 12  Creatinine 1.16 - 5.79 mg/dL 0.38  Sodium 333 - 832 mmol/L 141  Potassium 3.5 - 5.2 mmol/L 3.9  Chloride 96 - 106 mmol/L 102  CO2 20 - 29 mmol/L 24  Calcium 8.7 - 10.2 mg/dL 9.2  Total Protein 6.0 - 8.5 g/dL 7.4  Total Bilirubin 0.0 - 1.2 mg/dL 0.2  Alkaline Phos 39 - 117 IU/L 57  AST 0 - 40 IU/L 14  ALT 0 - 32 IU/L 9    Discharge instruction: per After Visit Summary and "Baby and Me  Booklet".  After visit meds:  Allergies as of 05/06/2019      Reactions   Timolol Shortness Of Breath   Latex Rash      Medication List    TAKE these medications   albuterol 108 (90 Base) MCG/ACT inhaler Commonly known as:  ProAir HFA Inhale 2 puffs into the lungs every 6 (six) hours as needed for wheezing or shortness of breath.   brimonidine 0.1 % Soln Commonly known as:  ALPHAGAN P Place 1 drop into the left eye 2 (two) times daily.   dorzolamide 2 % ophthalmic solution Commonly known as:  TRUSOPT Place 1 drop into both eyes 2 (two) times daily.   ferrous sulfate 325 (65 FE) MG tablet Take 1 tablet (325 mg total) by mouth 2 (two) times daily with a meal.   ibuprofen 800 MG tablet Commonly known as:  ADVIL Take 1 tablet (800 mg total) by mouth every 8 (eight) hours as needed.   PRENATAL VITAMINS PO Take by mouth.   SIMBRINZA OP Place 1 drop into the left eye at bedtime.       Diet: routine diet  Activity: Advance as tolerated. Pelvic rest for 6 weeks.   Outpatient follow up:4 weeks Follow up Appt: Future Appointments  Date Time Provider Department Center  06/05/2019  1:15 PM Conan Bowensavis, Kelly M, MD [facility] None   Follow up Visit:   Please schedule this patient for PP visit in: 4 weeks Low risk pregnancy  complicated by: none Delivery mode:  SVD Anticipated Birth Control:  other/unsure PP Procedures needed: none  Schedule Integrated BH visit: no Provider: Any provider      Newborn Data: Live born female  Birth Weight: 7 lb 1.2 oz (3210 g) APGAR: 9, 9  Newborn Delivery   Birth date/time:  05/04/2019 17:59:00 Delivery type:  Vaginal, Spontaneous     Baby Feeding: Breast Disposition:home with mother  Thressa ShellerHeather Hogan DNP, CNM  05/06/19  10:09 AM

## 2019-05-04 NOTE — MAU Note (Signed)
Pt refused covid testing.  

## 2019-05-04 NOTE — Telephone Encounter (Signed)
Pt called to office stating she doesn't know why she keeps getting scheduled for Covid testing.  Pt states she does not want to have testing done. Pt advised if she receives call about appt to make them aware so that they will not keep scheduling her. Pt made aware to let provider know at time of visit in order to discuss.  Pt states understanding.

## 2019-05-04 NOTE — H&P (Signed)
Sylvia Thompson is a 33 y.o. female [phone] @[redacted]w[redacted]d  presenting for active labor at term.    Nursing Staff Provider  Office Location  [facility] Dating   LMP  Language  ENGLISH  Anatomy US  wnl female, marg cord insertion  Flu Vaccine  Declined 10/19/18 Genetic Screen  NIPS: low risk  AFP: wnl   TDaP vaccine   Decline 02/07/2019 Hgb A1C or  GTT  Third trimester: [phone]  Rhogam     LAB RESULTS   Feeding Plan Both  Blood Type O/Positive/-- (10/30 1653) O POS  Contraception Undecided 05/01/19 Antibody Negative (10/30 1653) NEG  Circumcision N/a-female  Rubella 4.45 (10/30 1653) IMMUNE  Pediatrician  Undecided 05/01/19 RPR Non Reactive (02/18 0943) NR  Support Person FOB  HBsAg Negative (10/30 1653) NEG  Prenatal Classes Not Interested  HIV Non Reactive (02/18 0943) NR  BTL Consent  GBS Positive (04/20 1119)(For PCN allergy, check sensitivities)   VBAC Consent  Pap  10/19/18    Hgb Electro      CF     SMA     Waterbirth  [ ]  Class [ ]  Consent [ ]  CNM visit   OB History    Gravida  6   Para  3   Term  2   Preterm  1   AB  2   Living  3     SAB  2   TAB      Ectopic      Multiple      Live Births  3          Past Medical History:  Diagnosis Date  . Anemia   . Blindness, legal   . Cataract   . Gestational diabetes    2007  . Glaucoma   . Prosthetic eye globe    Past Surgical History:  Procedure Laterality Date  . DILATION AND CURETTAGE OF UTERUS    . EYE SURGERY     prosthetic right eye   Family History: family history includes Arthritis in her mother; Diabetes in her mother. Social History:  reports that she has never smoked. She has never used smokeless tobacco. She reports previous alcohol use. She reports that she does not use drugs.     Maternal Diabetes: No Genetic Screening: Normal Maternal Ultrasounds/Referrals: Normal Fetal Ultrasounds or other Referrals:  None Maternal Substance Abuse:  No Significant Maternal Medications:  None Significant  Maternal Lab Results:  Lab values include: Group B Strep positive Other Comments:  None  Review of Systems  Constitutional: Negative for chills and fever.  Respiratory: Negative for shortness of breath.   Cardiovascular: Negative for chest pain.  Gastrointestinal: Positive for abdominal pain. Negative for vomiting.  Neurological: Negative for dizziness and headaches.   Maternal Medical History:  Reason for admission: Contractions.   Contractions: Onset was 1-2 hours ago.   Frequency: regular.   Duration is approximately 1 minute.   Perceived severity is strong.    Fetal activity: Perceived fetal activity is normal.   Last perceived fetal movement was within the past hour.    Prenatal complications: no prenatal complications Prenatal Complications - Diabetes: none.    Dilation: 10 Effacement (%): 100 Station: Plus 2 Exam by:: Camy Leder leftwich-kirby,cnm Blood pressure 116/68, pulse 88, last menstrual period 08/02/2018. Maternal Exam:  Uterine Assessment: Contraction strength is moderate.  Contraction frequency is regular.   Abdomen: Fetal presentation: vertex  Cervix: Cervix evaluated by digital exam.     Fetal Exam Fetal Monitor  Review: Mode: ultrasound.   Variability: moderate (6-25 bpm).   Pattern: accelerations present and no decelerations.    Fetal State Assessment: Category I - tracings are normal.     Physical Exam  Nursing note and vitals reviewed. Constitutional: She is oriented to person, place, and time. She appears well-developed and well-nourished.  Neck: Normal range of motion.  Cardiovascular: Normal rate, regular rhythm and normal heart sounds.  Respiratory: Effort normal and breath sounds normal.  GI: Soft.  Musculoskeletal: Normal range of motion.  Neurological: She is alert and oriented to person, place, and time.  Skin: Skin is warm and dry.  Psychiatric: She has a normal mood and affect. Her behavior is normal. Judgment and thought content  normal.    Prenatal labs: ABO, Rh: O/Positive/-- (10/30 1653) Antibody: Negative (10/30 1653) Rubella: 4.45 (10/30 1653) RPR: Non Reactive (02/18 0943)  HBsAg: Negative (10/30 1653)  HIV: Non Reactive (02/18 0943)  GBS: Positive (04/20 1119)   Assessment/Plan: [id number] @[redacted]w[redacted]d  admitted for active labor at term GBS positive  Admit to L&D Ampicillin for advanced dilation for GBS prophylaxis Anticipate NSVD  Sharen CounterLisa Leftwich-Kirby 05/04/2019, 6:16 PM

## 2019-05-04 NOTE — MAU Note (Signed)
Pt presents with complaints of contractions that started early this morning. Denies any VB or LOF. +FM

## 2019-05-05 ENCOUNTER — Encounter (HOSPITAL_COMMUNITY): Payer: Self-pay | Admitting: *Deleted

## 2019-05-05 LAB — ABO/RH: ABO/RH(D): O POS

## 2019-05-05 LAB — RPR: RPR Ser Ql: NONREACTIVE

## 2019-05-05 NOTE — Lactation Note (Addendum)
This note was copied from a baby's chart. Lactation Consultation Note  Patient Name: Sylvia Thompson HUDJS'H Date: 05/05/2019  P4, 7 hour female infant. Mom is experienced at breastfeeding she breastfeed hr 1st child for one year, 2nd child for 10 months and 3rd child for six months. Mom feels infant has been latching well . LC discussed hand expression and mom taught back and infant was given 5 ml of colostrum on spoon. Mom latched infant on left breast using cross cradle hold, LC discussed good hand position mom using 'U hold or C hold to help support breast, infant latched well without difficulty, chin to breast and nose touching. Infant breastfeed for 15 minutes and was still breastfeeding when [facility] left room. LC discussed STS benefits with parents. Mom knows to breastfeed according hunger cues, 8 -12 times within 24 hours. LC discussed I & O. Reviewed Baby & Me book's Breastfeeding Basics.  Mom knows to call Nurse or LC if she has any breastfeeding questions, concerns or need assistance with latching infant to breast. Mom made aware of O/P services, breastfeeding support groups, community resources, and our phone # for post-discharge questions.     Maternal Data    Feeding Feeding Type: Breast Fed  LATCH Score Latch: Repeated attempts needed to sustain latch, nipple held in mouth throughout feeding, stimulation needed to elicit sucking reflex.  Audible Swallowing: A few with stimulation  Type of Nipple: Everted at rest and after stimulation  Comfort (Breast/Nipple): Soft / non-tender  Hold (Positioning): Assistance needed to correctly position infant at breast and maintain latch.  LATCH Score: 7  Interventions    Lactation Tools Discussed/Used     Consult Status      Danelle Earthly 05/05/2019, 2:07 AM

## 2019-05-05 NOTE — Progress Notes (Signed)
Post Partum Day 1 Subjective: Doing well, no complaints this morning. States pain well-controlled, minimal bleeding. Denies lightheadedness, dizziness.   Objective: Blood pressure (!) 78/57, pulse 67, temperature 98.6 F (37 C), temperature source Oral, resp. rate 18, height 5' (1.524 m), weight 82.1 kg, last menstrual period 08/02/2018, unknown if currently breastfeeding.  Physical Exam:  General: alert, well-appearing Lochia: appropriate Uterine Fundus: firm Incision: n/a DVT Evaluation: No significant calf/ankle edema  Recent Labs    05/04/19 1721  HGB 10.7*  HCT 32.9*    Assessment/Plan: Plan for discharge tomorrow  Continue lactation support Discussed need to stay until tomorrow given inadequate GBS treatment   LOS: 1 day   Sylvia Thompson S Shamell Suarez, DO 05/05/2019, 9:05 AM

## 2019-05-05 NOTE — Progress Notes (Addendum)
Pt refused COVID screening. RN informed pt on the importance of  Being tested. Pt verbalized understanding yet declined to be screened. Parents are informed to have mask on In room when Healthcare professionals are in the room. Parents are compliant and verbalized understanding.

## 2019-05-06 MED ORDER — IBUPROFEN 800 MG PO TABS: 800.0000 mg | ORAL_TABLET | Freq: Three times a day (TID) | ORAL | 0 refills | Status: DC | PRN

## 2019-05-06 NOTE — Lactation Note (Signed)
This note was copied from a baby's chart. Lactation Consultation Note  Patient Name: Sylvia Thompson [id number] Date: 05/06/2019 Reason for consult: Follow-up assessment;Infant weight loss P4, 34 hour female infant, weight loss -2%. Mom's feeding choice at admission was breast and bottle feeding. Per mom, infant is latching well and  infant last breastfeed at 4:30 am prior to [facility] entering the room for 20 minutes.  Infant has been cluster feeding and mom did give infant formula twice last night. Mom knows of possible risk with early pacifier usage, infant laying on mom's chest with purple pacifier in it's mouth.  Mom knows to continue to breastfeeding according to hunger cues and on demand. Mom knows to call Nurse or LC if she has any questions, concerns or needs assistance with latching infant to breast.   Maternal Data Formula Feeding for Exclusion: Yes Reason for exclusion: Mother's choice to formula and breast feed on admission  Feeding    LATCH Score                   Interventions    Lactation Tools Discussed/Used     Consult Status Consult Status: Follow-up Date: 05/06/19 Follow-up type: In-patient    Danelle Earthly 05/06/2019, 4:57 AM

## 2019-05-06 NOTE — Discharge Instructions (Signed)

## 2019-05-06 NOTE — Progress Notes (Signed)
Rn encouraged mom and dad to wear a mask. The Rn gave mom a mask.

## 2019-05-08 ENCOUNTER — Encounter: Payer: Medicare Other | Admitting: Advanced Practice Midwife

## 2019-05-10 ENCOUNTER — Other Ambulatory Visit (HOSPITAL_COMMUNITY): Payer: Medicare Other

## 2019-05-10 ENCOUNTER — Encounter: Payer: Medicare Other | Admitting: Obstetrics and Gynecology

## 2019-05-12 ENCOUNTER — Other Ambulatory Visit (HOSPITAL_COMMUNITY): Admission: RE | Admit: 2019-05-12 | Payer: Medicare Other | Source: Ambulatory Visit

## 2019-05-16 ENCOUNTER — Inpatient Hospital Stay (HOSPITAL_COMMUNITY): Payer: Medicare Other

## 2019-05-16 ENCOUNTER — Encounter: Payer: Medicare Other | Admitting: Obstetrics and Gynecology

## 2019-06-05 ENCOUNTER — Telehealth (INDEPENDENT_AMBULATORY_CARE_PROVIDER_SITE_OTHER): Payer: Medicaid Other | Admitting: Obstetrics

## 2019-06-05 ENCOUNTER — Encounter: Payer: Self-pay | Admitting: Obstetrics

## 2019-06-05 ENCOUNTER — Ambulatory Visit: Payer: Medicare Other | Admitting: Obstetrics and Gynecology

## 2019-06-05 DIAGNOSIS — Z3009 Encounter for other general counseling and advice on contraception: Secondary | ICD-10-CM

## 2019-06-05 DIAGNOSIS — M549 Dorsalgia, unspecified: Secondary | ICD-10-CM

## 2019-06-05 NOTE — Progress Notes (Signed)
TELEHEALTH VIRTUAL POSTPARTUM VISIT ENCOUNTER NOTE  I connected with@ on 06/05/19 at 10:00 AM EDT by telephone at home and verified that I am speaking with the correct person using two identifiers.   I discussed the limitations, risks, security and privacy concerns of performing an evaluation and management service by telephone and the availability of in person appointments. I also discussed with the patient that there may be a patient responsible charge related to this service. The patient expressed understanding and agreed to proceed.  Appointment Date: 06/05/2019  OBGYN Clinic: [id number]  Chief Complaint:  Chief Complaint  Patient presents with  . Postpartum Care    History of Present Illness: Sylvia Thompson is a 33 y.o. African-American [id number] (No LMP recorded.), seen for the above chief complaint. Her past medical history is significant for chlamydia and trichomonas infections.   She is s/p normal spontaneous vaginal delivery on 05/04/2019 at 39.2 weeks; she was discharged to home on 5/16/2020D#2. Pregnancy complicated by marginal umbilical cord insertion. Baby is doing well Unremarkable.  Complains of Back Pain 8-9/10  Vaginal bleeding or discharge: intermittent Mode of feeding infant: Breast and Bottle Intercourse: Yes  using condoms Contraception: condoms PP depression s/s: No .  Any bowel or bladder issues: No  Pap smear: no abnormalities (date: 10/19/2018)  Review of Systems: Positive for backache. Her 12 point review of systems is negative or as noted in the History of Present Illness.  Patient Active Problem List   Diagnosis Date Noted  . Labor and delivery indication for care or intervention 05/04/2019  . NSVD (normal spontaneous vaginal delivery) 05/04/2019  . GBS (group B Streptococcus carrier), +RV culture, currently pregnant 04/12/2019  . Chlamydia 01/03/2019  . Marginal insertion of umbilical cord affecting management of mother 12/12/2018  . Trichomonal  vaginitis in pregnancy, first trimester 11/02/2018  . History of preterm delivery- IOL for oligo  11/02/2018  . History of gestational diabetes 11/02/2018  . Supervision of other normal pregnancy, antepartum 10/19/2018  . Nausea and vomiting during pregnancy prior to [redacted] weeks gestation 10/19/2018  . 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 Blindness, legal     Medications Lew Dawes had no medications administered during this visit. Current Outpatient Medications  Medication Sig Dispense Refill  . albuterol (PROAIR HFA) 108 (90 Base) MCG/ACT inhaler Inhale 2 puffs into the lungs every 6 (six) hours as needed for wheezing or shortness of breath. 8 g 3  . brimonidine (ALPHAGAN P) 0.1 % SOLN Place 1 drop into the left eye 2 (two) times daily.    . Brinzolamide-Brimonidine (SIMBRINZA OP) Place 1 drop into the left eye at bedtime.    . dorzolamide (TRUSOPT) 2 % ophthalmic solution Place 1 drop into both eyes 2 (two) times daily.    . ferrous sulfate 325 (65 FE) MG tablet Take 1 tablet (325 mg total) by mouth 2 (two) times daily with a meal. (Patient not taking: Reported on 06/05/2019) 60 tablet 5  . ibuprofen (ADVIL) 800 MG tablet Take 1 tablet (800 mg total) by mouth every 8 (eight) hours as needed. 30 tablet 0  . Prenatal Vit-Fe Fumarate-FA (PRENATAL MULTIVITAMIN) TABS tablet Take 1 tablet by mouth daily at 12 noon.     No current facility-administered medications for this visit.     Allergies Timolol and Latex  Physical Exam:  General:  Alert, oriented and cooperative.   Mental Status: Normal mood and affect perceived. Normal judgment and thought content.  Rest of physical exam deferred  due to type of encounter  PP Depression Screening:   Edinburgh Postnatal Depression Scale - 06/05/19 1014      Edinburgh Postnatal Depression Scale:  In the Past 7 Days   I have been able to laugh and see the funny side of things.  0    I have looked forward with enjoyment to things.  0    I have blamed  myself unnecessarily when things went wrong.  0    I have been anxious or worried for no good reason.  0    I have felt scared or panicky for no good reason.  0    Things have been getting on top of me.  0    I have been so unhappy that I have had difficulty sleeping.  0    I have felt sad or miserable.  0    I have been so unhappy that I have been crying.  0    The thought of harming myself has occurred to me.  0    Edinburgh Postnatal Depression Scale Total  0       Assessment:Patient is a 33 y.o. [id number] who is 4 weeks postpartum from a normal spontaneous vaginal delivery.  She is doing well.   Plan: 1. Postpartum care following vaginal delivery  2. Backache symptom Rx: - Ambulatory referral to Chiropractic  3. Encounter for other general counseling and advice on contraception - undecided, considering options   RTC 2 weeks  I discussed the assessment and treatment plan with the patient. The patient was provided an opportunity to ask questions and all were answered. The patient agreed with the plan and demonstrated an understanding of the instructions.   The patient was advised to call back or seek an in-person evaluation/go to the ED for any concerning postpartum symptoms.  I provided 10 minutes of non-face-to-face time during this encounter.   Maretta Beesarol J Tamira Ryland, RMA [facility], [facility] 06-05-2019

## 2019-06-19 ENCOUNTER — Ambulatory Visit (INDEPENDENT_AMBULATORY_CARE_PROVIDER_SITE_OTHER): Payer: Medicare Other | Admitting: Obstetrics & Gynecology

## 2019-06-19 ENCOUNTER — Other Ambulatory Visit: Payer: Self-pay

## 2019-06-19 DIAGNOSIS — Z3202 Encounter for pregnancy test, result negative: Secondary | ICD-10-CM

## 2019-06-19 DIAGNOSIS — Z1389 Encounter for screening for other disorder: Secondary | ICD-10-CM | POA: Diagnosis not present

## 2019-06-19 LAB — POCT URINE PREGNANCY: Preg Test, Ur: NEGATIVE

## 2019-06-19 MED ORDER — NORETHINDRONE 0.35 MG PO TABS: 1.0000 | ORAL_TABLET | Freq: Every day | ORAL | 11 refills | Status: DC

## 2019-06-19 NOTE — Progress Notes (Signed)
Post Partum Exam  Sylvia Thompson is a 33 y.o. [id number] female who presents for a postpartum visit. She is 4 weeks postpartum following a spontaneous vaginal delivery. I have fully reviewed the prenatal and intrapartum course. The delivery was at 95 gestational weeks.  Anesthesia: none. Postpartum course has been doing well. Baby's course has been doing well. Baby is feeding by both breast and bottle - Marcos Eke. Bleeding staining only. Bowel function is normal. Bladder function is normal. Patient is sexually active. Contraception method is condoms, interested in pills. Postpartum depression screening:neg, score 1.   The following portions of the patient's history were reviewed and updated as appropriate: allergies, current medications, past family history, past medical history, past social history, past surgical history and problem list. Last pap smear done 09/2018 and was Normal  Review of Systems Cardiovascular: positive for chest pain and irregular heart beat    Objective:  currently breastfeeding.  General:  alert, cooperative and no distress   Breasts:  inspection negative, no nipple discharge or bleeding, no masses or nodularity palpable  Lungs: nl effort  Heart:  NlRR  Abdomen:     Vulva:  not evaluated                       Assessment:    6 week postpartum exam. Pap smear not done at today's visit.   Plan:   1. Contraception: oral progesterone-only contraceptive 2. Refer to IM for sx of arrhythmia 3. Follow up as needed.   Woodroe Mode, MD 06/19/2019

## 2019-06-19 NOTE — Patient Instructions (Signed)
Contraception Choices Contraception, also called birth control, refers to methods or devices that prevent pregnancy. Hormonal methods Contraceptive implant  A contraceptive implant is a thin, plastic tube that contains a hormone. It is inserted into the upper part of the arm. It can remain in place for up to 3 years. Progestin-only injections Progestin-only injections are injections of progestin, a synthetic form of the hormone progesterone. They are given every 3 months by a health care provider. Birth control pills  Birth control pills are pills that contain hormones that prevent pregnancy. They must be taken once a day, preferably at the same time each day. Birth control patch  The birth control patch contains hormones that prevent pregnancy. It is placed on the skin and must be changed once a week for three weeks and removed on the fourth week. A prescription is needed to use this method of contraception. Vaginal ring  A vaginal ring contains hormones that prevent pregnancy. It is placed in the vagina for three weeks and removed on the fourth week. After that, the process is repeated with a new ring. A prescription is needed to use this method of contraception. Emergency contraceptive Emergency contraceptives prevent pregnancy after unprotected sex. They come in pill form and can be taken up to 5 days after sex. They work best the sooner they are taken after having sex. Most emergency contraceptives are available without a prescription. This method should not be used as your only form of birth control. Barrier methods Female condom  A female condom is a thin sheath that is worn over the penis during sex. Condoms keep sperm from going inside a woman's body. They can be used with a spermicide to increase their effectiveness. They should be disposed after a single use. Female condom  A female condom is a soft, loose-fitting sheath that is put into the vagina before sex. The condom keeps sperm  from going inside a woman's body. They should be disposed after a single use. Diaphragm  A diaphragm is a soft, dome-shaped barrier. It is inserted into the vagina before sex, along with a spermicide. The diaphragm blocks sperm from entering the uterus, and the spermicide kills sperm. A diaphragm should be left in the vagina for 6-8 hours after sex and removed within 24 hours. A diaphragm is prescribed and fitted by a health care provider. A diaphragm should be replaced every 1-2 years, after giving birth, after gaining more than 15 lb (6.8 kg), and after pelvic surgery. Cervical cap  A cervical cap is a round, soft latex or plastic cup that fits over the cervix. It is inserted into the vagina before sex, along with spermicide. It blocks sperm from entering the uterus. The cap should be left in place for 6-8 hours after sex and removed within 48 hours. A cervical cap must be prescribed and fitted by a health care provider. It should be replaced every 2 years. Sponge  A sponge is a soft, circular piece of polyurethane foam with spermicide on it. The sponge helps block sperm from entering the uterus, and the spermicide kills sperm. To use it, you make it wet and then insert it into the vagina. It should be inserted before sex, left in for at least 6 hours after sex, and removed and thrown away within 30 hours. Spermicides Spermicides are chemicals that kill or block sperm from entering the cervix and uterus. They can come as a cream, jelly, suppository, foam, or tablet. A spermicide should be inserted into the   vagina with an applicator at least 10-15 minutes before sex to allow time for it to work. The process must be repeated every time you have sex. Spermicides do not require a prescription. Intrauterine contraception Intrauterine device (IUD) An IUD is a T-shaped device that is put in a woman's uterus. There are two types:  Hormone IUD.This type contains progestin, a synthetic form of the hormone  progesterone. This type can stay in place for 3-5 years.  Copper IUD.This type is wrapped in copper wire. It can stay in place for 10 years.  Permanent methods of contraception Female tubal ligation In this method, a woman's fallopian tubes are sealed, tied, or blocked during surgery to prevent eggs from traveling to the uterus. Hysteroscopic sterilization In this method, a small, flexible insert is placed into each fallopian tube. The inserts cause scar tissue to form in the fallopian tubes and block them, so sperm cannot reach an egg. The procedure takes about 3 months to be effective. Another form of birth control must be used during those 3 months. Female sterilization This is a procedure to tie off the tubes that carry sperm (vasectomy). After the procedure, the man can still ejaculate fluid (semen). Natural planning methods Natural family planning In this method, a couple does not have sex on days when the woman could become pregnant. Calendar method This means keeping track of the length of each menstrual cycle, identifying the days when pregnancy can happen, and not having sex on those days. Ovulation method In this method, a couple avoids sex during ovulation. Symptothermal method This method involves not having sex during ovulation. The woman typically checks for ovulation by watching changes in her temperature and in the consistency of cervical mucus. Post-ovulation method In this method, a couple waits to have sex until after ovulation. Summary  Contraception, also called birth control, means methods or devices that prevent pregnancy.  Hormonal methods of contraception include implants, injections, pills, patches, vaginal rings, and emergency contraceptives.  Barrier methods of contraception can include female condoms, female condoms, diaphragms, cervical caps, sponges, and spermicides.  There are two types of IUDs (intrauterine devices). An IUD can be put in a woman's uterus to  prevent pregnancy for 3-5 years.  Permanent sterilization can be done through a procedure for males, females, or both.  Natural family planning methods involve not having sex on days when the woman could become pregnant. This information is not intended to replace advice given to you by your health care provider. Make sure you discuss any questions you have with your health care provider. Document Released: 12/07/2005 Document Revised: 12/09/2017 Document Reviewed: 01/09/2017 Elsevier Patient Education  2020 [employer]  

## 2019-07-19 DIAGNOSIS — M9903 Segmental and somatic dysfunction of lumbar region: Secondary | ICD-10-CM | POA: Diagnosis not present

## 2019-07-19 DIAGNOSIS — M9902 Segmental and somatic dysfunction of thoracic region: Secondary | ICD-10-CM | POA: Diagnosis not present

## 2019-07-19 DIAGNOSIS — M545 Low back pain: Secondary | ICD-10-CM | POA: Diagnosis not present

## 2019-07-19 DIAGNOSIS — M546 Pain in thoracic spine: Secondary | ICD-10-CM | POA: Diagnosis not present

## 2019-07-21 DIAGNOSIS — M9902 Segmental and somatic dysfunction of thoracic region: Secondary | ICD-10-CM | POA: Diagnosis not present

## 2019-07-21 DIAGNOSIS — M546 Pain in thoracic spine: Secondary | ICD-10-CM | POA: Diagnosis not present

## 2019-07-21 DIAGNOSIS — M545 Low back pain: Secondary | ICD-10-CM | POA: Diagnosis not present

## 2019-07-21 DIAGNOSIS — M9903 Segmental and somatic dysfunction of lumbar region: Secondary | ICD-10-CM | POA: Diagnosis not present

## 2019-08-03 DIAGNOSIS — M546 Pain in thoracic spine: Secondary | ICD-10-CM | POA: Diagnosis not present

## 2019-08-03 DIAGNOSIS — M545 Low back pain: Secondary | ICD-10-CM | POA: Diagnosis not present

## 2019-08-03 DIAGNOSIS — M9903 Segmental and somatic dysfunction of lumbar region: Secondary | ICD-10-CM | POA: Diagnosis not present

## 2019-08-03 DIAGNOSIS — M9902 Segmental and somatic dysfunction of thoracic region: Secondary | ICD-10-CM | POA: Diagnosis not present

## 2019-09-06 IMAGING — US US MFM OB DETAIL+14 WK
1 of 2 series · 13 of 28 positions shown · non-contrast
Comparison: none

[Series 1: us mfm ob detail+14 wk · 90 acquisitions, 13 frames shown]
[im 4/90]
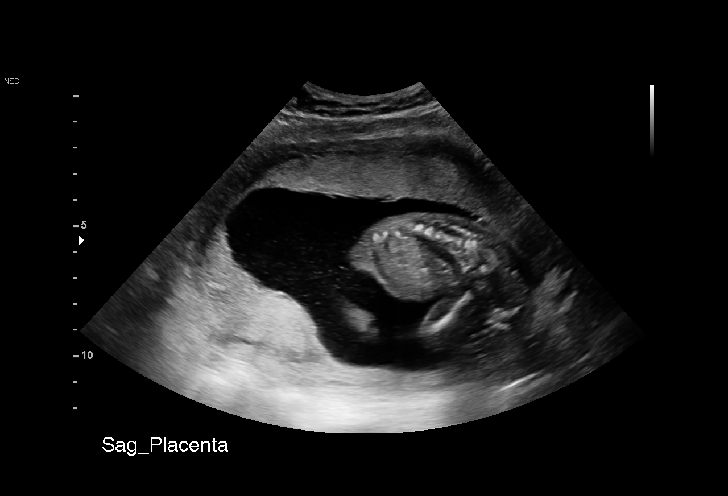
[im 11/90]
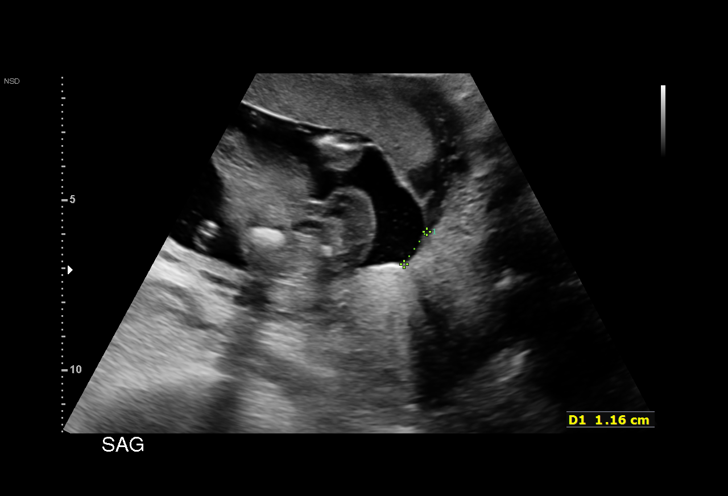
[im 18/90]
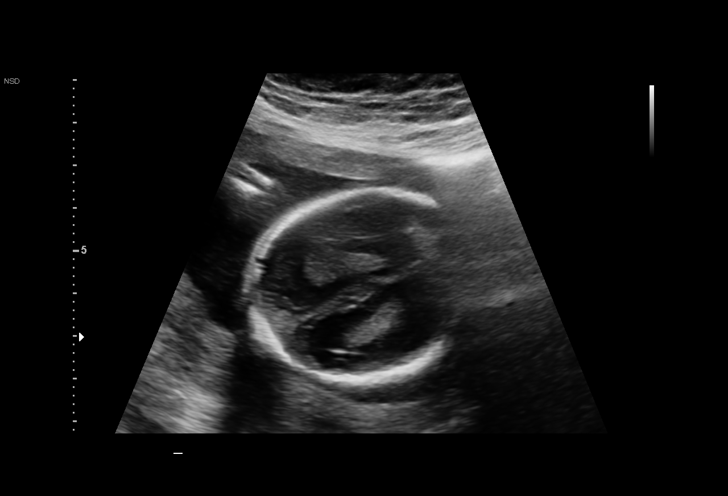
[im 24/90]
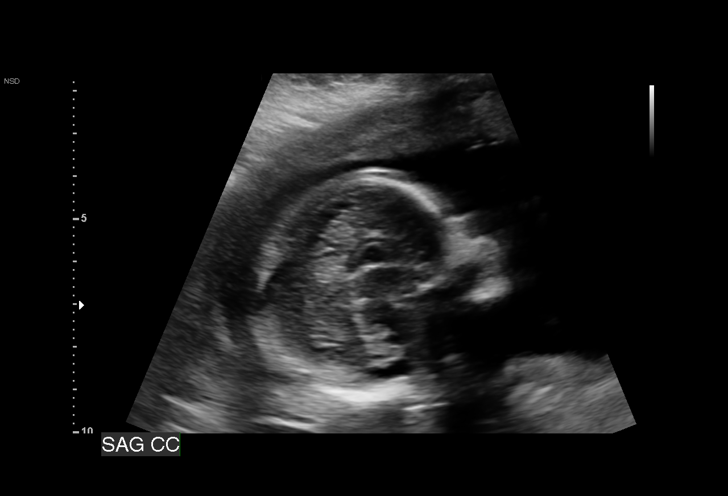
[im 31/90]
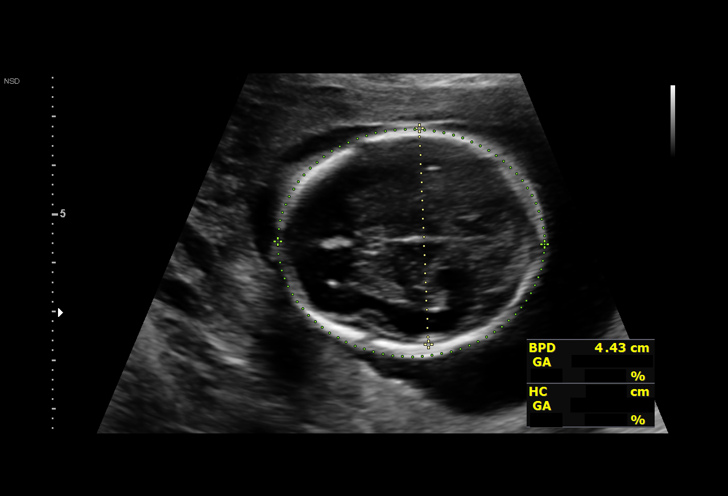
[im 38/90]
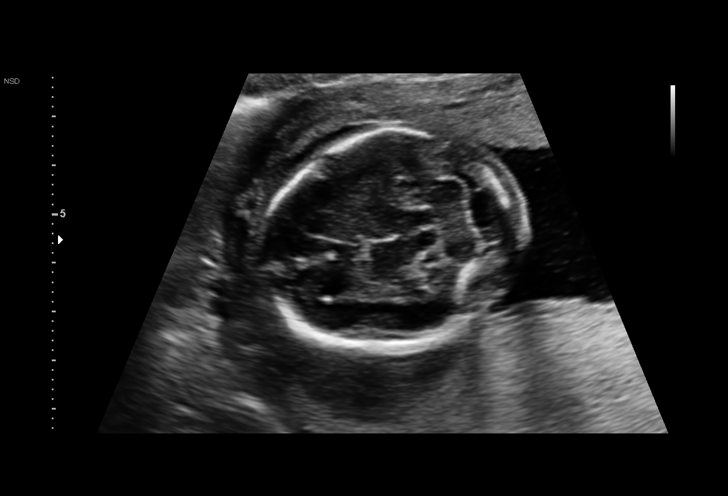
[im 48/90]
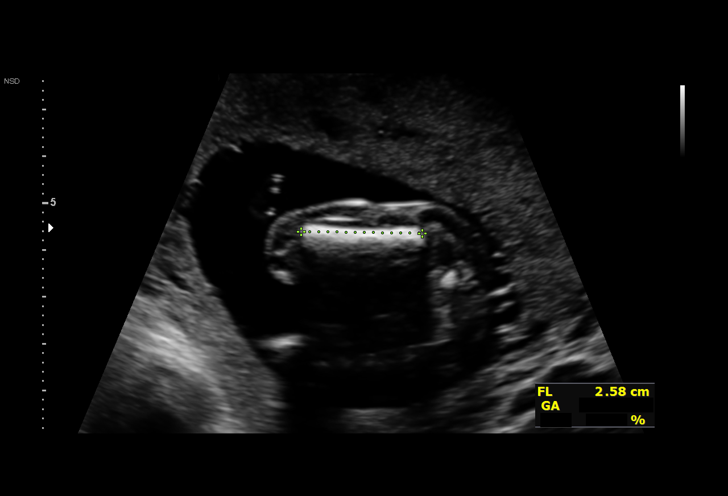
[im 55/90]
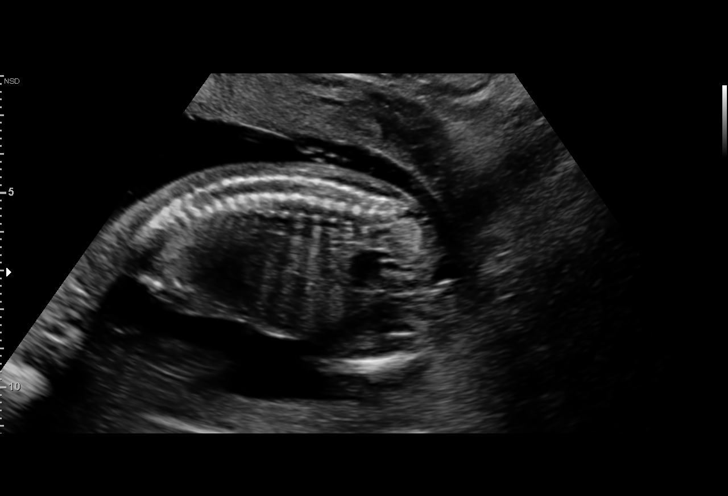
[im 62/90]
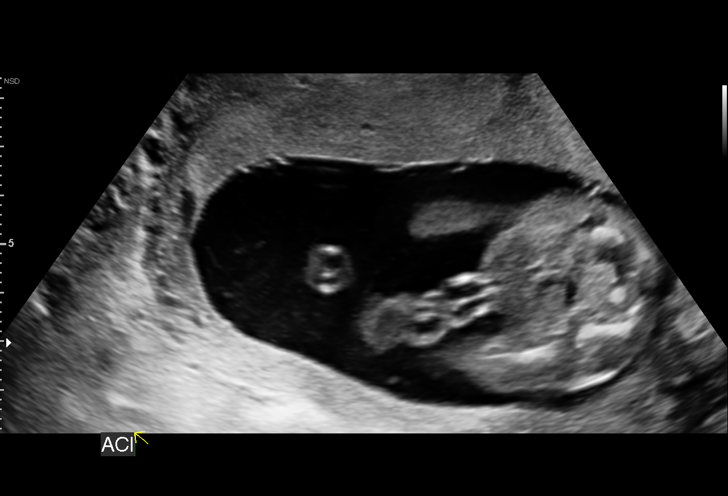
[im 69/90]
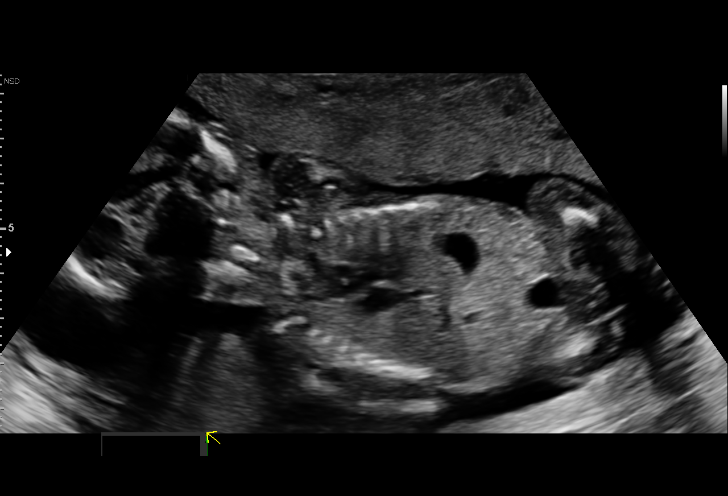
[im 76/90]
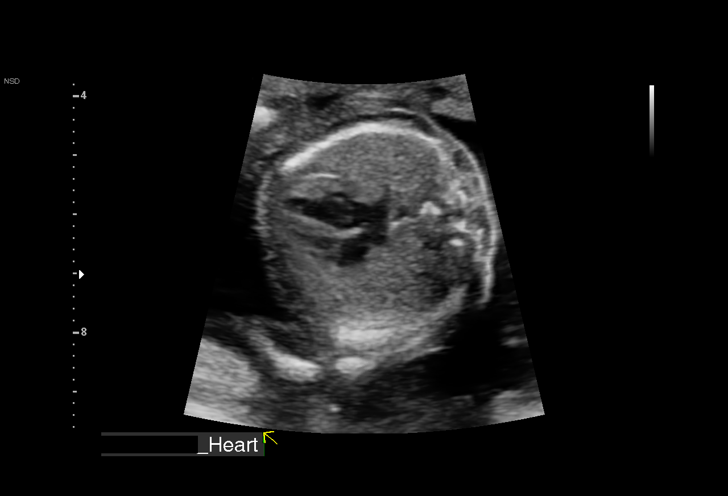
[im 83/90]
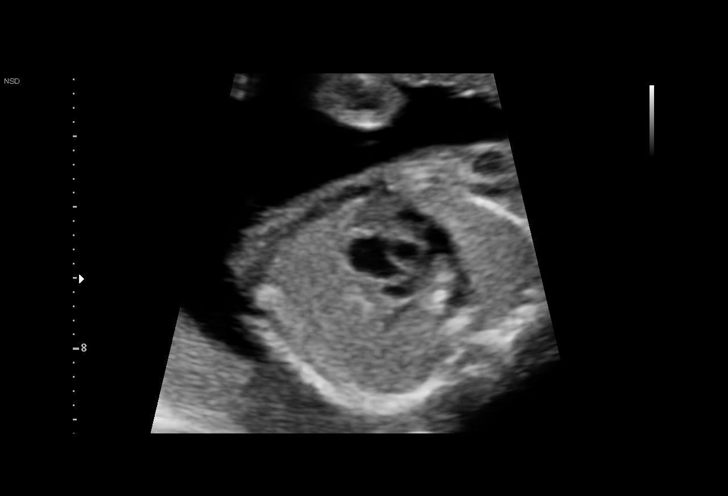
[im 90/90]
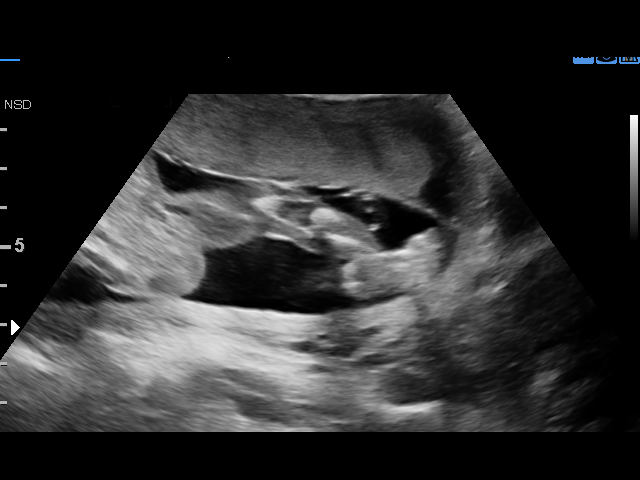

[13 of 28 positions shown; findings below may reference images not displayed]

[REDACTED]care - [HOSPITAL]

 ----------------------------------------------------------------------

 ----------------------------------------------------------------------
Indications

  18 weeks gestation of pregnancy
  Encounter for antenatal screening for
  malformations (LOW risk NIPS)
  Medical complication of pregnancy
  (Trichomonal Vaginitis in first trimester)
  Poor obstetric history: Previous preterm
  delivery, antepartum (IOL for Oligo)
  Poor obstetric history: Previous gestational
  diabetes
 ----------------------------------------------------------------------
Fetal Evaluation

 Num Of Fetuses:         1
 Fetal Heart Rate(bpm):  143
 Cardiac Activity:       Observed
 Presentation:           Breech
 Placenta:               Anterior, low-lying, 0.8cm from int os
 P. Cord Insertion:      Marginal insertion

 Amniotic Fluid
 AFI FV:      Within normal limits

                             Largest Pocket(cm)

Biometry

 BPD:      44.3  mm     G. Age:  19w 3d         73  %    CI:        77.15   %    70 - 86
                                                         FL/HC:      16.2   %    16.1 -
 HC:      159.7  mm     G. Age:  18w 6d         41  %    HC/AC:      1.10        1.09 -
 AC:      145.1  mm     G. Age:  19w 6d         77  %    FL/BPD:     58.2   %
 FL:       25.8  mm     G. Age:  17w 6d         13  %    FL/AC:      17.8   %    20 - 24
 HUM:      24.4  mm     G. Age:  17w 4d         14  %
 CER:      19.4  mm     G. Age:  18w 5d         45  %
 NFT:       2.3  mm

 LV:        6.4  mm
 CM:        7.9  mm

 Est. FW:     263  gm      0 lb 9 oz     46  %
OB History

 Gravidity:    6         Term:   2        Prem:   1        SAB:   2
 TOP:          0       Ectopic:  0        Living: 3
Gestational Age

 LMP:           18w 6d        Date:  08/02/18                 EDD:   05/09/19
 U/S Today:     19w 0d                                        EDD:   05/08/19
 Best:          18w 6d     Det. By:  LMP  (08/02/18)          EDD:   05/09/19
Anatomy

 Cranium:               Appears normal         LVOT:                   Appears normal
 Cavum:                 Appears normal         Aortic Arch:            Appears normal
 Ventricles:            Appears normal         Ductal Arch:            Appears normal
 Choroid Plexus:        Appears normal         Diaphragm:              Appears normal
 Cerebellum:            Appears normal         Stomach:                Appears normal, left
                                                                       sided
 Posterior Fossa:       Appears normal         Abdomen:                Appears normal
 Nuchal Fold:           Appears normal         Abdominal Wall:         Appears nml (cord
                                                                       insert, abd wall)
 Face:                  Appears normal         Cord Vessels:           Appears normal (3
                        (orbits and profile)                           vessel cord)
 Lips:                  Appears normal         Kidneys:                Appear normal
 Palate:                Not well visualized    Bladder:                Appears normal
 Thoracic:              Appears normal         Spine:                  Appears normal
 Heart:                 Appears normal         Upper Extremities:      Visualized
                        (4CH, axis, and situs
 RVOT:                  Appears normal         Lower Extremities:      Visualized

 Other:  Female gender (NIPS). Nasal bone visualized. Open hands visualized.
Cervix Uterus Adnexa

 Cervix
 Length:            3.4  cm.
 Normal appearance by transabdominal scan.

 Left Ovary
 Within normal limits.

 Right Ovary
 Within normal limits.

 Adnexa
 No abnormality visualized.
Impression

 G4 P3. Patient is here for fetal anatomy scan. On cell-free
 fetal DNA screening, the risks of fetal aneuploidies are not
 increased.

 We performed a fetal anatomy scan. No markers of
 aneuploidies or fetal structural defects are seen. Fetal
 biometry is consistent with her previously-established dates.
 Amniotic fluid is normal and good fetal activity is seen.
 Patient understands the limitations of ultrasound in detecting
 fetal anomalies.

 Placenta is anterior and low-lying. Marginal cord insertion is
 seen.

 Patient does not give history of vaginal bleeding. I informed
 the patient that in a majority of cases low-lying placenta
 resolves with advancing gestation.
 I also informed her that marginal cord insertion can be
 associated with fetal growth restriction in some cases and we
 recommend serial fetal growth assessments.
Recommendations

 -An appointment was made for her to return in 6 weeks for
 fetal growth assessment.
                 Tiger, Pozisa

## 2019-10-18 IMAGING — US US MFM OB FOLLOW-UP
1 series · 14 of 28 positions shown · non-contrast
Comparison: none

[Series 1: us mfm ob follow-up · 36 acquisitions, 14 frames shown]
[im 2/36]
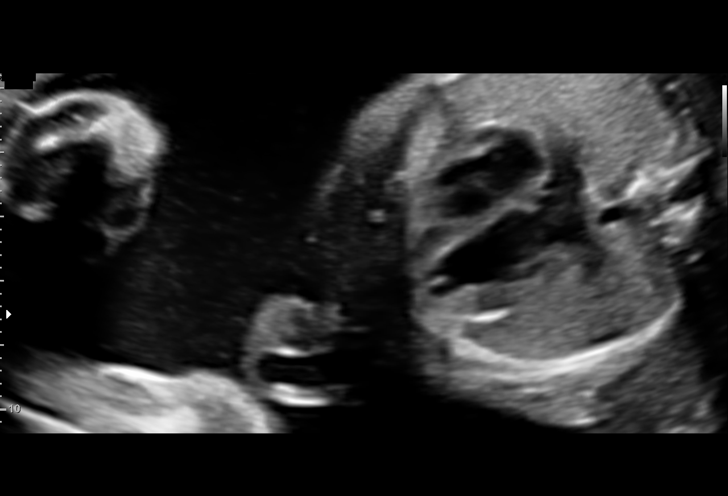
[im 4/36]
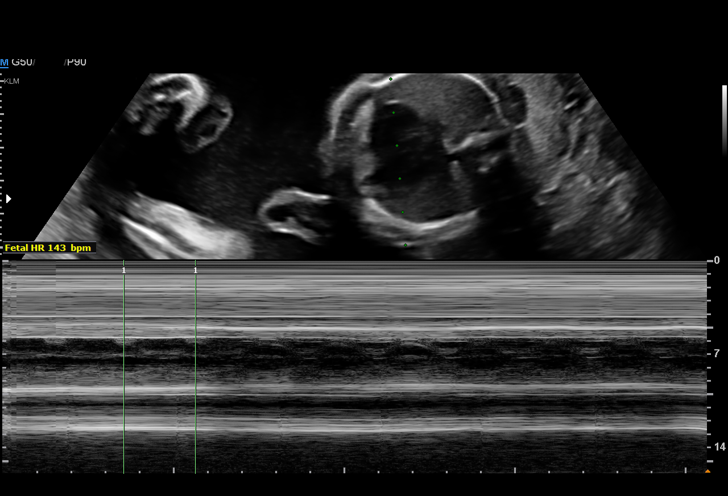
[im 7/36]
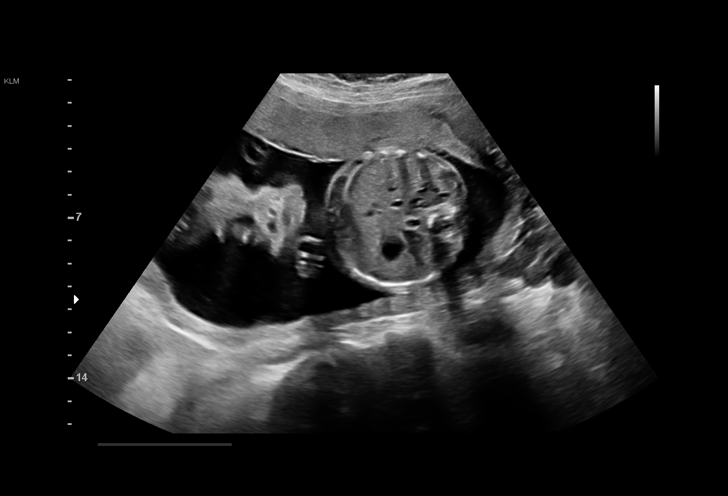
[im 10/36]
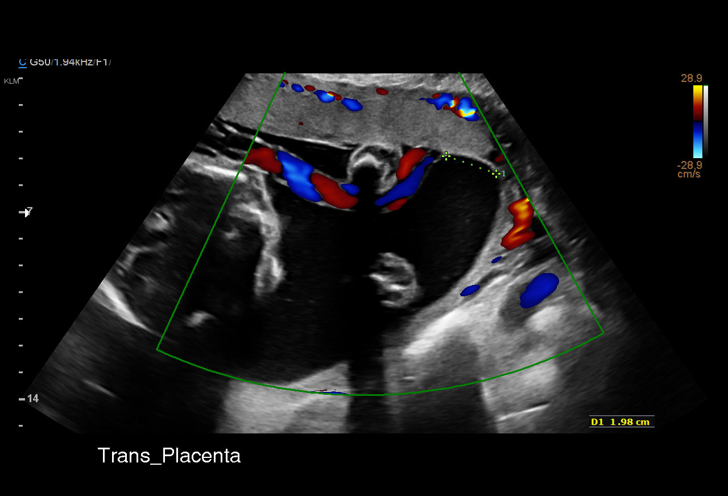
[im 12/36]
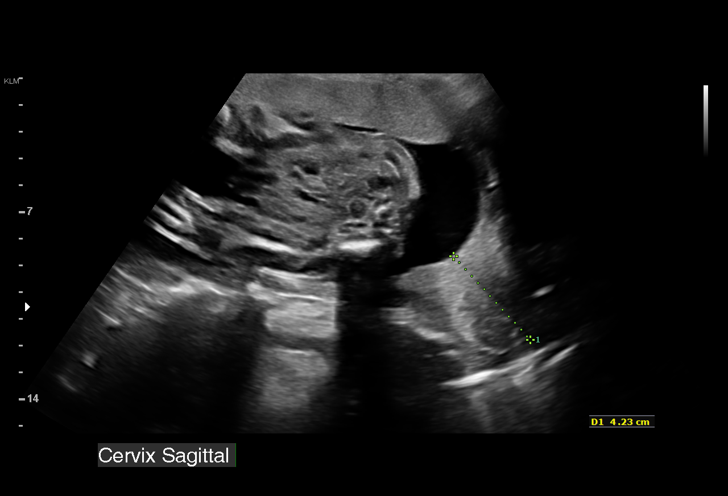
[im 15/36]
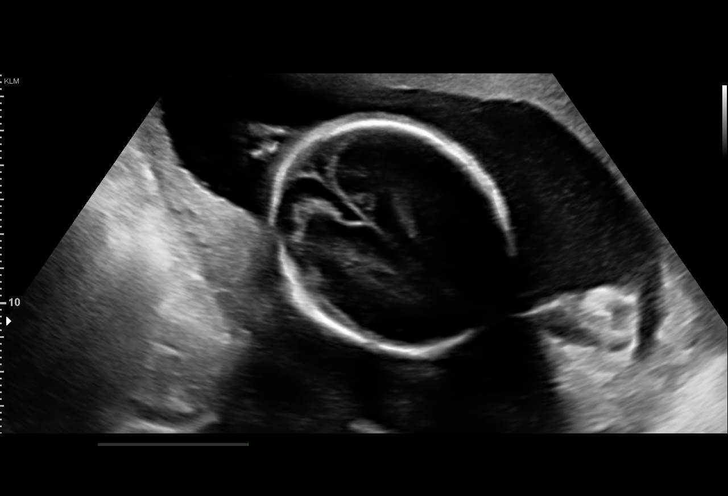
[im 17/36]
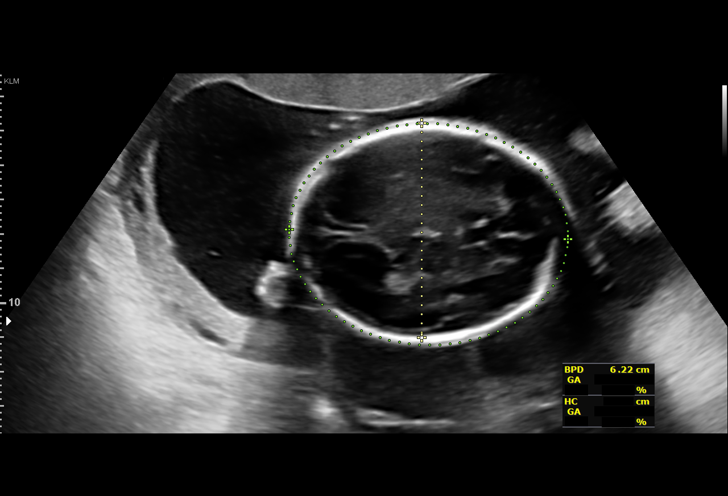
[im 20/36]
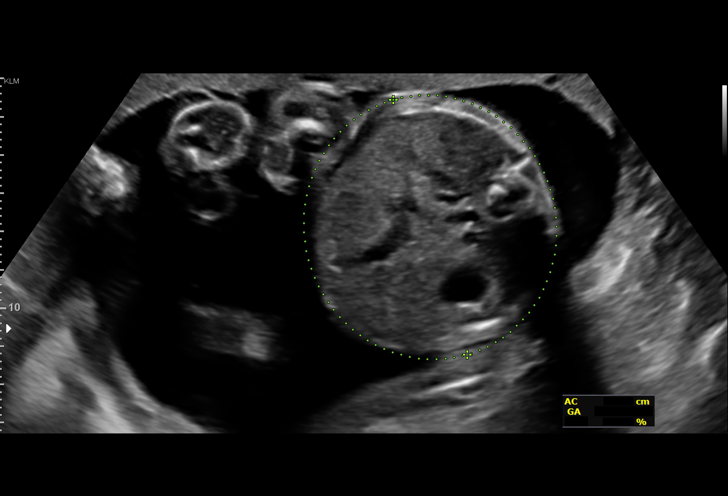
[im 23/36]
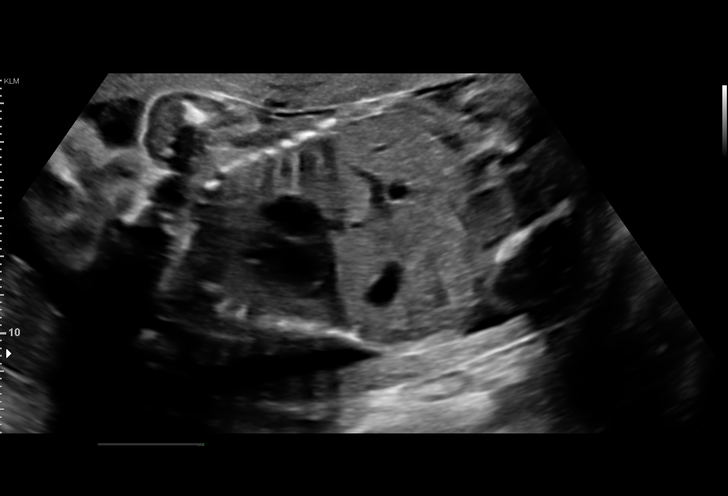
[im 25/36]
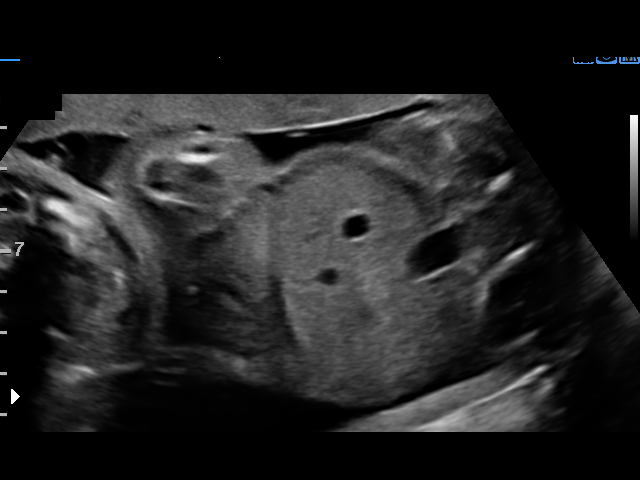
[im 28/36]
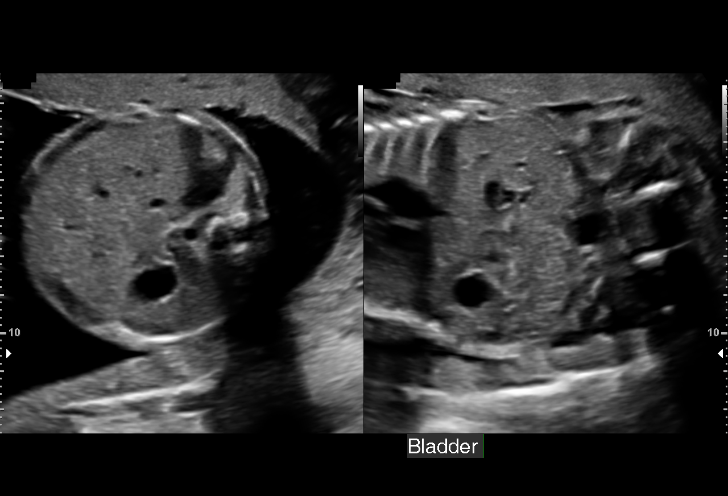
[im 30/36]
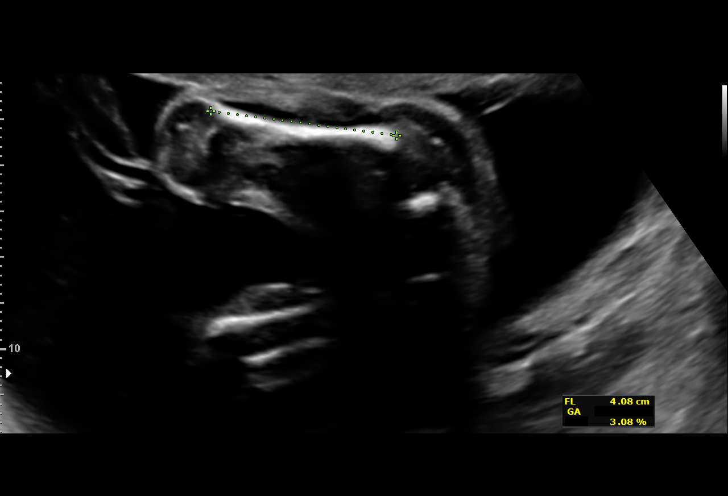
[im 33/36]
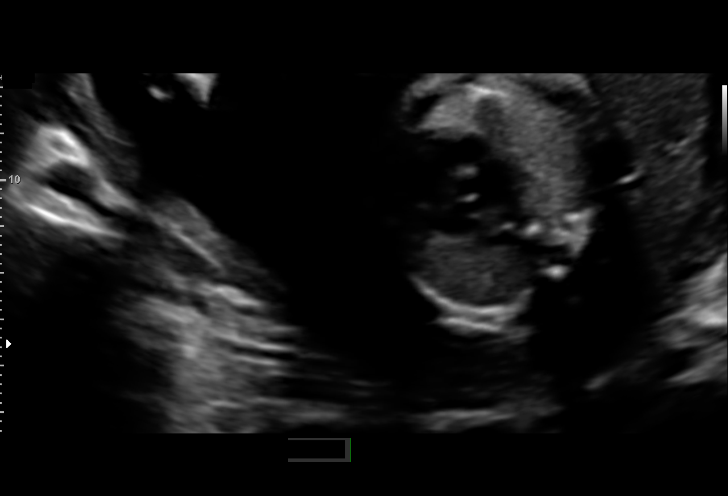
[im 36/36]
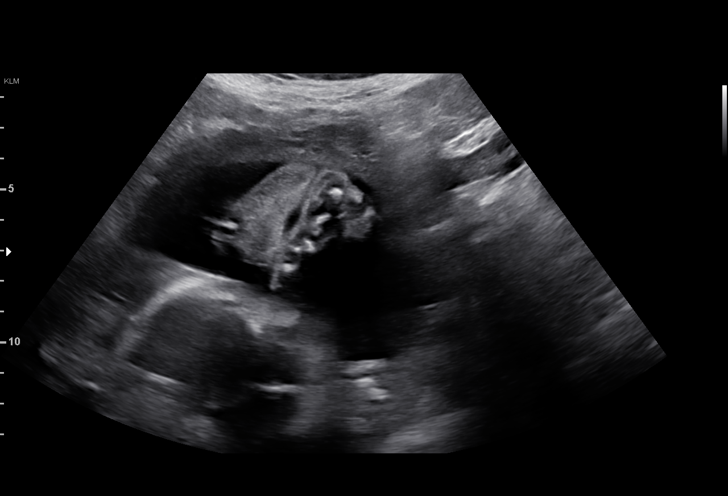

[14 of 28 positions shown; findings below may reference images not displayed]

[REDACTED]care - [HOSPITAL]

 ----------------------------------------------------------------------

 ----------------------------------------------------------------------
Indications

  Low lying placenta, antepartum (resolved)
  24 weeks gestation of pregnancy
  Poor obstetric history: Previous preterm
  delivery, antepartum (IOL for Oligo)
  Poor obstetric history: Previous gestational
  diabetes
 ----------------------------------------------------------------------
Fetal Evaluation

 Num Of Fetuses:          1
 Fetal Heart Rate(bpm):   143
 Cardiac Activity:        Observed
 Presentation:            Transverse, head to maternal right
 Placenta:                Anterior
 P. Cord Insertion:       Marginal insertion

 Amniotic Fluid
 AFI FV:      Within normal limits

                             Largest Pocket(cm)
                             5
Biometry

 BPD:      62.2  mm     G. Age:  25w 2d         56  %    CI:        74.07   %    70 - 86
                                                         FL/HC:       18.0  %    18.7 -
 HC:      229.5  mm     G. Age:  25w 0d         34  %    HC/AC:       1.07       1.04 -
 AC:      214.3  mm     G. Age:  26w 0d         74  %    FL/BPD:      66.4  %    71 - 87
 FL:       41.3  mm     G. Age:  23w 3d          6  %    FL/AC:       19.3  %    20 - 24
 Est. FW:     747   gm   1 lb 10 oz      54  %
OB History

 Gravidity:    6         Term:   2        Prem:   1        SAB:   2
 TOP:          0       Ectopic:  0        Living: 3
Gestational Age

 LMP:           24w 6d        Date:  08/02/18                 EDD:   05/09/19
 U/S Today:     25w 0d                                        EDD:   05/08/19
 Best:          24w 6d     Det. By:  LMP  (08/02/18)          EDD:   05/09/19
Anatomy

 Cranium:               Appears normal         Aortic Arch:            Previously seen
 Cavum:                 Appears normal         Ductal Arch:            Previously seen
 Ventricles:            Appears normal         Diaphragm:              Appears normal
 Choroid Plexus:        Appears normal         Stomach:                Appears normal, left
                                                                       sided
 Cerebellum:            Previously seen        Abdomen:                Appears normal
 Posterior Fossa:       Previously seen        Abdominal Wall:         Previously seen
 Nuchal Fold:           Previously seen        Cord Vessels:           Previously seen
 Face:                  Orbits and profile     Kidneys:                Appear normal
                        previously seen
 Lips:                  Previously seen        Bladder:                Appears normal
 Thoracic:              Appears normal         Spine:                  Previously seen
 Heart:                 Appears normal         Upper Extremities:      Previously seen
                        (4CH, axis, and situs
 RVOT:                  Appears normal         Lower Extremities:      Previously seen
 LVOT:                  Appears normal

 Other:  Female gender (NIPS). Nasal bone previously visualized. Open
         hands previously visualized.
Cervix Uterus Adnexa

 Cervix
 Length:            4.2  cm.
 Normal appearance by transabdominal scan.
Impression

 Normal interval growth.
 Resolved low lying placenta
 Marginal Cord Insertion.
Recommendations

 Follow up growth in 4 weeks to for the indication of marginal
 cord insertion.

## 2019-11-10 ENCOUNTER — Other Ambulatory Visit: Payer: Self-pay

## 2019-11-10 ENCOUNTER — Ambulatory Visit (HOSPITAL_BASED_OUTPATIENT_CLINIC_OR_DEPARTMENT_OTHER): Payer: Medicare Other | Admitting: Family Medicine

## 2019-11-10 ENCOUNTER — Encounter: Payer: Self-pay | Admitting: Family Medicine

## 2019-11-10 ENCOUNTER — Other Ambulatory Visit (HOSPITAL_COMMUNITY)
Admission: RE | Admit: 2019-11-10 | Discharge: 2019-11-10 | Disposition: A | Discharge status: Home / Self Care | Payer: Medicare Other | Source: Ambulatory Visit | Attending: Family Medicine | Admitting: Family Medicine

## 2019-11-10 VITALS — BP 120/76 | HR 83 | Temp 99.7°F | Resp 18 | Ht 62.0 in | Wt 183.0 lb

## 2019-11-10 DIAGNOSIS — Z8632 Personal history of gestational diabetes: Secondary | ICD-10-CM

## 2019-11-10 DIAGNOSIS — N898 Other specified noninflammatory disorders of vagina: Secondary | ICD-10-CM

## 2019-11-10 MED ORDER — FLUCONAZOLE 150 MG PO TABS: ORAL_TABLET | ORAL | 1 refills | Status: DC

## 2019-11-10 MED ORDER — METRONIDAZOLE 500 MG PO TABS: 500.0000 mg | ORAL_TABLET | Freq: Two times a day (BID) | ORAL | 0 refills | Status: DC

## 2019-11-10 NOTE — Progress Notes (Signed)
Established Patient Office Visit  Subjective:  Patient ID: Sylvia Thompson, female    DOB: Jan 12, 1986  Age: 33 y.o. MRN: [id number]  CC: No chief complaint on file.   HPI Sylvia Thompson presents to re-establish care. Last pap smear per chart was 10/19/2018 and normal but patient also had positive cervicovaginal ancillary test for trichomonas, bacterial and candidal vaginitis. She was last seen in the office on 06/16/2018.        At today's visit, she reports that she is here for a pap smear and that she is having vaginal discharge that is slightly itchy and smells like yeast. She denies any abdominal, vaginal or pelvic pain. Some urinary frequency but no dysuria. She additionally has had gestational diabetes in the past. No other concerns today.   Past Medical History:  Diagnosis Date  . Anemia   . Blindness, legal   . Cataract   . Gestational diabetes    2007  . Glaucoma   . HPV (human papilloma virus) infection   . Prosthetic eye globe     Past Surgical History:  Procedure Laterality Date  . DILATION AND CURETTAGE OF UTERUS    . EYE SURGERY     prosthetic right eye    Family History  Problem Relation Age of Onset  . Diabetes Mother   . Arthritis Mother     Social History   Socioeconomic History  . Marital status: Divorced    Spouse name: Not on file  . Number of children: Not on file  . Years of education: Not on file  . Highest education level: Not on file  Occupational History  . Not on file  Social Needs  . Financial resource strain: Not hard at all  . Food insecurity    Worry: Never true    Inability: Never true  . Transportation needs    Medical: No    Non-medical: Not on file  Tobacco Use  . Smoking status: Never Smoker  . Smokeless tobacco: Never Used  Substance and Sexual Activity  . Alcohol use: Not Currently  . Drug use: No  . Sexual activity: Yes    Partners: Male    Birth control/protection: None    Comment: pregnant   Lifestyle   . Physical activity    Days per week: Not on file    Minutes per session: Not on file  . Stress: Only a little  Relationships  . Social Herbalist on phone: Not on file    Gets together: Not on file    Attends religious service: Not on file    Active member of club or organization: Not on file    Attends meetings of clubs or organizations: Not on file    Relationship status: Not on file  . Intimate partner violence    Fear of current or ex partner: No    Emotionally abused: No    Physically abused: No    Forced sexual activity: No  Other Topics Concern  . Not on file  Social History Narrative  . Not on file    Outpatient Medications Prior to Visit  Medication Sig Dispense Refill  . albuterol (PROAIR HFA) 108 (90 Base) MCG/ACT inhaler Inhale 2 puffs into the lungs every 6 (six) hours as needed for wheezing or shortness of breath. 8 g 3  . brimonidine (ALPHAGAN P) 0.1 % SOLN Place 1 drop into the left eye 2 (two) times daily.    . Brinzolamide-Brimonidine (  SIMBRINZA OP) Place 1 drop into the left eye at bedtime.    . dorzolamide (TRUSOPT) 2 % ophthalmic solution Place 1 drop into both eyes 2 (two) times daily.    . ferrous sulfate 325 (65 FE) MG tablet Take 1 tablet (325 mg total) by mouth 2 (two) times daily with a meal. (Patient not taking: Reported on 06/05/2019) 60 tablet 5  . ibuprofen (ADVIL) 800 MG tablet Take 1 tablet (800 mg total) by mouth every 8 (eight) hours as needed. 30 tablet 0  . norethindrone (MICRONOR) 0.35 MG tablet Take 1 tablet (0.35 mg total) by mouth daily. 1 Package 11  . Prenatal Vit-Fe Fumarate-FA (PRENATAL MULTIVITAMIN) TABS tablet Take 1 tablet by mouth daily at 12 noon.     No facility-administered medications prior to visit.     Allergies  Allergen Reactions  . Timolol Shortness Of Breath  . Latex Rash    ROS Review of Systems  Constitutional: Negative for chills and fever.  HENT: Negative for sore throat and trouble swallowing.    Eyes: Positive for visual disturbance (legally blind due to vision disorder). Negative for photophobia.  Respiratory: Negative for cough and shortness of breath.   Cardiovascular: Negative for chest pain, palpitations and leg swelling.  Gastrointestinal: Negative for abdominal pain, constipation, diarrhea and nausea.  Endocrine: Negative for polydipsia, polyphagia and polyuria.  Genitourinary: Positive for vaginal discharge. Negative for dysuria, frequency, vaginal bleeding and vaginal pain.  Musculoskeletal: Negative for back pain and gait problem.  Neurological: Negative for dizziness and headaches.  Hematological: Negative for adenopathy. Does not bruise/bleed easily.      Objective:    Physical Exam  Constitutional: She is oriented to person, place, and time. She appears well-developed and well-nourished.  Cardiovascular: Normal rate and regular rhythm.  Pulmonary/Chest: Effort normal and breath sounds normal.  Abdominal: Soft. There is no abdominal tenderness. There is no rebound and no guarding.  Genitourinary:    Vaginal discharge present.     Genitourinary Comments: Normal external genitalia; whitish discharge in the vaginal canal and cervical vault; no adnexal tenderness and no cervical motion tenderness   Musculoskeletal:        General: No tenderness or edema.     Comments: No CVA tenderness  Neurological: She is alert and oriented to person, place, and time.  Nursing note and vitals reviewed.   BP 120/76 (BP Location: Left Arm, Patient Position: Sitting, Cuff Size: Large)   Pulse 83   Temp 99.7 F (37.6 C) (Oral)   Resp 18   Ht 5\' 2"  (1.575 m)   Wt 183 lb (83 kg)   LMP 11/06/2019   SpO2 99%   BMI 33.47 kg/m  Wt Readings from Last 3 Encounters:  06/19/19 172 lb (78 kg)  05/04/19 181 lb (82.1 kg)  05/03/19 181 lb (82.1 kg)     Health Maintenance Due  Topic Date Due  . INFLUENZA VACCINE  07/22/2019      Lab Results  Component Value Date   TSH 2.690  07/15/2017   Lab Results  Component Value Date   WBC 9.3 05/04/2019   HGB 10.7 (L) 05/04/2019   HCT 32.9 (L) 05/04/2019   MCV 87.3 05/04/2019   PLT 220 05/04/2019   Lab Results  Component Value Date   NA 141 06/16/2017   K 3.9 06/16/2017   CO2 24 06/16/2017   GLUCOSE 98 06/16/2017   BUN 12 06/16/2017   CREATININE 0.71 06/16/2017   BILITOT 0.2 06/16/2017  ALKPHOS 57 06/16/2017   AST 14 06/16/2017   ALT 9 06/16/2017   PROT 7.4 06/16/2017   ALBUMIN 4.0 06/16/2017   CALCIUM 9.2 06/16/2017   ANIONGAP 12 12/24/2013   No results found for: CHOL No results found for: HDL No results found for: LDLCALC No results found for: TRIG No results found for: CHOLHDL No results found for: [id number]    Assessment & Plan:  1. History of gestational diabetes Will check BMP and Hgb A1c in follow-up of patient's history of gestational diabetes and she will be notified of the results and if further intervention is needed based on the results. Educational material on preventing type 2 diabetes given as part of after visit summary. Low carb diet and exercise discussed in office.  - Basic Metabolic Panel - Hemoglobin A1c  2. Vaginal discharge She reports that she believes that her discharge might be related to a yeast infection however based on the appearance and odor of her discharge, I suspect that she may have bacterial vaginitis. While cervicovaginal ancillary results are pending, RX's will be sent to her pharmacy for diflucan in case of yeast infection and metronidazole in case of bacterial vaginitis. She will be notified if any further treatment is needed based on her results. - fluconazole (DIFLUCAN) 150 MG tablet; One pill x 1 then repeat in 3 days  Dispense: 2 tablet; Refill: 1 - metroNIDAZOLE (FLAGYL) 500 MG tablet; Take 1 tablet (500 mg total) by mouth 2 (two) times daily.  Dispense: 14 tablet; Refill: 0 - Cervicovaginal ancillary only  An After Visit Summary was printed and given to the  patient.  Follow-up: Return for 2-3 weeks if not better.   Cain Saupeammie Monifa Blanchette, MD

## 2019-11-10 NOTE — Patient Instructions (Signed)

## 2019-11-11 LAB — BASIC METABOLIC PANEL WITH GFR
BUN/Creatinine Ratio: 22 (ref 9–23)
BUN: 15 mg/dL (ref 6–20)
CO2: 25 mmol/L (ref 20–29)
Calcium: 9.2 mg/dL (ref 8.7–10.2)
Chloride: 102 mmol/L (ref 96–106)
Creatinine, Ser: 0.69 mg/dL (ref 0.57–1.00)
GFR calc Af Amer: 130 mL/min/1.73
GFR calc non Af Amer: 113 mL/min/1.73
Glucose: 101 mg/dL — ABNORMAL HIGH (ref 65–99)
Potassium: 4.2 mmol/L (ref 3.5–5.2)
Sodium: 139 mmol/L (ref 134–144)

## 2019-11-11 LAB — HEMOGLOBIN A1C
Est. average glucose Bld gHb Est-mCnc: 123 mg/dL
Hgb A1c MFr Bld: 5.9 % — ABNORMAL HIGH (ref 4.8–5.6)

## 2019-11-13 ENCOUNTER — Telehealth: Payer: Self-pay | Admitting: *Deleted

## 2019-11-13 LAB — CERVICOVAGINAL ANCILLARY ONLY
Bacterial Vaginitis (gardnerella): NEGATIVE
Candida Glabrata: NEGATIVE
Candida Vaginitis: POSITIVE — AB
Chlamydia: NEGATIVE
Comment: NEGATIVE
Comment: NEGATIVE
Comment: NEGATIVE
Comment: NEGATIVE
Comment: NEGATIVE
Comment: NORMAL
Neisseria Gonorrhea: NEGATIVE
Trichomonas: NEGATIVE

## 2019-11-13 NOTE — Telephone Encounter (Signed)
Medical Assistant left message on patient's home and cell voicemail. Voicemail states to give a call back to [place] with [facility] at [phone]. Patient is aware of being Pre Diabetic and offered the option to speak with a nutritionist.

## 2019-11-13 NOTE — Telephone Encounter (Signed)
-----   Message from Antony Blackbird, MD sent at 11/13/2019 12:09 AM EST ----- Basic metabolic panel with glucose of 101 which is normal if patient had eaten prior to visit, otherwise just above normal of less than 100.  Otherwise normal basic metabolic panel/electrolytes.  Hemoglobin A1c which tells help blood sugars have been over the last 90 days was elevated at 5.9.  Please offer referral to nutritionist/diabetic education.

## 2019-11-14 ENCOUNTER — Other Ambulatory Visit: Payer: Self-pay | Admitting: Nurse Practitioner

## 2019-11-14 ENCOUNTER — Telehealth: Payer: Self-pay | Admitting: Family Medicine

## 2019-11-14 NOTE — Telephone Encounter (Signed)
Patient was returning your call

## 2019-11-15 NOTE — Telephone Encounter (Signed)
Patient is picking up medication for BV/ yeast

## 2020-06-20 ENCOUNTER — Ambulatory Visit: Payer: Medicare Other | Admitting: Physician Assistant

## 2020-07-10 ENCOUNTER — Ambulatory Visit (HOSPITAL_BASED_OUTPATIENT_CLINIC_OR_DEPARTMENT_OTHER): Payer: Medicare Other | Admitting: Physician Assistant

## 2020-07-10 ENCOUNTER — Other Ambulatory Visit (HOSPITAL_COMMUNITY)
Admission: RE | Admit: 2020-07-10 | Discharge: 2020-07-10 | Disposition: A | Discharge status: Home / Self Care | Payer: Medicare Other | Source: Ambulatory Visit | Attending: Physician Assistant | Admitting: Physician Assistant

## 2020-07-10 ENCOUNTER — Encounter: Payer: Self-pay | Admitting: Physician Assistant

## 2020-07-10 ENCOUNTER — Other Ambulatory Visit: Payer: Self-pay

## 2020-07-10 VITALS — BP 129/83 | HR 75 | Temp 98.0°F | Resp 16 | Ht 60.0 in | Wt 177.0 lb

## 2020-07-10 DIAGNOSIS — R739 Hyperglycemia, unspecified: Secondary | ICD-10-CM | POA: Diagnosis not present

## 2020-07-10 DIAGNOSIS — Z113 Encounter for screening for infections with a predominantly sexual mode of transmission: Secondary | ICD-10-CM | POA: Diagnosis not present

## 2020-07-10 DIAGNOSIS — N898 Other specified noninflammatory disorders of vagina: Secondary | ICD-10-CM | POA: Diagnosis not present

## 2020-07-10 DIAGNOSIS — Z8619 Personal history of other infectious and parasitic diseases: Secondary | ICD-10-CM | POA: Insufficient documentation

## 2020-07-10 NOTE — Progress Notes (Signed)
Here for STD check  Itchy whitish  vaginal discharge X1wk

## 2020-07-10 NOTE — Progress Notes (Signed)
Patient ID: Sylvia Thompson, female   DOB: 1986/04/08, 34 y.o.   MRN: [id number]   Sylvia Thompson, is a 34 y.o. female  [id number]:[id number]  [id number]  DOB - [birth year]  Subjective:  Chief Complaint and HPI: Sylvia Thompson is a 34 y.o. female here today and wants to do STD testing.  Vaginal discharge for a few weeks that has gotten a little worse.  No fever.  No pelvic pain/abdominal pain.  No itching or burning.  No odor.    ROS:   Constitutional:  No f/c, No night sweats, No unexplained weight loss. EENT:  No vision changes, No blurry vision, No hearing changes. No mouth, throat, or ear problems.  Respiratory: No cough, No SOB Cardiac: No CP, no palpitations GI:  No abd pain, No N/V/D. GU: No Urinary s/sx Musculoskeletal: No joint pain Neuro: No headache, no dizziness, no motor weakness.  Skin: No rash Endocrine:  No polydipsia. No polyuria.  Psych: Denies SI/HI  No problems updated.  ALLERGIES: Allergies  Allergen Reactions  . Timolol Shortness Of Breath  . Latex Rash    PAST MEDICAL HISTORY: Past Medical History:  Diagnosis Date  . Anemia   . Blindness, legal   . Cataract   . Gestational diabetes    2007  . Glaucoma   . HPV (human papilloma virus) infection   . Prosthetic eye globe     MEDICATIONS AT HOME: Prior to Admission medications   Medication Sig Start Date End Date Taking? Authorizing Provider  albuterol (PROAIR HFA) 108 (90 Base) MCG/ACT inhaler Inhale 2 puffs into the lungs every 6 (six) hours as needed for wheezing or shortness of breath. Patient not taking: Reported on 07/10/2020 03/10/19   Constant, Peggy, MD  brimonidine (ALPHAGAN P) 0.1 % SOLN Place 1 drop into the left eye 2 (two) times daily. Patient not taking: Reported on 07/10/2020    [provider]  Brinzolamide-Brimonidine [facility] OP) Place 1 drop into the left eye at bedtime. Patient not taking: Reported on 07/10/2020    [provider]  dorzolamide (TRUSOPT) 2 %  ophthalmic solution Place 1 drop into both eyes 2 (two) times daily. Patient not taking: Reported on 07/10/2020    [provider]  ferrous sulfate 325 (65 FE) MG tablet Take 1 tablet (325 mg total) by mouth 2 (two) times daily with a meal. Patient not taking: Reported on 06/05/2019 02/09/19   Brock Bad, MD  fluconazole (DIFLUCAN) 150 MG tablet One pill x 1 then repeat in 3 days Patient not taking: Reported on 07/10/2020 11/10/19   Fulp, Hewitt Shorts, MD  ibuprofen (ADVIL) 800 MG tablet Take 1 tablet (800 mg total) by mouth every 8 (eight) hours as needed. Patient not taking: Reported on 11/10/2019 05/06/19   Armando Reichert, CNM  metroNIDAZOLE (FLAGYL) 500 MG tablet Take 1 tablet (500 mg total) by mouth 2 (two) times daily. Patient not taking: Reported on 07/10/2020 11/10/19   Fulp, Hewitt Shorts, MD  norethindrone (MICRONOR) 0.35 MG tablet Take 1 tablet (0.35 mg total) by mouth daily. Patient not taking: Reported on 11/10/2019 06/19/19   Adam Phenix, MD  Prenatal Vit-Fe Fumarate-FA (PRENATAL MULTIVITAMIN) TABS tablet Take 1 tablet by mouth daily at 12 noon. Patient not taking: Reported on 07/10/2020    [provider]     Objective:  EXAM:   Vitals:   07/10/20 0951  BP: 129/83  Pulse: 75  Resp: 16  Temp: 98 F (36.7 C)  SpO2: 100%  Weight: 177  lb (80.3 kg)  Height: 5' (1.524 m)    General appearance : A&OX3. NAD. Non-toxic-appearing HEENT: Atraumatic and Normocephalic.  PERRLA. EOM intact.   Chest/Lungs:  Breathing-non-labored, Good air entry bilaterally, breath sounds normal without rales, rhonchi, or wheezing  CVS: S1 S2 regular, no murmurs, gallops, rubs  GU:  External genitalia WNL;  Speculum inserted.  Some clear to yeloowish d/c in vault.  Cervix appears normal.  Swabs taken.  Bimanual unremarkable, neg chandelier sign Extremities: Bilateral Lower Ext shows no edema, both legs are warm to touch with = pulse throughout Neurology:  CN II-XII grossly intact, Non  focal.   Psych:  TP linear. J/I fair. Normal speech. Appropriate eye contact and affect.  Skin:  No Rash  Data Review Lab Results  Component Value Date   HGBA1C 5.9 (H) 11/10/2019     Assessment & Plan   1. Vaginal discharge - Cervicovaginal ancillary only - HIV antibody (with reflex) -will send meds to [facility] if needed  2. H/O trichomonal vaginitis STD prevention discussed.  Not using BC and not interested.  Condoms advised always - Cervicovaginal ancillary only - HIV antibody (with reflex) - RPR  3. Screening examination for STD (sexually transmitted disease) - Cervicovaginal ancillary only - HIV antibody (with reflex) - RPR  4. Hyperglycemia I have had a lengthy discussion and provided education about insulin resistance and the intake of too much sugar/refined carbohydrates.  I have advised the patient to work at a goal of eliminating sugary drinks, candy, desserts, sweets, refined sugars, processed foods, and white carbohydrates.  The patient expresses understanding.  - Hemoglobin A1c   Patient have been counseled extensively about nutrition and exercise  Return in about 4 months (around 11/10/2020) for PCP.  The patient was given clear instructions to go to ER or return to medical center if symptoms don't improve, worsen or new problems develop. The patient verbalized understanding. The patient was told to call to get lab results if they haven't heard anything in the next week.     Georgian Co, PA-C [facility] and [facility] [place], [place] [phone]   07/10/2020, 11:15 AM

## 2020-07-10 NOTE — Patient Instructions (Signed)

## 2020-07-11 ENCOUNTER — Other Ambulatory Visit: Payer: Self-pay | Admitting: Physician Assistant

## 2020-07-11 DIAGNOSIS — N898 Other specified noninflammatory disorders of vagina: Secondary | ICD-10-CM

## 2020-07-11 LAB — CERVICOVAGINAL ANCILLARY ONLY
Bacterial Vaginitis (gardnerella): NEGATIVE
Candida Glabrata: NEGATIVE
Candida Vaginitis: POSITIVE — AB
Chlamydia: NEGATIVE
Comment: NEGATIVE
Comment: NEGATIVE
Comment: NEGATIVE
Comment: NEGATIVE
Comment: NEGATIVE
Comment: NORMAL
Neisseria Gonorrhea: NEGATIVE
Trichomonas: NEGATIVE

## 2020-07-11 LAB — HIV ANTIBODY (ROUTINE TESTING W REFLEX): HIV Screen 4th Generation wRfx: NONREACTIVE

## 2020-07-11 LAB — HEMOGLOBIN A1C
Est. average glucose Bld gHb Est-mCnc: 123 mg/dL
Hgb A1c MFr Bld: 5.9 % — ABNORMAL HIGH (ref 4.8–5.6)

## 2020-07-11 LAB — RPR: RPR Ser Ql: NONREACTIVE

## 2020-07-11 MED ORDER — FLUCONAZOLE 150 MG PO TABS: ORAL_TABLET | ORAL | 1 refills | Status: DC

## 2020-10-06 ENCOUNTER — Encounter (HOSPITAL_COMMUNITY): Payer: Self-pay | Admitting: Emergency Medicine

## 2020-10-06 ENCOUNTER — Other Ambulatory Visit: Payer: Self-pay

## 2020-10-06 ENCOUNTER — Ambulatory Visit (HOSPITAL_COMMUNITY)
Admission: EM | Admit: 2020-10-06 | Discharge: 2020-10-06 | Disposition: A | Discharge status: Home / Self Care | Payer: Medicare Other | Attending: Physician Assistant | Admitting: Physician Assistant

## 2020-10-06 DIAGNOSIS — R0789 Other chest pain: Secondary | ICD-10-CM | POA: Diagnosis not present

## 2020-10-06 MED ORDER — IBUPROFEN 600 MG PO TABS: 600.0000 mg | ORAL_TABLET | Freq: Four times a day (QID) | ORAL | 0 refills | Status: DC | PRN

## 2020-10-06 NOTE — ED Provider Notes (Signed)
[facility]    CSN: [id number] Arrival date & time: 10/06/20  1003      History   Chief Complaint Chief Complaint  Patient presents with  . Chest Pain    HPI Sylvia Thompson is a 34 y.o. female.   Patient with PMH asthma, here c/w chest pain x 2 hours.  Pain located substernal and radiates to back.  Worsened by movement of upper body and deep inspiration (despite nursing note stating contrary).  Nothing improves pain.  When asked to characterize pain and given examples of sharp, dull, achy, or stabbing pain, patient replied, "all of them."  Denies history of HTN, HLD, and smoking, states she's prediabetic (A1c 3 months ago 5.9).  She has not tried anything for this pain.  She has not need to use her inhaler in over 1 year.     Past Medical History:  Diagnosis Date  . Anemia   . Blindness, legal   . Cataract   . Gestational diabetes    2007  . Glaucoma   . HPV (human papilloma virus) infection   . Prosthetic eye globe     Patient Active Problem List   Diagnosis Date Noted  . Labor and delivery indication for care or intervention 05/04/2019  . NSVD (normal spontaneous vaginal delivery) 05/04/2019  . GBS (group B Streptococcus carrier), +RV culture, currently pregnant 04/12/2019  . Chlamydia 01/03/2019  . Marginal insertion of umbilical cord affecting management of mother 12/12/2018  . Trichomonal vaginitis in pregnancy, first trimester 11/02/2018  . History of preterm delivery- IOL for oligo  11/02/2018  . History of gestational diabetes 11/02/2018  . Supervision of other normal pregnancy, antepartum 10/19/2018  . Nausea and vomiting during pregnancy prior to [redacted] weeks gestation 10/19/2018  . 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 Blindness, legal     Past Surgical History:  Procedure Laterality Date  . DILATION AND CURETTAGE OF UTERUS    . EYE SURGERY     prosthetic right eye    OB History    Gravida  6   Para  4   Term  3   Preterm  1   AB  2    Living  4     SAB  2   TAB      Ectopic      Multiple  0   Live Births  4            Home Medications    Prior to Admission medications   Medication Sig Start Date End Date Taking? Authorizing Provider  albuterol (PROAIR HFA) 108 (90 Base) MCG/ACT inhaler Inhale 2 puffs into the lungs every 6 (six) hours as needed for wheezing or shortness of breath. Patient not taking: Reported on 07/10/2020 03/10/19   Constant, Peggy, MD  brimonidine (ALPHAGAN P) 0.1 % SOLN Place 1 drop into the left eye 2 (two) times daily. Patient not taking: Reported on 07/10/2020    [provider]  Brinzolamide-Brimonidine [facility] OP) Place 1 drop into the left eye at bedtime. Patient not taking: Reported on 07/10/2020    [provider]  dorzolamide (TRUSOPT) 2 % ophthalmic solution Place 1 drop into both eyes 2 (two) times daily. Patient not taking: Reported on 07/10/2020    [provider]  ferrous sulfate 325 (65 FE) MG tablet Take 1 tablet (325 mg total) by mouth 2 (two) times daily with a meal. Patient not taking: Reported on 06/05/2019 02/09/19   Brock Bad,  MD  fluconazole (DIFLUCAN) 150 MG tablet One pill x 1 then repeat in 3 days 07/11/20   Anders Simmonds, PA-C  ibuprofen (ADVIL) 600 MG tablet Take 1 tablet (600 mg total) by mouth every 6 (six) hours as needed. 10/06/20   Evern Core, PA-C  ibuprofen (ADVIL) 800 MG tablet Take 1 tablet (800 mg total) by mouth every 8 (eight) hours as needed. Patient not taking: Reported on 11/10/2019 05/06/19   Armando Reichert, CNM  metroNIDAZOLE (FLAGYL) 500 MG tablet Take 1 tablet (500 mg total) by mouth 2 (two) times daily. Patient not taking: Reported on 07/10/2020 11/10/19   Fulp, Hewitt Shorts, MD  norethindrone (MICRONOR) 0.35 MG tablet Take 1 tablet (0.35 mg total) by mouth daily. Patient not taking: Reported on 11/10/2019 06/19/19   Adam Phenix, MD  Prenatal Vit-Fe Fumarate-FA (PRENATAL MULTIVITAMIN) TABS tablet Take 1  tablet by mouth daily at 12 noon. Patient not taking: Reported on 07/10/2020    [provider]    Family History Family History  Problem Relation Age of Onset  . Diabetes Mother   . Arthritis Mother     Social History Social History   Tobacco Use  . Smoking status: Never Smoker  . Smokeless tobacco: Never Used  Vaping Use  . Vaping Use: Never used  Substance Use Topics  . Alcohol use: Not Currently  . Drug use: No     Allergies   Timolol and Latex   Review of Systems Review of Systems  Constitutional: Negative for chills, diaphoresis, fatigue and fever.  Eyes: Negative for pain and visual disturbance.  Respiratory: Negative for cough and shortness of breath.   Cardiovascular: Positive for chest pain. Negative for palpitations and leg swelling.  Gastrointestinal: Negative for abdominal pain and vomiting.  Musculoskeletal: Positive for back pain. Negative for arthralgias and myalgias.  Skin: Negative for color change and rash.  Neurological: Negative for dizziness, light-headedness and headaches.  Hematological: Negative for adenopathy. Does not bruise/bleed easily.  Psychiatric/Behavioral: Negative for confusion.  All other systems reviewed and are negative.    Physical Exam Triage Vital Signs ED Triage Vitals [10/06/20 1020]  Enc Vitals Group     BP 123/72     Pulse Rate 75     Resp 17     Temp 98.8 F (37.1 C)     Temp Source Oral     SpO2 100 %     Weight      Height      Head Circumference      Peak Flow      Pain Score 7     Pain Loc      Pain Edu?      Excl. in GC?    No data found.  Updated Vital Signs BP 123/72 (BP Location: Left Arm)   Pulse 75   Temp 98.8 F (37.1 C) (Oral)   Resp 17   LMP 09/26/2020   SpO2 100%   Visual Acuity Right Eye Distance:   Left Eye Distance:   Bilateral Distance:    Right Eye Near:   Left Eye Near:    Bilateral Near:     Physical Exam Vitals and nursing note reviewed.  Constitutional:        General: She is not in acute distress.    Appearance: She is well-developed.  HENT:     Head: Normocephalic and atraumatic.  Eyes:     Conjunctiva/sclera: Conjunctivae normal.  Cardiovascular:     Rate and  Rhythm: Normal rate and regular rhythm.  No extrasystoles are present.    Heart sounds: No murmur heard.   Pulmonary:     Effort: Pulmonary effort is normal. No respiratory distress.     Breath sounds: Normal breath sounds. No decreased breath sounds or wheezing.  Chest:     Chest wall: No tenderness.  Abdominal:     Palpations: Abdomen is soft.     Tenderness: There is no abdominal tenderness.  Musculoskeletal:        General: Normal range of motion.     Cervical back: Neck supple.     Thoracic back: Tenderness present. Normal range of motion. No scoliosis.       Back:     Right lower leg: No edema.     Left lower leg: No edema.  Skin:    General: Skin is warm and dry.     Capillary Refill: Capillary refill takes less than 2 seconds.  Neurological:     General: No focal deficit present.     Mental Status: She is alert. She is disoriented.     Cranial Nerves: No cranial nerve deficit.     Motor: No weakness.  Psychiatric:        Mood and Affect: Mood normal.        Behavior: Behavior normal.      UC Treatments / Results  Labs (all labs ordered are listed, but only abnormal results are displayed) Labs Reviewed - No data to display  EKG EKG: normal EKG, normal sinus rhythm, unchanged from previous tracings.   Radiology No results found.  Procedures Procedures (including critical care time)  Medications Ordered in UC Medications - No data to display  Initial Impression / Assessment and Plan / UC Course  I have reviewed the triage vital signs and the nursing notes.  Pertinent labs & imaging results that were available during my care of the patient were reviewed by me and considered in my medical decision making (see chart for details).     Strict  ED precautions provided EKG reassuring, NSR and unchanged from previous  Low risk of cardiac event Final Clinical Impressions(s) / UC Diagnoses   Final diagnoses:  Atypical chest pain     Discharge Instructions     Go to Emergency Department if pain worsens or does not improve as expected.  Follow up with PCP as soon as possible.      ED Prescriptions    Medication Sig Dispense Auth. Provider   ibuprofen (ADVIL) 600 MG tablet Take 1 tablet (600 mg total) by mouth every 6 (six) hours as needed. 30 tablet Evern Core, PA-C     PDMP not reviewed this encounter.   Evern Core, PA-C 10/06/20 1109

## 2020-10-06 NOTE — ED Triage Notes (Signed)
Pt presents with chest pain xs 2 hours. States is an aching pain. States hurts with moving forward or laying back. Does not hurt when coughing or breathing.

## 2020-10-06 NOTE — Discharge Instructions (Signed)
Go to Emergency Department if pain worsens or does not improve as expected.  Follow up with PCP as soon as possible.

## 2021-02-28 ENCOUNTER — Other Ambulatory Visit: Payer: Self-pay

## 2021-02-28 ENCOUNTER — Encounter (HOSPITAL_COMMUNITY): Payer: Self-pay

## 2021-02-28 ENCOUNTER — Ambulatory Visit (HOSPITAL_COMMUNITY)
Admission: EM | Admit: 2021-02-28 | Discharge: 2021-02-28 | Disposition: A | Discharge status: Home / Self Care | Payer: Medicare Other | Attending: Family Medicine | Admitting: Family Medicine

## 2021-02-28 DIAGNOSIS — K047 Periapical abscess without sinus: Secondary | ICD-10-CM

## 2021-02-28 DIAGNOSIS — R220 Localized swelling, mass and lump, head: Secondary | ICD-10-CM | POA: Diagnosis not present

## 2021-02-28 MED ORDER — LIDOCAINE VISCOUS HCL 2 % MT SOLN: 10.0000 mL | OROMUCOSAL | 0 refills | Status: DC | PRN

## 2021-02-28 MED ORDER — AMOXICILLIN-POT CLAVULANATE 875-125 MG PO TABS: 1.0000 | ORAL_TABLET | Freq: Two times a day (BID) | ORAL | 0 refills | Status: DC

## 2021-02-28 NOTE — ED Provider Notes (Signed)
[facility]    CSN: [id number] Arrival date & time: 02/28/21  1501      History   Chief Complaint Chief Complaint  Patient presents with  . Facial Swelling  . Facial Pain    HPI Sylvia Thompson is a 35 y.o. female.   Patient presenting today with several day history of pain at the gumline left upper jaw for the past few days and then this morning noticed facial swelling in this area and worsening pain and swelling.  She states it feels like the area is trying to start draining.  She denies history of dental issues, fever, chills, sweats, headache, ear pain, throat pain.  Has tried Orajel and Tylenol with minimal relief.  Been trying to make a dentist appointment all day but so far unable to find anyone.     Past Medical History:  Diagnosis Date  . Anemia   . Blindness, legal   . Cataract   . Gestational diabetes    2007  . Glaucoma   . HPV (human papilloma virus) infection   . Prosthetic eye globe     Patient Active Problem List   Diagnosis Date Noted  . Labor and delivery indication for care or intervention 05/04/2019  . NSVD (normal spontaneous vaginal delivery) 05/04/2019  . GBS (group B Streptococcus carrier), +RV culture, currently pregnant 04/12/2019  . Chlamydia 01/03/2019  . Marginal insertion of umbilical cord affecting management of mother 12/12/2018  . Trichomonal vaginitis in pregnancy, first trimester 11/02/2018  . History of preterm delivery- IOL for oligo  11/02/2018  . History of gestational diabetes 11/02/2018  . Supervision of other normal pregnancy, antepartum 10/19/2018  . Nausea and vomiting during pregnancy prior to [redacted] weeks gestation 10/19/2018  . 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 Blindness, legal     Past Surgical History:  Procedure Laterality Date  . DILATION AND CURETTAGE OF UTERUS    . EYE SURGERY     prosthetic right eye    OB History    Gravida  6   Para  4   Term  3   Preterm  1   AB  2   Living  4      SAB  2   IAB      Ectopic      Multiple  0   Live Births  4            Home Medications    Prior to Admission medications   Medication Sig Start Date End Date Taking? Authorizing Provider  amoxicillin-clavulanate (AUGMENTIN) 875-125 MG tablet Take 1 tablet by mouth every 12 (twelve) hours. 02/28/21  Yes Particia Nearing, PA-C  lidocaine (XYLOCAINE) 2 % solution Use as directed 10 mLs in the mouth or throat as needed for mouth pain. 02/28/21  Yes Particia Nearing, PA-C  albuterol [facility]) 108 [phone] Base) MCG/ACT inhaler Inhale 2 puffs into the lungs every 6 (six) hours as needed for wheezing or shortness of breath. Patient not taking: Reported on 07/10/2020 03/10/19   Constant, Peggy, MD  brimonidine (ALPHAGAN P) 0.1 % SOLN Place 1 drop into the left eye 2 (two) times daily. Patient not taking: Reported on 07/10/2020    [provider]  Brinzolamide-Brimonidine [facility] OP) Place 1 drop into the left eye at bedtime. Patient not taking: Reported on 07/10/2020    [provider]  dorzolamide (TRUSOPT) 2 % ophthalmic solution Place 1 drop into both eyes 2 (two) times daily. Patient  not taking: Reported on 07/10/2020    [provider]  ferrous sulfate 325 (65 FE) MG tablet Take 1 tablet (325 mg total) by mouth 2 (two) times daily with a meal. Patient not taking: Reported on 06/05/2019 02/09/19   Brock Bad, MD  fluconazole (DIFLUCAN) 150 MG tablet One pill x 1 then repeat in 3 days 07/11/20   Anders Simmonds, PA-C  ibuprofen (ADVIL) 600 MG tablet Take 1 tablet (600 mg total) by mouth every 6 (six) hours as needed. 10/06/20   Evern Core, PA-C  ibuprofen (ADVIL) 800 MG tablet Take 1 tablet (800 mg total) by mouth every 8 (eight) hours as needed. Patient not taking: Reported on 11/10/2019 05/06/19   Armando Reichert, CNM  metroNIDAZOLE (FLAGYL) 500 MG tablet Take 1 tablet (500 mg total) by mouth 2 (two) times daily. Patient not taking:  Reported on 07/10/2020 11/10/19   Fulp, Hewitt Shorts, MD  norethindrone (MICRONOR) 0.35 MG tablet Take 1 tablet (0.35 mg total) by mouth daily. Patient not taking: Reported on 11/10/2019 06/19/19   Adam Phenix, MD  Prenatal Vit-Fe Fumarate-FA (PRENATAL MULTIVITAMIN) TABS tablet Take 1 tablet by mouth daily at 12 noon. Patient not taking: Reported on 07/10/2020    [provider]    Family History Family History  Problem Relation Age of Onset  . Diabetes Mother   . Arthritis Mother     Social History Social History   Tobacco Use  . Smoking status: Never Smoker  . Smokeless tobacco: Never Used  Vaping Use  . Vaping Use: Never used  Substance Use Topics  . Alcohol use: Not Currently  . Drug use: No     Allergies   Timolol and Latex   Review of Systems Review of Systems Per HPI  Physical Exam Triage Vital Signs ED Triage Vitals  Enc Vitals Group     BP 02/28/21 1517 132/64     Pulse Rate 02/28/21 1517 85     Resp 02/28/21 1517 17     Temp 02/28/21 1517 98.3 F (36.8 C)     Temp Source 02/28/21 1517 Oral     SpO2 02/28/21 1517 99 %     Weight --      Height --      Head Circumference --      Peak Flow --      Pain Score 02/28/21 1515 9     Pain Loc --      Pain Edu? --      Excl. in GC? --    No data found.  Updated Vital Signs BP 132/64 (BP Location: Left Arm)   Pulse 85   Temp 98.3 F (36.8 C) (Oral)   Resp 17   LMP 02/17/2021   SpO2 99%   Visual Acuity Right Eye Distance:   Left Eye Distance:   Bilateral Distance:    Right Eye Near:   Left Eye Near:    Bilateral Near:     Physical Exam Vitals and nursing note reviewed.  Constitutional:      Appearance: Normal appearance. She is not ill-appearing.  HENT:     Head: Atraumatic.     Right Ear: Tympanic membrane normal.     Left Ear: Tympanic membrane normal.     Nose: Nose normal.     Mouth/Throat:     Mouth: Mucous membranes are moist.     Comments: Area of gingival erythema and  edema left upper jaw above first molar, no active  drainage from the area.  Fairly good dentition diffusely Eyes:     Extraocular Movements: Extraocular movements intact.     Conjunctiva/sclera: Conjunctivae normal.  Cardiovascular:     Rate and Rhythm: Normal rate and regular rhythm.     Heart sounds: Normal heart sounds.  Pulmonary:     Effort: Pulmonary effort is normal.     Breath sounds: Normal breath sounds.  Abdominal:     General: Bowel sounds are normal. There is no distension.     Palpations: Abdomen is soft.     Tenderness: There is no abdominal tenderness. There is no guarding.  Musculoskeletal:        General: Normal range of motion.     Cervical back: Normal range of motion and neck supple.  Lymphadenopathy:     Cervical: No cervical adenopathy.  Skin:    General: Skin is warm and dry.  Neurological:     Mental Status: She is alert and oriented to person, place, and time.  Psychiatric:        Mood and Affect: Mood normal.        Thought Content: Thought content normal.        Judgment: Judgment normal.      UC Treatments / Results  Labs (all labs ordered are listed, but only abnormal results are displayed) Labs Reviewed - No data to display  EKG   Radiology No results found.  Procedures Procedures (including critical care time)  Medications Ordered in UC Medications - No data to display  Initial Impression / Assessment and Plan / UC Course  I have reviewed the triage vital signs and the nursing notes.  Pertinent labs & imaging results that were available during my care of the patient were reviewed by me and considered in my medical decision making (see chart for details).     Suspect swelling from a dental infection.  Will treat with Augmentin and viscous lidocaine.  Over-the-counter pain relievers, salt water gargles as needed.  Close dental follow-up as soon as able.  Final Clinical Impressions(s) / UC Diagnoses   Final diagnoses:  Dental  infection  Facial swelling   Discharge Instructions   None    ED Prescriptions    Medication Sig Dispense Auth. Provider   amoxicillin-clavulanate (AUGMENTIN) 875-125 MG tablet Take 1 tablet by mouth every 12 (twelve) hours. 14 tablet Particia Nearing, [place]   lidocaine (XYLOCAINE) 2 % solution Use as directed 10 mLs in the mouth or throat as needed for mouth pain. 100 mL Particia Nearing, [place]     PDMP not reviewed this encounter.   Particia Nearing, [place] 02/28/21 1545

## 2021-02-28 NOTE — ED Triage Notes (Signed)
Pt presents with left side facial pain & swelling since waking up this morning.

## 2021-04-02 ENCOUNTER — Other Ambulatory Visit: Payer: Self-pay

## 2021-04-02 ENCOUNTER — Ambulatory Visit: Payer: Medicare Other | Admitting: Physician Assistant

## 2021-04-02 NOTE — Progress Notes (Deleted)
Patient ID: Sylvia Thompson, female   DOB: 02-May-1986, 35 y.o.   MRN: [id number]   After being seen in the ED for tooth pain 02/28/2021  From A/P: Suspect swelling from a dental infection.  Will treat with Augmentin and viscous lidocaine.  Over-the-counter pain relievers, salt water gargles as needed.  Close dental follow-up as soon as able.

## 2021-05-20 ENCOUNTER — Encounter: Payer: Self-pay | Admitting: Family Medicine

## 2021-05-20 ENCOUNTER — Ambulatory Visit: Payer: Medicare Other | Attending: Family Medicine | Admitting: Family Medicine

## 2021-05-20 ENCOUNTER — Other Ambulatory Visit: Payer: Self-pay

## 2021-05-20 ENCOUNTER — Other Ambulatory Visit (HOSPITAL_COMMUNITY)
Admission: RE | Admit: 2021-05-20 | Discharge: 2021-05-20 | Disposition: A | Discharge status: Home / Self Care | Payer: Medicare Other | Source: Ambulatory Visit | Attending: Family Medicine | Admitting: Family Medicine

## 2021-05-20 VITALS — BP 106/68 | HR 80 | Ht 60.0 in | Wt 173.0 lb

## 2021-05-20 DIAGNOSIS — J45909 Unspecified asthma, uncomplicated: Secondary | ICD-10-CM | POA: Insufficient documentation

## 2021-05-20 DIAGNOSIS — Z760 Encounter for issue of repeat prescription: Secondary | ICD-10-CM | POA: Insufficient documentation

## 2021-05-20 DIAGNOSIS — R300 Dysuria: Secondary | ICD-10-CM | POA: Insufficient documentation

## 2021-05-20 DIAGNOSIS — J4520 Mild intermittent asthma, uncomplicated: Secondary | ICD-10-CM

## 2021-05-20 DIAGNOSIS — N3000 Acute cystitis without hematuria: Secondary | ICD-10-CM

## 2021-05-20 DIAGNOSIS — R61 Generalized hyperhidrosis: Secondary | ICD-10-CM | POA: Insufficient documentation

## 2021-05-20 DIAGNOSIS — Z1159 Encounter for screening for other viral diseases: Secondary | ICD-10-CM | POA: Diagnosis not present

## 2021-05-20 DIAGNOSIS — N898 Other specified noninflammatory disorders of vagina: Secondary | ICD-10-CM | POA: Diagnosis not present

## 2021-05-20 DIAGNOSIS — R7303 Prediabetes: Secondary | ICD-10-CM | POA: Diagnosis not present

## 2021-05-20 DIAGNOSIS — Z0000 Encounter for general adult medical examination without abnormal findings: Secondary | ICD-10-CM | POA: Diagnosis not present

## 2021-05-20 LAB — POCT URINALYSIS DIP (CLINITEK)
Bilirubin, UA: NEGATIVE
Glucose, UA: NEGATIVE mg/dL
Ketones, POC UA: NEGATIVE mg/dL
Nitrite, UA: POSITIVE — AB
Spec Grav, UA: 1.025 (ref 1.010–1.025)
Urobilinogen, UA: 0.2 E.U./dL
pH, UA: 6.5 (ref 5.0–8.0)

## 2021-05-20 MED ORDER — ALBUTEROL SULFATE HFA 108 (90 BASE) MCG/ACT IN AERS: 2.0000 | INHALATION_SPRAY | Freq: Four times a day (QID) | RESPIRATORY_TRACT | 3 refills | Status: DC | PRN

## 2021-05-20 MED ORDER — CEPHALEXIN 500 MG PO CAPS: 500.0000 mg | ORAL_CAPSULE | Freq: Two times a day (BID) | ORAL | 0 refills | Status: DC

## 2021-05-20 NOTE — Progress Notes (Signed)
Subjective:   Sylvia Thompson is a 35 y.o. female who presents for Medicare Annual (Subsequent) preventive examination.  She has a following concerns in addition. This last monthly cycle of hers a week ago revealed a darker red without passage of blood clots and has a concern. She has had vaginal itching and discharge 'sometimes clear at other times like yeast'. She also has had pelvic pressure on urination but no urinary frequency. For the last 2 years her vaginal symptoms have been present and this was probably after her baby. Also complains of night sweats after her baby.  She states she has the same sexual partner, does not use protection for sexual intercourse.  Last sexual intercourse was a couple of days ago. She is also requesting refill of her asthma inhaler. Review of Systems    General: negative for fever, weight loss, appetite change Eyes: Abnormal vision ENT: no ear symptoms, no sinus tenderness, no nasal congestion or sore throat. Neck: no pain  Respiratory: no wheezing, shortness of breath, cough Cardiovascular: no chest pain, no dyspnea on exertion, no pedal edema, no orthopnea. Gastrointestinal: no abdominal pain, no diarrhea, no constipation Genito-Urinary: See HPI Hematologic: no bruising Endocrine: no cold or heat intolerance Neurological: no headaches, no seizures, no tremors Musculoskeletal: no joint pains, no joint swelling Skin: no pruritus, no rash. Psychological: no depression, no anxiety,          Objective:    Today's Vitals   05/20/21 0955  BP: 106/68  Pulse: 80  SpO2: 100%  Weight: 173 lb (78.5 kg)  Height: 5' (1.524 m)   Body mass index is 33.79 kg/m.  Advanced Directives 05/20/2021 07/10/2020 05/04/2019 05/02/2019 04/22/2019 03/22/2019 03/17/2019  Does Patient Have a Medical Advance Directive? No No No No No No No  Would patient like information on creating a medical advance directive? - - No - Patient declined No - Patient declined No -  Guardian declined - No - Patient declined    Current Medications (verified) Outpatient Encounter Medications as of 05/20/2021  Medication Sig  . cephALEXin (KEFLEX) 500 MG capsule Take 1 capsule (500 mg total) by mouth 2 (two) times daily.  Marland Kitchen. albuterol (PROAIR HFA) 108 (90 Base) MCG/ACT inhaler Inhale 2 puffs into the lungs every 6 (six) hours as needed for wheezing or shortness of breath.  . brimonidine (ALPHAGAN P) 0.1 % SOLN Place 1 drop into the left eye 2 (two) times daily. (Patient not taking: No sig reported)  . Brinzolamide-Brimonidine (SIMBRINZA OP) Place 1 drop into the left eye at bedtime. (Patient not taking: No sig reported)  . dorzolamide (TRUSOPT) 2 % ophthalmic solution Place 1 drop into both eyes 2 (two) times daily. (Patient not taking: No sig reported)  . ferrous sulfate 325 (65 FE) MG tablet Take 1 tablet (325 mg total) by mouth 2 (two) times daily with a meal. (Patient not taking: No sig reported)  . fluconazole (DIFLUCAN) 150 MG tablet One pill x 1 then repeat in 3 days (Patient not taking: Reported on 05/20/2021)  . ibuprofen (ADVIL) 600 MG tablet Take 1 tablet (600 mg total) by mouth every 6 (six) hours as needed. (Patient not taking: Reported on 05/20/2021)  . ibuprofen (ADVIL) 800 MG tablet Take 1 tablet (800 mg total) by mouth every 8 (eight) hours as needed. (Patient not taking: No sig reported)  . lidocaine (XYLOCAINE) 2 % solution Use as directed 10 mLs in the mouth or throat as needed for mouth pain. (Patient not taking:  Reported on 05/20/2021)  . metroNIDAZOLE (FLAGYL) 500 MG tablet Take 1 tablet (500 mg total) by mouth 2 (two) times daily. (Patient not taking: No sig reported)  . norethindrone (MICRONOR) 0.35 MG tablet Take 1 tablet (0.35 mg total) by mouth daily. (Patient not taking: No sig reported)  . Prenatal Vit-Fe Fumarate-FA (PRENATAL MULTIVITAMIN) TABS tablet Take 1 tablet by mouth daily at 12 noon. (Patient not taking: No sig reported)  . [DISCONTINUED]  albuterol (PROAIR HFA) 108 (90 Base) MCG/ACT inhaler Inhale 2 puffs into the lungs every 6 (six) hours as needed for wheezing or shortness of breath. (Patient not taking: No sig reported)  . [DISCONTINUED] amoxicillin-clavulanate (AUGMENTIN) 875-125 MG tablet Take 1 tablet by mouth every 12 (twelve) hours. (Patient not taking: Reported on 05/20/2021)   No facility-administered encounter medications on file as of 05/20/2021.    Allergies (verified) Timolol and Latex   History: Past Medical History:  Diagnosis Date  . Anemia   . Blindness, legal   . Cataract   . Gestational diabetes    2007  . Glaucoma   . HPV (human papilloma virus) infection   . Prosthetic eye globe    Past Surgical History:  Procedure Laterality Date  . DILATION AND CURETTAGE OF UTERUS    . EYE SURGERY     prosthetic right eye   Family History  Problem Relation Age of Onset  . Diabetes Mother   . Arthritis Mother    Social History   Socioeconomic History  . Marital status: Married    Spouse name: Not on file  . Number of children: Not on file  . Years of education: Not on file  . Highest education level: Not on file  Occupational History  . Not on file  Tobacco Use  . Smoking status: Never Smoker  . Smokeless tobacco: Never Used  Vaping Use  . Vaping Use: Never used  Substance and Sexual Activity  . Alcohol use: Not Currently  . Drug use: No  . Sexual activity: Yes    Partners: Male    Birth control/protection: None    Comment: pregnant   Other Topics Concern  . Not on file  Social History Narrative  . Not on file   Social Determinants of Health   Financial Resource Strain: Not on file  Food Insecurity: Not on file  Transportation Needs: Not on file  Physical Activity: Not on file  Stress: Not on file  Social Connections: Not on file    Tobacco Counseling Counseling given: Not Answered   Clinical Intake:  Pre-visit preparation completed: No  Pain : No/denies pain      Diabetes: No     Interpreter Needed?: No      Activities of Daily Living In your present state of health, do you have any difficulty performing the following activities: 05/20/2021  Hearing? N  Vision? Y  Difficulty concentrating or making decisions? N  Walking or climbing stairs? N  Dressing or bathing? N  Doing errands, shopping? N  Preparing Food and eating ? N  Using the Toilet? N  In the past six months, have you accidently leaked urine? N  Do you have problems with loss of bowel control? N  Managing your Medications? N  Managing your Finances? N  Housekeeping or managing your Housekeeping? N  Some recent data might be hidden    Indicate any recent Medical Services you may have received from other than [facility] providers in the past year (date may be approximate).  Ophthalmology-[facility]  Physical exam Constitutional: normal appearing,  Eyes: PERRLA HEENT: Head is atraumatic, normal sinuses, normal oropharynx, normal appearing tonsils and palate, tympanic membrane is normal bilaterally. Neck: normal range of motion, no thyromegaly, no JVD Cardiovascular: normal rate and rhythm, normal heart sounds, no murmurs, rub or gallop, no pedal edema Respiratory: Normal breath sounds, clear to auscultation bilaterally, no wheezes, no rales, no rhonchi Abdomen: soft, not tender to palpation, normal bowel sounds, no enlarged organs Musculoskeletal: Full ROM, no tenderness in joints Skin: warm and dry, no lesions. Neurological: alert, oriented x3, cranial nerves I-XII grossly intact , normal motor strength, normal sensation. Psychological: normal mood.   Assessment:   This is a routine wellness examination for [facility].  Hearing/Vision screen  Hearing Screening   125Hz  250Hz  500Hz  1000Hz  2000Hz  3000Hz  4000Hz  6000Hz  8000Hz   Right ear:           Left ear:           Vision Screening Comments: Patient states that she is legally blind  Dietary issues and  exercise activities discussed:    Goals Addressed            This Visit's Progress   .  Acknowledge receipt of      . Exercise 150 min/wk Moderate Activity        Depression Screen PHQ 2/9 Scores 05/20/2021 07/10/2020 11/10/2019 06/16/2018 04/22/2018 03/10/2018 07/08/2017  PHQ - 2 Score 0 0 0 0 0 0 0  PHQ- 9 Score - 0 9 0 0 2 1    Fall Risk Fall Risk  05/20/2021 07/10/2020 11/10/2019 06/16/2018  Falls in the past year? 0 0 0 No  Number falls in past yr: 0 - - -  Injury with Fall? 0 - - -  Risk for fall due to : - - History of fall(s) -    FALL RISK PREVENTION PERTAINING TO THE HOME:  Any stairs in or around the home? No  If so, are there any without handrails? No  Home free of loose throw rugs in walkways, pet beds, electrical cords, etc? Yes  Adequate lighting in your home to reduce risk of falls? Yes   ASSISTIVE DEVICES UTILIZED TO PREVENT FALLS:  Life alert? No  Use of a cane, walker or w/c? No  Grab bars in the bathroom? No  Shower chair or bench in shower? No  Elevated toilet seat or a handicapped toilet? No   TIMED UP AND GO:  Was the test performed? Yes .  Length of time to ambulate 10 feet: 7 sec.   Gait steady and fast without use of assistive device  Cognitive Function:  Normal      Immunizations There is no immunization history for the selected administration types on file for this patient.  TDAP status: Up to date   Screening Tests Health Maintenance  Topic Date Due  . COVID-19 Vaccine (1) Never done  . Hepatitis C Screening  Never done  . TETANUS/TDAP  06/17/2027 (Originally 08/23/2003)  . INFLUENZA VACCINE  07/21/2021  . PAP SMEAR-Modifier  10/19/2021  . Zoster Vaccines- Shingrix (1 of 2) 08/22/2034  . HIV Screening  Completed  . HPV VACCINES  Aged Out    Health Maintenance  Health Maintenance Due  Topic Date Due  . COVID-19 Vaccine (1) Never done  . Hepatitis C Screening  Never done   Additional  Screening:  Hepatitis C Screening: does qualify; Completed today  Vision Screening: Recommended annual ophthalmology  exams for early detection of glaucoma and other disorders of the eye. Is the patient up to date with their annual eye exam?  Yes  Who is the provider or what is the name of the office in which the patient attends annual eye exams? [facility] If pt is not established with a provider, would they like to be referred to a provider to establish care? No .   Dental Screening: Recommended annual dental exams for proper oral hygiene  Community Resource Referral / Chronic Care Management: CRR required this visit?  No   CCM required this visit?  No      Plan:     [place] shots I have personally reviewed and noted the following in the patient's chart:   . Medical and social history . Use of alcohol, tobacco or illicit drugs  . Current medications and supplements including opioid prescriptions.  . Functional ability and status . Nutritional status . Physical activity . Advanced directives . List of other physicians . Hospitalizations, surgeries, and ER visits in previous 12 months . Vitals . Screenings to include cognitive, depression, and falls . Referrals and appointments  In addition, I have reviewed and discussed with patient certain preventive protocols, quality metrics, and best practice recommendations. A written personalized care plan for preventive services as well as general preventive health recommendations were provided to patient.     Hoy Register, MD   05/20/2021

## 2021-05-20 NOTE — Progress Notes (Signed)
Needs inhaler refilled. Vaginal itching.

## 2021-05-20 NOTE — Patient Instructions (Signed)
  Ms. Woolford , Thank you for taking time to come for your Medicare Wellness Visit. I appreciate your ongoing commitment to your health goals. Please review the following plan we discussed and let me know if I can assist you in the future.   These are the goals we discussed: Goals    .  Acknowledge receipt of Medical illustrator    . Exercise 150 min/wk Moderate Activity       This is a list of the screening recommended for you and due dates:  Health Maintenance  Topic Date Due  . COVID-19 Vaccine (1) Never done  . Hepatitis C Screening: USPSTF Recommendation to screen - Ages 56-79 yo.  Never done  . Tetanus Vaccine  06/17/2027*  . Flu Shot  07/21/2021  . Pap Smear  10/19/2021  . Zoster (Shingles) Vaccine (1 of 2) 08/22/2034  . HIV Screening  Completed  . HPV Vaccine  Aged Out  *Topic was postponed. The date shown is not the original due date.

## 2021-05-21 LAB — URINE CYTOLOGY ANCILLARY ONLY
Bacterial Vaginitis (gardnerella): NEGATIVE
Candida Glabrata: NEGATIVE
Candida Vaginitis: NEGATIVE
Chlamydia: NEGATIVE
Comment: NEGATIVE
Comment: NEGATIVE
Comment: NEGATIVE
Comment: NEGATIVE
Comment: NEGATIVE
Comment: NORMAL
Neisseria Gonorrhea: NEGATIVE
Trichomonas: NEGATIVE

## 2021-05-21 LAB — CBC WITH DIFFERENTIAL/PLATELET
Basophils Absolute: 0 10*3/uL (ref 0.0–0.2)
Basos: 1 %
EOS (ABSOLUTE): 0.2 10*3/uL (ref 0.0–0.4)
Eos: 4 %
Hematocrit: 37 % (ref 34.0–46.6)
Hemoglobin: 12 g/dL (ref 11.1–15.9)
Immature Grans (Abs): 0 10*3/uL (ref 0.0–0.1)
Immature Granulocytes: 0 %
Lymphocytes Absolute: 1.7 10*3/uL (ref 0.7–3.1)
Lymphs: 27 %
MCH: 27.2 pg (ref 26.6–33.0)
MCHC: 32.4 g/dL (ref 31.5–35.7)
MCV: 84 fL (ref 79–97)
Monocytes Absolute: 0.4 10*3/uL (ref 0.1–0.9)
Monocytes: 7 %
Neutrophils Absolute: 3.9 10*3/uL (ref 1.4–7.0)
Neutrophils: 61 %
Platelets: 350 10*3/uL (ref 150–450)
RBC: 4.41 x10E6/uL (ref 3.77–5.28)
RDW: 13.8 % (ref 11.7–15.4)
WBC: 6.3 10*3/uL (ref 3.4–10.8)

## 2021-05-21 LAB — T4, FREE: Free T4: 0.94 ng/dL (ref 0.82–1.77)

## 2021-05-21 LAB — HCV RNA QUANT RFLX ULTRA OR GENOTYP: HCV Quant Baseline: NOT DETECTED IU/mL

## 2021-05-21 LAB — BASIC METABOLIC PANEL
BUN/Creatinine Ratio: 17 (ref 9–23)
BUN: 12 mg/dL (ref 6–20)
CO2: 25 mmol/L (ref 20–29)
Calcium: 9.7 mg/dL (ref 8.7–10.2)
Chloride: 102 mmol/L (ref 96–106)
Creatinine, Ser: 0.71 mg/dL (ref 0.57–1.00)
Glucose: 98 mg/dL (ref 65–99)
Potassium: 4.4 mmol/L (ref 3.5–5.2)
Sodium: 142 mmol/L (ref 134–144)
eGFR: 113 mL/min/{1.73_m2} (ref >59)

## 2021-05-21 LAB — TSH: TSH: 2.78 u[IU]/mL (ref 0.450–4.500)

## 2021-05-21 LAB — HEMOGLOBIN A1C
Est. average glucose Bld gHb Est-mCnc: 126 mg/dL
Hgb A1c MFr Bld: 6 % — ABNORMAL HIGH (ref 4.8–5.6)

## 2021-10-03 DIAGNOSIS — H401123 Primary open-angle glaucoma, left eye, severe stage: Secondary | ICD-10-CM | POA: Diagnosis not present

## 2021-12-25 ENCOUNTER — Ambulatory Visit: Payer: Self-pay | Admitting: *Deleted

## 2021-12-25 NOTE — Telephone Encounter (Signed)
Patient unable to make it to the mobile unit today. Option given for virtual appt tomorrow at [phone]. Patient verbalized understanding.

## 2021-12-25 NOTE — Telephone Encounter (Signed)
Summary: personal discomfort   The patient would like to be contacted by a member of staff when possible   The patient has experienced personal discomfort off and on for multiple months   The patient has noticed significant discharge and personal discomfort   Please contact further when possible        Chief Complaint: vaginal discharge, requesting medication Symptoms: white vaginal discharge, odor of yeast on and off x 2 years and after intercourse, bump on vaginal area  Frequency: on and off x 2 years after having child  Pertinent Negatives: Patient denies fever, abdominal pain Disposition: [x] ED /[] Urgent Care (no appt availability in office) / [x] Appointment(In office/virtual)/ []  [facility]/ [] Home Care/ [] Refused Recommended Disposition /[] [facility]/ []  Follow-up with PCP Additional Notes:  Recommended to be seen with in 24 hours due to bump reported on vaginal area . Appt scheduled for 01/15/22. Please advise if patient can be seen earlier .         Reason for Disposition  [1] Rash (e.g., redness, tiny bumps, sore) of genital area AND [2] present > 24 hours  Answer Assessment - Initial Assessment Questions 1. DISCHARGE: "Describe the discharge." (e.g., white, yellow, green, gray, foamy, cottage cheese-like)     White , yeast like 2. ODOR: "Is there a bad odor?"     Smells like yeast  3. ONSET: "When did the discharge begin?"     On and off since having child 2 years ago and after intercourse  4. RASH: "Is there a rash in that area?" If Yes, ask: "Describe it." (e.g., redness, blisters, sores, bumps)     Bump on outside of vaginal area  5. ABDOMINAL PAIN: "Are you having any abdominal pain?" If Yes, ask: "What does it feel like? " (e.g., crampy, dull, intermittent, constant)      No  6. ABDOMINAL PAIN SEVERITY: If present, ask: "How bad is it?"  (e.g., mild, moderate, severe)  - MILD - doesn't interfere with normal activities   - MODERATE -  interferes with normal activities or awakens from sleep   - SEVERE - patient doesn't want to move (R/O peritonitis)      Denies  7. CAUSE: "What do you think is causing the discharge?" "Have you had the same problem before? What happened then?"     Not sure happens after intercourse  8. OTHER SYMPTOMS: "Do you have any other symptoms?" (e.g., fever, itching, vaginal bleeding, pain with urination, injury to genital area, vaginal foreign body)     Vaginal discharge 9. PREGNANCY: "Is there any chance you are pregnant?" "When was your last menstrual period?"     na  Protocols used: Vaginal Discharge-A-AH

## 2021-12-26 ENCOUNTER — Telehealth (HOSPITAL_BASED_OUTPATIENT_CLINIC_OR_DEPARTMENT_OTHER): Payer: Medicare Other | Admitting: Nurse Practitioner

## 2021-12-26 ENCOUNTER — Encounter: Payer: Self-pay | Admitting: Nurse Practitioner

## 2021-12-26 DIAGNOSIS — B3731 Acute candidiasis of vulva and vagina: Secondary | ICD-10-CM

## 2021-12-26 MED ORDER — FLUCONAZOLE 150 MG PO TABS: ORAL_TABLET | ORAL | 0 refills | Status: DC

## 2021-12-26 NOTE — Progress Notes (Signed)
Virtual Visit Note Due to national recommendations of social distancing due to [place] 19, virtual visit is felt to be most appropriate for this patient at this time.  I discussed the limitations, risks, security and privacy concerns of performing an evaluation and management service by video and the availability of in person appointments. I also discussed with the patient that there may be a patient responsible charge related to this service. The patient expressed understanding and agreed to proceed.    I connected with Boneta Lucks on 12/26/21  at   8:10 AM EST  EDT by VIDEO and verified that I am speaking with the correct person using two identifiers.   Location of Patient: Private Residence   Location of Provider: [place] participating in VIRTUAL visit: Geryl Rankins FNP-BC Boneta Lucks    History of Present Illness: VIRTUAL visit for: Yeast vaginitis  Onset of symptoms 1 month ago.  She notes increased thick, white vaginal discharge.  "Smells like yeast."  She has had a yeast infection in the past and notes similar symptoms.  She denies malodorous discharge which would correlate with BV or any UTI symptoms of hematuria, dysuria, flank pain.  There is a note from [facility] stating patient has a bump on her vagina however patient denies this and states she has a hair bump from shaving in the mons pubis area below her abdomen.   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     Past Surgical History:  Procedure Laterality Date   DILATION AND CURETTAGE OF UTERUS     EYE SURGERY     prosthetic right eye    Family History  Problem Relation Age of Onset   Diabetes Mother    Arthritis Mother     Social History   Socioeconomic History   Marital status: Married    Spouse name: Not on  file   Number of children: Not on file   Years of education: Not on file   Highest education level: Not on file  Occupational History   Not on file  Tobacco Use   Smoking status: Never   Smokeless tobacco: Never  Vaping Use   Vaping Use: Never used  Substance and Sexual Activity   Alcohol use: Not Currently   Drug use: No   Sexual activity: Yes    Partners: Male    Birth control/protection: None    Comment: pregnant   Other Topics Concern   Not on file  Social History Narrative   Not on file   Social Determinants of Health   Financial Resource Strain: Not on file  Food Insecurity: Not on file  Transportation Needs: Not on file  Physical Activity: Not on file  Stress: Not on file  Social Connections: Not on file     Observations/Objective: Awake, alert and oriented x 3   Review of Systems  Constitutional:  Negative for fever, malaise/fatigue and weight loss.  HENT: Negative.  Negative for nosebleeds.   Eyes: Negative.  Negative for blurred vision, double vision and photophobia.  Respiratory: Negative.  Negative for cough and shortness of breath.   Cardiovascular: Negative.  Negative for chest pain, palpitations and leg swelling.  Gastrointestinal: Negative.  Negative for heartburn, nausea and vomiting.  Genitourinary:        See HPI  Musculoskeletal: Negative.  Negative for myalgias.  Neurological: Negative.  Negative for dizziness, focal weakness, seizures and headaches.  Psychiatric/Behavioral: Negative.  Negative for suicidal ideas.    Assessment and Plan: Diagnoses and all orders for this visit:  Yeast vaginitis -     fluconazole (DIFLUCAN) 150 MG tablet; One pill x 1 then repeat in 3 days if needed     Follow Up Instructions Return for Patient would still like to keep appointment on January 26. Check A1c.      I discussed the assessment and treatment plan with the patient. The patient was provided an opportunity to ask questions and all were answered.  The patient agreed with the plan and demonstrated an understanding of the instructions.   The patient was advised to call back or seek an in-person evaluation if the symptoms worsen or if the condition fails to improve as anticipated.  I provided 10 minutes of face-to-face time during this encounter including median intraservice time, reviewing previous notes, labs, imaging, medications and explaining diagnosis and management.  Gildardo Pounds, FNP-BC

## 2022-01-15 ENCOUNTER — Ambulatory Visit: Payer: Medicare Other | Admitting: Physician Assistant

## 2022-01-22 ENCOUNTER — Ambulatory Visit: Payer: Medicare Other | Admitting: Physician Assistant

## 2022-01-28 ENCOUNTER — Ambulatory Visit: Payer: Medicare Other | Attending: Physician Assistant | Admitting: Physician Assistant

## 2022-01-28 ENCOUNTER — Encounter: Payer: Self-pay | Admitting: Physician Assistant

## 2022-01-28 ENCOUNTER — Other Ambulatory Visit (HOSPITAL_COMMUNITY)
Admission: RE | Admit: 2022-01-28 | Discharge: 2022-01-28 | Disposition: A | Discharge status: Home / Self Care | Payer: Medicare Other | Source: Ambulatory Visit | Attending: Physician Assistant | Admitting: Physician Assistant

## 2022-01-28 ENCOUNTER — Other Ambulatory Visit: Payer: Self-pay

## 2022-01-28 VITALS — BP 126/82 | HR 74 | Resp 16 | Wt 78.8 lb

## 2022-01-28 DIAGNOSIS — Z1151 Encounter for screening for human papillomavirus (HPV): Secondary | ICD-10-CM | POA: Insufficient documentation

## 2022-01-28 DIAGNOSIS — B3731 Acute candidiasis of vulva and vagina: Secondary | ICD-10-CM | POA: Diagnosis not present

## 2022-01-28 DIAGNOSIS — R7303 Prediabetes: Secondary | ICD-10-CM | POA: Insufficient documentation

## 2022-01-28 DIAGNOSIS — Z124 Encounter for screening for malignant neoplasm of cervix: Secondary | ICD-10-CM | POA: Insufficient documentation

## 2022-01-28 DIAGNOSIS — N898 Other specified noninflammatory disorders of vagina: Secondary | ICD-10-CM | POA: Insufficient documentation

## 2022-01-28 DIAGNOSIS — Z7182 Exercise counseling: Secondary | ICD-10-CM | POA: Insufficient documentation

## 2022-01-28 DIAGNOSIS — Z79899 Other long term (current) drug therapy: Secondary | ICD-10-CM | POA: Insufficient documentation

## 2022-01-28 DIAGNOSIS — Z01419 Encounter for gynecological examination (general) (routine) without abnormal findings: Secondary | ICD-10-CM | POA: Insufficient documentation

## 2022-01-28 LAB — GLUCOSE, POCT (MANUAL RESULT ENTRY): POC Glucose: 107 mg/dl — AB (ref 70–99)

## 2022-01-28 MED ORDER — FLUCONAZOLE 150 MG PO TABS: ORAL_TABLET | ORAL | 0 refills | Status: DC

## 2022-01-28 NOTE — Progress Notes (Signed)
Patient ID: Sylvia Thompson, female   DOB: 08/28/1986, 36 y.o.   MRN: [id number]   Audris Speaker, is a 36 y.o. female  [id number]  [id number]:[id number]  DOB - 1986-03-13  Chief Complaint  Patient presents with   Gynecologic Exam       Subjective:   Sylvia Thompson is a 36 y.o. female here today for today with recurrent vaginal itching and need for pap smear.  +FH diabetes and her last A1C=6.0.  +SA.  No BC and not interested in getting on [facility].  No pelvic pain.  No dysuria.  No urinary frequency.   No problems updated.  ALLERGIES: Allergies  Allergen Reactions   Timolol Shortness Of Breath   Latex Rash    PAST MEDICAL HISTORY: 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     MEDICATIONS AT HOME: Prior to Admission medications   Medication Sig Start Date End Date Taking? Authorizing Provider  albuterol (PROAIR HFA) 108 (90 Base) MCG/ACT inhaler Inhale 2 puffs into the lungs every 6 (six) hours as needed for wheezing or shortness of breath. 05/20/21   Hoy Register, MD  brimonidine (ALPHAGAN P) 0.1 % SOLN Place 1 drop into the left eye 2 (two) times daily. Patient not taking: No sig reported    [provider]  Brinzolamide-Brimonidine [facility] OP) Place 1 drop into the left eye at bedtime. Patient not taking: No sig reported    [provider]  cephALEXin (KEFLEX) 500 MG capsule Take 1 capsule (500 mg total) by mouth 2 (two) times daily. 05/20/21   Hoy Register, MD  dorzolamide (TRUSOPT) 2 % ophthalmic solution Place 1 drop into both eyes 2 (two) times daily. Patient not taking: No sig reported    [provider]  ferrous sulfate 325 (65 FE) MG tablet Take 1 tablet (325 mg total) by mouth 2 (two) times daily with a meal. Patient not taking: No sig reported 02/09/19   Brock Bad, MD   fluconazole (DIFLUCAN) 150 MG tablet One pill x 1 then repeat in 3 days if needed 01/28/22   Anders Simmonds, PA-C  ibuprofen (ADVIL) 600 MG tablet Take 1 tablet (600 mg total) by mouth every 6 (six) hours as needed. Patient not taking: Reported on 05/20/2021 10/06/20   Evern Core, PA-C  ibuprofen (ADVIL) 800 MG tablet Take 1 tablet (800 mg total) by mouth every 8 (eight) hours as needed. Patient not taking: No sig reported 05/06/19   Thressa Sheller D, CNM  lidocaine (XYLOCAINE) 2 % solution Use as directed 10 mLs in the mouth or throat as needed for mouth pain. Patient not taking: Reported on 05/20/2021 02/28/21   Particia Nearing, PA-C  metroNIDAZOLE (FLAGYL) 500 MG tablet Take 1 tablet (500 mg total) by mouth 2 (two) times daily. Patient not taking: No sig reported 11/10/19   Fulp, Cammie, MD  norethindrone (MICRONOR) 0.35 MG tablet Take 1 tablet (0.35 mg total) by mouth daily. Patient not taking: No sig reported 06/19/19   Adam Phenix, MD  Prenatal Vit-Fe Fumarate-FA (PRENATAL MULTIVITAMIN) TABS tablet Take 1 tablet by mouth daily at 12 noon. Patient not taking: No sig reported    [provider]    ROS: Neg HEENT Neg resp Neg cardiac Neg GI Neg GU Neg MS  Neg psych Neg neuro  Objective:   Vitals:   01/28/22 0839  BP: 126/82  Pulse: 74  Resp: 16  SpO2: 99%  Weight: 78 lb 12.8 oz (35.7 kg)   Exam General appearance : Awake, alert, not in any distress. Speech Clear. Not toxic looking HEENT: Atraumatic and Normocephalic Chest: Good air entry bilaterally, CTAB.  No rales/rhonchi/wheezing CVS: S1 S2 regular, no murmurs.  JG:GEZMOQHU genitalia WNL.  Pap inserted.  There is some whitish discharge.  Cervix slightly friable and pap and swabs taken.  Bimanual unremarkable.   Extremities: B/L Lower Ext shows no edema, both legs are warm to touch Neurology: Awake alert, and oriented X 3, CN II-XII intact, Non focal Skin: No Rash  Data Review Lab Results   Component Value Date   HGBA1C 6.0 (H) 05/20/2021   HGBA1C 5.9 (H) 07/10/2020   HGBA1C 5.9 (H) 11/10/2019    Assessment & Plan   1. Vaginal discharge - Cervicovaginal ancillary only - fluconazole (DIFLUCAN) 150 MG tablet; One pill x 1 then repeat in 3 days if needed  Dispense: 2 tablet; Refill: 0  2. Screening for cervical cancer - Cytology - PAP([facility])  3. Yeast vaginitis - Glucose (CBG) - fluconazole (DIFLUCAN) 150 MG tablet; One pill x 1 then repeat in 3 days if needed  Dispense: 2 tablet; Refill: 0  4. Prediabetes Last a1c=6.0 04/2021.  She says she will NOT take medications.  I have had a lengthy discussion and provided education about insulin resistance and the intake of too much sugar/refined carbohydrates.  I have advised the patient to work at a goal of eliminating sugary drinks, candy, desserts, sweets, refined sugars, processed foods, and white carbohydrates.  The patient expresses understanding.   - Hemoglobin A1c - Glucose (CBG) - Comprehensive metabolic panel    Patient have been counseled extensively about nutrition and exercise. Other issues discussed during this visit include: low cholesterol diet, weight control and daily exercise, foot care, annual eye examinations at Ophthalmology, importance of adherence with medications and regular follow-up. We also discussed long term complications of uncontrolled diabetes and hypertension.   Return in about 6 months (around 07/28/2022) for with PCP.  The patient was given clear instructions to go to ER or return to medical center if symptoms don't improve, worsen or new problems develop. The patient verbalized understanding. The patient was told to call to get lab results if they haven't heard anything in the next week.      Georgian Co, PA-C [facility], [place] [phone]   01/28/2022, 9:12 AM

## 2022-01-29 ENCOUNTER — Other Ambulatory Visit: Payer: Self-pay | Admitting: Physician Assistant

## 2022-01-29 DIAGNOSIS — N898 Other specified noninflammatory disorders of vagina: Secondary | ICD-10-CM

## 2022-01-29 LAB — COMPREHENSIVE METABOLIC PANEL
ALT: 8 IU/L (ref 0–32)
AST: 13 IU/L (ref 0–40)
Albumin/Globulin Ratio: 1.3 (ref 1.2–2.2)
Albumin: 4.3 g/dL (ref 3.8–4.8)
Alkaline Phosphatase: 59 IU/L (ref 44–121)
BUN/Creatinine Ratio: 16 (ref 9–23)
BUN: 12 mg/dL (ref 6–20)
Bilirubin Total: 0.2 mg/dL (ref 0.0–1.2)
CO2: 22 mmol/L (ref 20–29)
Calcium: 9.3 mg/dL (ref 8.7–10.2)
Chloride: 106 mmol/L (ref 96–106)
Creatinine, Ser: 0.74 mg/dL (ref 0.57–1.00)
Globulin, Total: 3.3 g/dL (ref 1.5–4.5)
Glucose: 100 mg/dL — ABNORMAL HIGH (ref 70–99)
Potassium: 4.5 mmol/L (ref 3.5–5.2)
Sodium: 142 mmol/L (ref 134–144)
Total Protein: 7.6 g/dL (ref 6.0–8.5)
eGFR: 107 mL/min/{1.73_m2} (ref >59)

## 2022-01-29 LAB — CERVICOVAGINAL ANCILLARY ONLY
Bacterial Vaginitis (gardnerella): POSITIVE — AB
Candida Glabrata: NEGATIVE
Candida Vaginitis: NEGATIVE
Chlamydia: NEGATIVE
Comment: NEGATIVE
Comment: NEGATIVE
Comment: NEGATIVE
Comment: NEGATIVE
Comment: NEGATIVE
Comment: NORMAL
Neisseria Gonorrhea: NEGATIVE
Trichomonas: NEGATIVE

## 2022-01-29 LAB — HEMOGLOBIN A1C
Est. average glucose Bld gHb Est-mCnc: 128 mg/dL
Hgb A1c MFr Bld: 6.1 % — ABNORMAL HIGH (ref 4.8–5.6)

## 2022-01-29 MED ORDER — METRONIDAZOLE 500 MG PO TABS: 500.0000 mg | ORAL_TABLET | Freq: Two times a day (BID) | ORAL | 0 refills | Status: DC

## 2022-02-02 LAB — CYTOLOGY - PAP
Comment: NEGATIVE
Diagnosis: NEGATIVE
High risk HPV: NEGATIVE

## 2022-07-23 ENCOUNTER — Other Ambulatory Visit: Payer: Self-pay

## 2022-07-23 DIAGNOSIS — J4520 Mild intermittent asthma, uncomplicated: Secondary | ICD-10-CM

## 2022-07-23 NOTE — Telephone Encounter (Signed)
Copied from CRM [id number]. Topic: General - Other >> Jul 23, 2022 10:16 AM Everette C wrote: Reason for CRM: Medication Refill - Medication: albuterol (PROAIR HFA) 108 (90 Base) MCG/ACT inhaler [[id number]]   Has the patient contacted their pharmacy? Yes.  The patient was directed to contact [facility]  (Agent: If no, request that the patient contact the pharmacy for the refill. If patient does not wish to contact the pharmacy document the reason why and proceed with request.) (Agent: If yes, when and what did the pharmacy advise?)  Preferred Pharmacy (with phone number or street name): [id number] - [place]), [place] - [address] [place] [address] Phone: [phone] Fax: [phone] Hours: Not open 24 hours   Has the patient been seen for an appointment in the last year OR does the patient have an upcoming appointment? Yes.    Agent: Please be advised that RX refills may take up to 3 business days. We ask that you follow-up with your pharmacy.

## 2022-07-24 NOTE — Telephone Encounter (Signed)
Requested medication (s) are due for refill today:   Yes  Requested medication (s) are on the active medication list:   Yes  Future visit scheduled:   No   Last ordered: 05/20/2021 8 g, 3 refills  Returned because a new rx is needed if approved.     Requested Prescriptions  Pending Prescriptions Disp Refills   albuterol (PROAIR HFA) 108 (90 Base) MCG/ACT inhaler 8 g 3    Sig: Inhale 2 puffs into the lungs every 6 (six) hours as needed for wheezing or shortness of breath.     Pulmonology:  Beta Agonists 2 Passed - 07/23/2022 12:07 PM      Passed - Last BP in normal range    BP Readings from Last 1 Encounters:  01/28/22 126/82         Passed - Last Heart Rate in normal range    Pulse Readings from Last 1 Encounters:  01/28/22 74         Passed - Valid encounter within last 12 months    Recent Outpatient Visits           5 months ago Vaginal discharge   [facility] [place], [place], [place]   7 months ago Yeast vaginitis   [facility] [place], Shea Stakes, NP   1 year ago Night sweats   [facility] [place], Odette Horns, MD   2 years ago Vaginal discharge   [facility] [place], [place], [place]   2 years ago History of gestational diabetes   [facility] [place], [place], [place]

## 2022-07-27 ENCOUNTER — Telehealth (HOSPITAL_BASED_OUTPATIENT_CLINIC_OR_DEPARTMENT_OTHER): Payer: Medicare HMO | Admitting: Nurse Practitioner

## 2022-07-27 ENCOUNTER — Encounter: Payer: Self-pay | Admitting: Nurse Practitioner

## 2022-07-27 DIAGNOSIS — J4520 Mild intermittent asthma, uncomplicated: Secondary | ICD-10-CM | POA: Diagnosis not present

## 2022-07-27 MED ORDER — ALBUTEROL SULFATE HFA 108 (90 BASE) MCG/ACT IN AERS: 2.0000 | INHALATION_SPRAY | Freq: Four times a day (QID) | RESPIRATORY_TRACT | 3 refills | Status: DC | PRN

## 2022-07-27 NOTE — Progress Notes (Signed)
Virtual Visit Note  I discussed the limitations, risks, security and privacy concerns of performing an evaluation and management service by video and the availability of in person appointments. I also discussed with the patient that there may be a patient responsible charge related to this service. The patient expressed understanding and agreed to proceed.    I connected with Sylvia Thompson on 07/27/22  at   3:50 PM EDT  EDT by VIDEO and verified that I am speaking with the correct person using two identifiers.   Location of Patient: Private Residence   [facility] of Provider[facility] [employer] [facility]    Persons participating in VIRTUAL visit: Bertram Denver FNP-BC Sylvia Thompson    History of Present Illness: VIRTUAL visit for: asthma  Asthma Follow-up: She has previously been evaluated here for asthma and presents for an asthma follow-up; she is not currently in exacerbation. Symptoms of exacerbation usually  include chest tightness, dyspnea, non-productive cough, and wheezing.  Observed precipitants include cold air, fumes, pollens, smoke, and strong odors.  Current limitations in activity from asthma: none.  Number of days of school or work missed in the last month: not applicable. Number of Emergency Department visits in the previous month: none. Frequency of use of quick-relief meds: as needed a few times per week. The patient reports adherence to this regimen however she is currently out of her inhaler and requesting refills     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     Past Surgical History:  Procedure Laterality Date   DILATION AND CURETTAGE OF UTERUS     EYE SURGERY     prosthetic right eye    Family History  Problem Relation Age of Onset   Diabetes Mother    Arthritis Mother     Social  History   Socioeconomic History   Marital status: Married    Spouse name: Not on file   Number of children: Not on file   Years of education: Not on file   Highest education level: Not on file  Occupational History   Not on file  Tobacco Use   Smoking status: Never   Smokeless tobacco: Never  Vaping Use   Vaping Use: Never used  Substance and Sexual Activity   Alcohol use: Not Currently   Drug use: No   Sexual activity: Yes    Partners: Male    Birth control/protection: None    Comment: pregnant   Other Topics Concern   Not on file  Social History Narrative   Not on file   Social Determinants of Health   Financial Resource Strain: Low Risk  (05/02/2019)   Overall Financial Resource Strain (CARDIA)    Difficulty of Paying Living Expenses: Not hard at all  Food Insecurity: No Food Insecurity (05/02/2019)   Hunger Vital Sign    Worried About Running Out of Food in the Last Year: Never true    Ran Out of Food in the Last Year: Never true  Transportation Needs: Unknown (05/02/2019)   PRAPARE - Administrator, Civil Service (Medical): No    Lack of Transportation (Non-Medical): Not on file  Physical Activity: Not on file  Stress: No Stress Concern Present (05/02/2019)   [employer] of Occupational Health -  Occupational Stress Questionnaire    Feeling of Stress : Only a little  Social Connections: Not on file     Observations/Objective: Awake, alert and oriented x 3   Review of Systems  Constitutional:  Negative for fever, malaise/fatigue and weight loss.  HENT: Negative.  Negative for nosebleeds.   Eyes: Negative.  Negative for blurred vision, double vision and photophobia.  Respiratory: Negative.  Negative for cough and shortness of breath.   Cardiovascular: Negative.  Negative for chest pain, palpitations and leg swelling.  Gastrointestinal: Negative.  Negative for heartburn, nausea and vomiting.  Musculoskeletal: Negative.  Negative for myalgias.   Neurological: Negative.  Negative for dizziness, focal weakness, seizures and headaches.  Psychiatric/Behavioral: Negative.  Negative for suicidal ideas.     Assessment and Plan: Diagnoses and all orders for this visit:  Mild intermittent asthma without complication -     albuterol (PROAIR HFA) 108 (90 Base) MCG/ACT inhaler; Inhale 2 puffs into the lungs every 6 (six) hours as needed for wheezing or shortness of breath. Asthma action plan in place    Follow Up Instructions Return if symptoms worsen or fail to improve.     I discussed the assessment and treatment plan with the patient. The patient was provided an opportunity to ask questions and all were answered. The patient agreed with the plan and demonstrated an understanding of the instructions.   The patient was advised to call back or seek an in-person evaluation if the symptoms worsen or if the condition fails to improve as anticipated.  I provided 10 minutes of face-to-face time during this encounter including median intraservice time, reviewing previous notes, labs, imaging, medications and explaining diagnosis and management.  Claiborne Rigg, FNP-BC

## 2022-08-06 ENCOUNTER — Ambulatory Visit (HOSPITAL_COMMUNITY): Payer: Self-pay

## 2022-08-06 ENCOUNTER — Ambulatory Visit: Payer: Self-pay

## 2022-08-06 ENCOUNTER — Encounter (HOSPITAL_COMMUNITY): Payer: Self-pay

## 2022-08-06 ENCOUNTER — Ambulatory Visit (HOSPITAL_COMMUNITY)
Admission: EM | Admit: 2022-08-06 | Discharge: 2022-08-06 | Disposition: A | Discharge status: Home / Self Care | Payer: Medicare HMO | Attending: Emergency Medicine | Admitting: Emergency Medicine

## 2022-08-06 DIAGNOSIS — R051 Acute cough: Secondary | ICD-10-CM

## 2022-08-06 DIAGNOSIS — R0981 Nasal congestion: Secondary | ICD-10-CM

## 2022-08-06 MED ORDER — IPRATROPIUM-ALBUTEROL 0.5-2.5 (3) MG/3ML IN SOLN
3.0000 mL | Freq: Once | RESPIRATORY_TRACT | Status: AC
  Administered 2022-08-06: 3 mL via RESPIRATORY_TRACT

## 2022-08-06 MED ORDER — IPRATROPIUM-ALBUTEROL 0.5-2.5 (3) MG/3ML IN SOLN
RESPIRATORY_TRACT | Status: AC
  Filled 2022-08-06: qty 3

## 2022-08-06 NOTE — ED Triage Notes (Signed)
Pt states cough for the past 5 days. States she has a history of asthma and her doctor called her in an Albuterol inhaler but it is not helping.

## 2022-08-06 NOTE — Telephone Encounter (Signed)
  Chief Complaint: asthma attack Symptoms: SOB, coughing, wheezing Frequency: started getting worse today. Pt went to moms house who smokes Pertinent Negatives: coughing so much she has vomited Disposition: [] ED /[x] Urgent Care (no appt availability in office) / [] Appointment(In office/virtual)/ []  [place] [facility]/ [] Home Care/ [] Refused Recommended Disposition /[] [facility]/ []  Follow-up with PCP Additional Notes: pt states she has used albuterol inhaler multiple times this morning with no relief and taken some allergy medicine. Advised pt no appts with office, scheduled appt at UC at 1:00 today d/t pt preference.   Reason for Disposition  [1] Continuous (nonstop) coughing AND [2] keeps from working or sleeping AND [3] not improved after 2 or 3 inhaler or nebulizer treatments given 20 minutes apart  Answer Assessment - Initial Assessment Questions 1. RESPIRATORY STATUS: "Describe your breathing?" (e.g., wheezing, shortness of breath, unable to speak, severe coughing)      SOB 2. ONSET: "When did this asthma attack begin?"      Today  3. TRIGGER: "What do you think triggered this attack?" (e.g., URI, exposure to pollen or other allergen, tobacco smoke)      Cigarette smoke 5. SEVERITY: "How bad is this attack?"    - MILD: No SOB at rest, mild SOB with walking, speaks normally in sentences, can lie down, no retractions, pulse < 100. (GREEN Zone: PEFR 80-100%)   - MODERATE: SOB at rest, SOB with minimal exertion and prefers to sit, cannot lie down flat, speaks in phrases, mild retractions, audible wheezing, pulse 100-120. (YELLOW Zone: PEFR 50-79%)    - SEVERE: Struggling for each breath, speaks in single words, struggling to breathe, sitting hunched forward, retractions, usually loud wheezing, sometimes minimal wheezing because of decreased air movement, pulse > 120. (RED Zone: PEFR < 50%).      Mild constant and cough 6. ASTHMA MEDICINES:  "What treatments have you tried?"     - INHALED QUICK RELIEF (RESCUE): "What is your inhaled quick-relief medicine?" (e.g., albuterol, salbutamol) "Do you use an inhaler or a nebulizer?" "How frequently have you been using this medicine?"   - CONTROLLER (LONG-TERM-CONTROL): "Do you take an inhaled steroid? (e.g., Asmanex, Flovent, Pulmicort, Qvar)     Used albuterol inhaler  7. INHALED QUICK-RELIEF TREATMENTS FOR THIS ATTACK: "What treatments have you given yourself so far?" and "How many and how often?" If using an inhaler, ask, "How many puffs?" Note: Routine treatments are 2 puffs every 4 hours as needed. Rescue treatments are 4 puffs repeated every 20 minutes, up to three times as needed.      Albuterol more than 6 times.  8. OTHER SYMPTOMS: "Do you have any other symptoms? (e.g., chest pain, coughing up yellow sputum, fever, runny nose)     Coughing so much starting to vomit  Protocols used: Asthma Attack-A-AH

## 2022-08-06 NOTE — ED Provider Notes (Signed)
[facility]    CSN: [id number] Arrival date & time: 08/06/22  1245     History   Chief Complaint Chief Complaint  Patient presents with   Cough    HPI Sylvia Thompson is a 36 y.o. female.  Presents with 5-day history of cough and congestion. Reports some chest pressure and shortness of breath.  She has tried Claritin that helped with some congestion.  Tried albuterol inhaler without much relief.  Reports history of asthma for which she has used albuterol.  Has not had problems with her asthma in many years.  Her biggest trigger is cigarette smoke and she has two family members who smoke.  She thinks the recent smoke exposure has caused symptoms.  Denies fever, chills, sore throat, chest pain, abdominal pain, vomiting/diarrhea.  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     Patient Active Problem List   Diagnosis Date Noted   Asthma 05/20/2021   Labor and delivery indication for care or intervention 05/04/2019   NSVD (normal spontaneous vaginal delivery) 05/04/2019   GBS (group B Streptococcus carrier), +RV culture, currently pregnant 04/12/2019   Chlamydia 01/03/2019   Marginal insertion of umbilical cord affecting management of mother 12/12/2018   Trichomonal vaginitis in pregnancy, first trimester 11/02/2018   History of preterm delivery- IOL for oligo  11/02/2018   History of gestational diabetes 11/02/2018   Supervision of other normal pregnancy, antepartum 10/19/2018   Nausea and vomiting during pregnancy prior to [redacted] weeks gestation 10/19/2018   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 Authorizing Provider  albuterol (PROAIR HFA) 108 (90 Base) MCG/ACT inhaler Inhale 2 puffs into the lungs every 6 (six) hours as needed for wheezing or shortness of breath. 07/27/22   Claiborne Rigg, NP  ferrous sulfate 325 (65 FE) MG tablet Take 1 tablet (325 mg total) by mouth 2 (two) times daily with a meal. Patient not taking: No sig reported 02/09/19   Brock Bad, MD    Family History Family History  Problem Relation Age of Onset   Diabetes Mother    Arthritis Mother     Social History Social History   Tobacco Use   Smoking status: Never   Smokeless tobacco: Never  Vaping Use   Vaping Use: Never used  Substance Use Topics   Alcohol use: Not Currently   Drug use: No     Allergies   Timolol and Latex   Review of Systems Review of Systems  Respiratory:  Positive for cough.  Per HPI  Physical Exam Triage Vital Signs ED Triage Vitals [08/06/22 1255]  Enc Vitals Group     BP 131/85     Pulse Rate 86     Resp 16     Temp 99.3 F (37.4 C)     Temp Source Oral     SpO2 97 %     Weight      Height      Head Circumference      Peak Flow      Pain Score 0     Pain Loc      Pain Edu?      Excl. in GC?    No data found.  Updated Vital Signs BP 131/85 (BP Location: Right Arm)   Pulse 86   Temp 99.3 F (37.4 C) (Oral)   Resp 16   LMP 07/21/2022 (Approximate)   SpO2 97%    Physical Exam Vitals and nursing note reviewed.  Constitutional:      General: She is not in acute distress. HENT:     Nose: Congestion present.     Mouth/Throat:     Mouth: Mucous membranes are moist.     Pharynx: Uvula midline. No posterior oropharyngeal erythema.     Tonsils: No tonsillar exudate or tonsillar abscesses.  Eyes:     Conjunctiva/sclera: Conjunctivae  normal.  Cardiovascular:     Rate and Rhythm: Normal rate and regular rhythm.     Heart sounds: Normal heart sounds.  Pulmonary:     Effort: Pulmonary effort is normal. No respiratory distress.     Breath sounds: Normal breath sounds. No wheezing.  Musculoskeletal:     Cervical back: Normal range of motion.  Lymphadenopathy:     Cervical: No cervical adenopathy.  Neurological:     Mental Status: She is alert and oriented to person, place, and time.     UC Treatments / Results  Labs (all labs ordered are listed, but only abnormal results are displayed) Labs Reviewed - No data to display  EKG   Radiology No results found.  Procedures Procedures (including critical care time)  Medications Ordered in UC Medications  ipratropium-albuterol (DUONEB) 0.5-2.5 (3) MG/3ML nebulizer solution 3 mL (3 mLs Nebulization Given 08/06/22 1337)    Initial Impression / Assessment and Plan / UC Course  I have reviewed the triage vital signs and the nursing notes.  Pertinent labs & imaging results that were available during my care of the patient were reviewed by me and considered in my medical decision making (see chart for details).  Patient is in no acute distress, stable vitals.  Sating 97% on room air.  Lungs are clear to auscultation in all fields however patient reports shortness of breath and feeling heavy chest.  DuoNeb given and patient states symptoms improved, feeling much better overall. Offered steroid inhaler but patient declined. Patient does have a primary care provider she can follow-up with regarding her asthma management.  Return precautions discussed with strict ED precautions.  Patient agrees with plan  Final Clinical Impressions(s) / UC Diagnoses   Final diagnoses:  Acute cough  Nasal congestion     Discharge Instructions      For cough and congestion you can try using Mucinex twice daily.  I recommend using your inhaler every 6 hours as needed.  Please  follow-up with your primary care provider regarding asthma management.    With any worsening symptoms, please go to the emergency department.     ED Prescriptions  None    PDMP not reviewed this encounter.   Gradyn Shein, [place] 08/06/22 1357

## 2022-08-06 NOTE — Discharge Instructions (Addendum)
For cough and congestion you can try using Mucinex twice daily.  I recommend using your inhaler every 6 hours as needed.  Please follow-up with your primary care provider regarding asthma management.    With any worsening symptoms, please go to the emergency department.

## 2022-08-11 ENCOUNTER — Other Ambulatory Visit: Payer: Self-pay | Admitting: Nurse Practitioner

## 2022-08-11 ENCOUNTER — Ambulatory Visit: Payer: Self-pay | Admitting: *Deleted

## 2022-08-11 DIAGNOSIS — J4520 Mild intermittent asthma, uncomplicated: Secondary | ICD-10-CM

## 2022-08-11 MED ORDER — ALBUTEROL SULFATE (2.5 MG/3ML) 0.083% IN NEBU: 2.5000 mg | INHALATION_SOLUTION | Freq: Four times a day (QID) | RESPIRATORY_TRACT | 1 refills | Status: DC | PRN

## 2022-08-11 NOTE — Telephone Encounter (Signed)
  Chief Complaint: SOB, Cough Symptoms: States cough productive x 1 month, clear mucous, keeps awake at night. Reports SOB mainly with coughing, H/O asthma.States around second hand smoke frequently, taking Claritin, has not taken any OTC cough meds.  Frequency: Seen in UC 08/06/22 "Cough for a month" Pertinent Negatives: Patient denies  Disposition: [] ED /[] Urgent Care (no appt availability in office) / [] Appointment(In office/virtual)/ []  [place] [facility]/ [] Home Care/ [] Refused Recommended Disposition /[] [place] [facility]/ [x]  Follow-up with PCP Additional Notes:  Pt seen in UC 08/06/22 SOB, asthma. Had nebs treatment, was told to CB if she needed nebs ordered, UC provider would send to pharmacy. States nebs helped, still with cough which helped loosen up. Advised to call UC and number provided. Pt expresses frustration. "Why have a doctor if I can never see them? Always sent to UC." Team Leader , RN reached out to [facility] provider, they do not prescribe Nebulizer machine or med.  Declines virtual and mobile clinic.  1030: Called pt back, triaged. Pt declined available appt with Dr. tomorrow, has appt for child at school. Pt is requesting Neb machine and solution order. Assured pt NT would route to practice for review. Care advise provided, advised UC/ED for worsening symptoms. Pt verbalizes understanding. Appreciative of CB. Reason for Disposition  Continuous (nonstop) coughing interferes with work, school, or sleeping  Answer Assessment - Initial Assessment Questions 1. RESPIRATORY STATUS: "Describe your breathing?" (e.g., wheezing, shortness of breath, unable to speak, severe coughing)      SOB yesterday, better today, cough worse.  2. ONSET: "When did this breathing problem begin?"      Seen  08/06/22 3. PATTERN "Does the difficult breathing come and go, or has it been constant since it started?"      Cough is constant, awake at HS. 4. SEVERITY: "How bad is your  breathing?" (e.g., mild, moderate, severe)    - MILD: No SOB at rest, mild SOB with walking, speaks normally in sentences, can lie down, no retractions, pulse < 100.    - MODERATE: SOB at rest, SOB with minimal exertion and prefers to sit, cannot lie down flat, speaks in phrases, mild retractions, audible wheezing, pulse 100-120.    - SEVERE: Very SOB at rest, speaks in single words, struggling to breathe, sitting hunched forward, retractions, pulse > 120      Mild today, but haven't gotten up yet 5. RECURRENT SYMPTOM: "Have you had difficulty breathing before?" If Yes, ask: "When was the last time?" and "What happened that time?"       6. CARDIAC HISTORY: "Do you have any history of heart disease?" (e.g., heart attack, angina, bypass surgery, angioplasty)       7. LUNG HISTORY: "Do you have any history of lung disease?"  (e.g., pulmonary embolus, asthma, emphysema)     Asthma 8. CAUSE: "What do you think is causing the breathing problem?"      Cough 9. OTHER SYMPTOMS: "Do you have any other symptoms? (e.g., dizziness, runny nose, cough, chest pain, fever)     Cough, productive for clear mucous. Vomiting at times with coughing. 10. O2 SATURATION MONITOR:  "Do you use an oxygen saturation monitor (pulse oximeter) at home?" If Yes, ask: "What is your reading (oxygen level) today?" "What is your usual oxygen saturation reading?" (e.g., 95%)  Protocols used: Breathing Difficulty-A-AH

## 2022-08-11 NOTE — Telephone Encounter (Signed)
Nebulizers sent to [facility]

## 2022-08-12 NOTE — Telephone Encounter (Addendum)
DME(nebulizer) sent to  [facility] Left this message on voicemail.

## 2022-08-21 ENCOUNTER — Emergency Department (HOSPITAL_COMMUNITY)
Admission: EM | Admit: 2022-08-21 | Discharge: 2022-08-21 | Disposition: A | Discharge status: Home / Self Care | Payer: Medicare HMO | Attending: Emergency Medicine | Admitting: Emergency Medicine

## 2022-08-21 ENCOUNTER — Other Ambulatory Visit: Payer: Self-pay

## 2022-08-21 ENCOUNTER — Encounter (HOSPITAL_COMMUNITY): Payer: Self-pay

## 2022-08-21 ENCOUNTER — Emergency Department (HOSPITAL_COMMUNITY): Payer: Medicare HMO

## 2022-08-21 DIAGNOSIS — J189 Pneumonia, unspecified organism: Secondary | ICD-10-CM | POA: Insufficient documentation

## 2022-08-21 DIAGNOSIS — R0789 Other chest pain: Secondary | ICD-10-CM | POA: Diagnosis not present

## 2022-08-21 DIAGNOSIS — J45909 Unspecified asthma, uncomplicated: Secondary | ICD-10-CM | POA: Insufficient documentation

## 2022-08-21 DIAGNOSIS — R052 Subacute cough: Secondary | ICD-10-CM | POA: Diagnosis not present

## 2022-08-21 DIAGNOSIS — Z91040 Latex allergy status: Secondary | ICD-10-CM | POA: Insufficient documentation

## 2022-08-21 DIAGNOSIS — R059 Cough, unspecified: Secondary | ICD-10-CM | POA: Diagnosis not present

## 2022-08-21 DIAGNOSIS — Z20822 Contact with and (suspected) exposure to covid-19: Secondary | ICD-10-CM | POA: Insufficient documentation

## 2022-08-21 LAB — URINALYSIS, ROUTINE W REFLEX MICROSCOPIC
Bacteria, UA: NONE SEEN
Bilirubin Urine: NEGATIVE
Glucose, UA: NEGATIVE mg/dL
Ketones, ur: NEGATIVE mg/dL
Leukocytes,Ua: NEGATIVE
Nitrite: NEGATIVE
Protein, ur: NEGATIVE mg/dL
Specific Gravity, Urine: 1.024 (ref 1.005–1.030)
pH: 6 (ref 5.0–8.0)

## 2022-08-21 LAB — PREGNANCY, URINE: Preg Test, Ur: NEGATIVE

## 2022-08-21 LAB — RESP PANEL BY RT-PCR (FLU A&B, COVID) ARPGX2
Influenza A by PCR: NEGATIVE
Influenza B by PCR: NEGATIVE
SARS Coronavirus 2 by RT PCR: NEGATIVE

## 2022-08-21 LAB — CBG MONITORING, ED: Glucose-Capillary: 106 mg/dL — ABNORMAL HIGH (ref 70–99)

## 2022-08-21 MED ORDER — BENZONATATE 100 MG PO CAPS: 100.0000 mg | ORAL_CAPSULE | Freq: Three times a day (TID) | ORAL | 0 refills | Status: DC

## 2022-08-21 MED ORDER — HYDROCODONE BIT-HOMATROP MBR 5-1.5 MG/5ML PO SOLN: 5.0000 mL | Freq: Four times a day (QID) | ORAL | 0 refills | Status: DC | PRN

## 2022-08-21 MED ORDER — AZITHROMYCIN 250 MG PO TABS: 250.0000 mg | ORAL_TABLET | Freq: Every day | ORAL | 0 refills | Status: DC

## 2022-08-21 NOTE — ED Triage Notes (Signed)
Patient reports that she had had a productive cough with clear sputum since 08/01/22 [facility] went [facility] and was prescribed Mucinex and Robitussin. Patient states she has "chest pain from coughing so much."  Patient also reports urinary frequency and urgency x 2-3 days. Patient states her urine smells like ammonia.

## 2022-08-21 NOTE — Discharge Instructions (Addendum)
Your work-up overall today was reassuring.  X-ray did not show any evidence of pneumonia.  Urine did not show evidence of UTI.  Your COVID and flu test was negative.  I have sent antibiotic into the pharmacy for you to cover atypical pneumonia.  With I did send in cough syrup called Hycodan into the pharmacy for you.  You stated you did not do well with narcotic pain medicine.  If you cannot tolerate this do not take it.  I have sent in [facility] which is also cough medicine into your pharmacy.  This may not work as well.  Continue taking your Robitussin.  For any worsening symptoms return to the emergency room otherwise follow-up with your primary care provider.

## 2022-08-21 NOTE — ED Provider Triage Note (Signed)
Emergency Medicine Provider Triage Evaluation Note  Sylvia Thompson , a 36 y.o. female  was evaluated in triage.  Pt complains of cough for several weeks was productive and now is dry. No hemoptysis. +sinus congestion.   States when she coughs her chest hurts and she states now she feels sore all the time. No exertional CP.   Review of Systems  Positive: Cough, congestion, CP  Negative: Fever   Physical Exam  BP (!) 146/75 (BP Location: Left Arm)   Pulse 91   Temp 99.3 F (37.4 C) (Oral)   Resp 18   Ht 5' (1.524 m)   Wt 79.4 kg   LMP 07/21/2022 (Approximate)   SpO2 99%   BMI 34.18 kg/m  Gen:   Awake, no distress   Resp:  Normal effort  MSK:   Moves extremities without difficulty  Other:  Reproducible R upper chest wall TTP.   Medical Decision Making  Medically screening exam initiated at 2:59 PM.  Appropriate orders placed.  WANZA SZUMSKI was informed that the remainder of the evaluation will be completed by another provider, this initial triage assessment does not replace that evaluation, and the importance of remaining in the ED until their evaluation is complete.  Chest xray Covid/flu Urine - seems to be urinating more but primarily when she is coughing she feels urgency.   Gailen Shelter, [place] 08/21/22 1501

## 2022-08-21 NOTE — ED Provider Notes (Signed)
[facility]-EMERGENCY DEPT Provider Note   CSN: [id number] Arrival date & time: 08/21/22  1437     History  Chief Complaint  Patient presents with   Cough   Chest Pain    Sylvia Thompson is a 36 y.o. female.  36 year old female presents today for evaluation of URI symptoms including productive cough ongoing for multiple weeks.  She does have history of asthma and reports wheezing.  Currently she is not wheezing.  Does have albuterol inhaler, and breathing treatments at home.  She endorses chest pain with cough, or palpation.  Denies recent long travel, history of blood clots, OCP use.  Denies prior history of blood clots.  Has taken Mucinex with some improvement.  Reports significant decrease in mucus production.  Still reports significant coughing spells.  Otherwise denies shortness of breath.  Tolerating p.o. intake.  Also reports increased urinary frequency.  Denies dysuria.  The history is provided by the patient. No language interpreter was used.       Home Medications Prior to Admission medications   Medication Sig Start Date End Date Taking? Authorizing Provider  albuterol (PROAIR HFA) 108 (90 Base) MCG/ACT inhaler Inhale 2 puffs into the lungs every 6 (six) hours as needed for wheezing or shortness of breath. 07/27/22   Claiborne Rigg, NP  albuterol (PROVENTIL) (2.5 MG/3ML) 0.083% nebulizer solution Take 3 mLs (2.5 mg total) by nebulization every 6 (six) hours as needed for wheezing or shortness of breath. 08/11/22   Claiborne Rigg, NP  ferrous sulfate 325 (65 FE) MG tablet Take 1 tablet (325 mg total) by mouth 2 (two) times daily with a meal. Patient not taking: No sig reported 02/09/19   Brock Bad, MD      Allergies    Timolol and Latex    Review of Systems   Review of Systems  Respiratory:  Positive for cough. Negative for chest tightness and shortness of breath.   All other systems reviewed and are negative.   Physical Exam Updated  Vital Signs BP (!) 146/75 (BP Location: Left Arm)   Pulse 91   Temp 99.3 F (37.4 C) (Oral)   Resp 18   Ht 5' (1.524 m)   Wt 79.4 kg   LMP 07/21/2022 (Approximate)   SpO2 99%   BMI 34.18 kg/m  Physical Exam Vitals and nursing note reviewed.  Constitutional:      General: She is not in acute distress.    Appearance: Normal appearance. She is not ill-appearing.  HENT:     Head: Normocephalic and atraumatic.     Nose: Nose normal.  Eyes:     General: No scleral icterus.    Extraocular Movements: Extraocular movements intact.     Conjunctiva/sclera: Conjunctivae normal.  Cardiovascular:     Rate and Rhythm: Normal rate and regular rhythm.     Pulses: Normal pulses.     Heart sounds: Normal heart sounds.  Pulmonary:     Effort: Pulmonary effort is normal. No respiratory distress.     Breath sounds: Normal breath sounds. No wheezing or rales.  Abdominal:     General: There is no distension.     Tenderness: There is no abdominal tenderness.  Musculoskeletal:        General: No deformity. Normal range of motion.     Cervical back: Normal range of motion.  Skin:    General: Skin is warm and dry.     Findings: No rash.  Neurological:  General: No focal deficit present.     Mental Status: She is alert. Mental status is at baseline.     ED Results / Procedures / Treatments   Labs (all labs ordered are listed, but only abnormal results are displayed) Labs Reviewed  URINALYSIS, ROUTINE W REFLEX MICROSCOPIC - Abnormal; Notable for the following components:      Result Value   Hgb urine dipstick MODERATE (*)    All other components within normal limits  RESP PANEL BY RT-PCR (FLU A&B, COVID) ARPGX2  PREGNANCY, URINE  CBG MONITORING, ED    EKG EKG Interpretation  Date/Time:  Friday August 21 2022 14:46:06 EDT Ventricular Rate:  92 PR Interval:  121 QRS Duration: 94 QT Interval:  338 QTC Calculation: 419 R Axis:   48 Text Interpretation: Sinus rhythm Baseline  wander in lead(s) I III aVL No significant change since last tracing Confirmed by Jacalyn Lefevre [phone]) on 08/21/2022 5:51:56 PM  Radiology DG Chest 2 View  Result Date: 08/21/2022 CLINICAL DATA:  Cough EXAM: CHEST - 2 VIEW COMPARISON:  Chest two views 02/07/2013 FINDINGS: Cardiac silhouette and mediastinal contours are within normal limits. The lungs are clear. No pleural effusion or pneumothorax. No acute skeletal abnormality. IMPRESSION: No active cardiopulmonary disease. Electronically Signed   By: Neita Garnet M.D.   On: 08/21/2022 16:35    Procedures Procedures    Medications Ordered in ED Medications - No data to display  ED Course/ Medical Decision Making/ A&P                           Medical Decision Making Risk Prescription drug management.   Medical Decision Making / ED Course   This patient presents to the ED for concern of productive cough, chest pain, this involves an extensive number of treatment options, and is a complaint that carries with it a high risk of complications and morbidity.  The differential diagnosis includes pneumonia, PE, ACS, myocarditis  MDM: 36 year old female presents today for evaluation of above-mentioned symptoms.  History significant for asthma.  Currently on exam she is not wheezing.  Has been using breathing treatment, albuterol inhaler at home as needed.  Chest pain is only present with exertion.  EKG does not show signs of acute ischemic changes.  Chest x-ray without evidence of pneumonia, or other acute cardiopulmonary process.  UA without evidence of UTI.  She is not tachycardic.  No significant signs of dehydration.  Tolerating p.o. intake.  Reports improvement in mucus production.  Will prescribe Hycodan for cough suppression given significant coughing spells.  Multiple coughing spells noted during my exam.  COVID and flu negative.  Hycodan, Tessalon Perles prescribed.  Zithromax prescribed for potential of atypical pneumonia given  duration of symptoms.  Most likely patient has postinfectious cough.  Return precautions discussed.  Patient voices understanding and is in agreement with plan.   Lab Tests: -I ordered, reviewed, and interpreted labs.   The pertinent results include:   Labs Reviewed  URINALYSIS, ROUTINE W REFLEX MICROSCOPIC - Abnormal; Notable for the following components:      Result Value   Hgb urine dipstick MODERATE (*)    All other components within normal limits  RESP PANEL BY RT-PCR (FLU A&B, COVID) ARPGX2  PREGNANCY, URINE  CBG MONITORING, ED      EKG  EKG Interpretation  Date/Time:  Friday August 21 2022 14:46:06 EDT Ventricular Rate:  92 PR Interval:  121 QRS Duration: 94 QT  Interval:  338 QTC Calculation: 419 R Axis:   48 Text Interpretation: Sinus rhythm Baseline wander in lead(s) I III aVL No significant change since last tracing Confirmed by Jacalyn Lefevre [phone]) on 08/21/2022 5:51:56 PM         Imaging Studies ordered: I ordered imaging studies including CXR I independently visualized and interpreted imaging. I agree with the radiologist interpretation   Medicines ordered and prescription drug management: No orders of the defined types were placed in this encounter.   -I have reviewed the patients home medicines and have made adjustments as needed   Reevaluation: After the interventions noted above, I reevaluated the patient and found that they have :stayed the same  Co morbidities that complicate the patient evaluation  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       Dispostion: Patient is appropriate for discharge.  Discharged in stable condition.  Return precautions discussed.  mission, treatments were needed:1} Final Clinical Impression(s) / ED Diagnoses Final diagnoses:  Subacute cough  Atypical  pneumonia    Rx / DC Orders ED Discharge Orders          Ordered    HYDROcodone bit-homatropine (HYCODAN) 5-1.5 MG/5ML syrup  Every 6 hours PRN        08/21/22 1916    azithromycin (ZITHROMAX Z-PAK) 250 MG tablet  Daily        08/21/22 1935    benzonatate (TESSALON) 100 MG capsule  Every 8 hours        08/21/22 1938              Marita [place], PA-C 08/21/22 2003    Jacalyn Lefevre, MD 08/21/22 2320

## 2022-09-14 ENCOUNTER — Ambulatory Visit: Payer: Self-pay | Admitting: *Deleted

## 2022-09-14 NOTE — Telephone Encounter (Signed)
Answer Assessment - Initial Assessment Questions 1. LOCATION: "Where does it hurt?"       R chest- pain with breathing, cough 2. RADIATION: "Does the pain go anywhere else?" (e.g., into neck, jaw, arms, back)     *No Answer* 3. ONSET: "When did the chest pain begin?" (Minutes, hours or days)      2 months 4. PATTERN: "Does the pain come and go, or has it been constant since it started?"  "Does it get worse with exertion?"      Constant- 2 months 5. DURATION: "How long does it last" (e.g., seconds, minutes, hours)       6. SEVERITY: "How bad is the pain?"  (e.g., Scale 1-10; mild, moderate, or severe)    - MILD (1-3): doesn't interfere with normal activities     - MODERATE (4-7): interferes with normal activities or awakens from sleep    - SEVERE (8-10): excruciating pain, unable to do any normal activities       Moderate/severe 7. CARDIAC RISK FACTORS: "Do you have any history of heart problems or risk factors for heart disease?" (e.g., angina, prior heart attack; diabetes, high blood pressure, high cholesterol, smoker, or strong family history of heart disease)     no 8. PULMONARY RISK FACTORS: "Do you have any history of lung disease?"  (e.g., blood clots in lung, asthma, emphysema, birth control pills)     no 9. CAUSE: "What do you think is causing the chest pain?"     unsure 10. OTHER SYMPTOMS: "Do you have any other symptoms?" (e.g., dizziness, nausea, vomiting, sweating, fever, difficulty breathing, cough)       Cough, chest pain 11. PREGNANCY: "Is there any chance you are pregnant?" "When was your last menstrual period?"  Protocols used: Chest Pain-A-AH

## 2022-09-14 NOTE — Telephone Encounter (Signed)
  Chief Complaint: chest pain- cough Symptoms: cough-chest pain Frequency: 2 months- multiple ED visits for symptoms Pertinent Negatives: Patient denies hx cardiac/pulmonary Disposition: [] ED /[x] Urgent Care (no appt availability in office) / [] Appointment(In office/virtual)/ []  [place] [facility]/ [] Home Care/ [] Refused Recommended Disposition /[] [place] [facility]/ []  Follow-up with PCP Additional Notes: Attempted to contact office for appointment- no response in teams. Patient very frustrated- advised UC/mobile unit- she disconnected the call

## 2022-09-16 NOTE — Telephone Encounter (Signed)
Patient was called back, attempt to schedule a virtual apt tomorrow with PCP. She declines apt. States she had found her another doctor. She has had cough for too long and tired of not being able to be seen when she needs evaluation.   Apoolgized to patient for inconvenience and cal was disconnected.

## 2022-09-25 ENCOUNTER — Telehealth: Payer: Self-pay | Admitting: Internal Medicine

## 2022-09-25 NOTE — Telephone Encounter (Signed)
Copied from [place]. Topic: General - Other >> Sep 24, 2022  5:07 PM Cyndi Bender wrote: Reason for CRM: Terri with [employer] reports that patient has [facility] for insurance and they do not have a contract with that insurance. Terri stated [facility] has a Soil scientist with [employer]. Cb# [phone]

## 2022-09-28 ENCOUNTER — Encounter: Payer: Self-pay | Admitting: Nurse Practitioner

## 2022-09-28 ENCOUNTER — Other Ambulatory Visit (HOSPITAL_COMMUNITY)
Admission: RE | Admit: 2022-09-28 | Discharge: 2022-09-28 | Disposition: A | Discharge status: Home / Self Care | Payer: Medicare HMO | Source: Ambulatory Visit | Attending: Nurse Practitioner | Admitting: Nurse Practitioner

## 2022-09-28 ENCOUNTER — Ambulatory Visit (INDEPENDENT_AMBULATORY_CARE_PROVIDER_SITE_OTHER): Payer: Medicare HMO | Admitting: Nurse Practitioner

## 2022-09-28 VITALS — BP 130/72 | HR 70 | Temp 97.6°F | Ht 60.0 in | Wt 178.2 lb

## 2022-09-28 DIAGNOSIS — N898 Other specified noninflammatory disorders of vagina: Secondary | ICD-10-CM | POA: Diagnosis not present

## 2022-09-28 DIAGNOSIS — J4540 Moderate persistent asthma, uncomplicated: Secondary | ICD-10-CM | POA: Insufficient documentation

## 2022-09-28 DIAGNOSIS — H548 Legal blindness, as defined in USA: Secondary | ICD-10-CM

## 2022-09-28 DIAGNOSIS — J4541 Moderate persistent asthma with (acute) exacerbation: Secondary | ICD-10-CM | POA: Diagnosis not present

## 2022-09-28 DIAGNOSIS — R7303 Prediabetes: Secondary | ICD-10-CM | POA: Diagnosis not present

## 2022-09-28 MED ORDER — ARNUITY ELLIPTA 200 MCG/ACT IN AEPB: 1.0000 | INHALATION_SPRAY | Freq: Every day | RESPIRATORY_TRACT | 2 refills | Status: DC

## 2022-09-28 NOTE — Assessment & Plan Note (Signed)
Her symptoms have worsened over the past few months.  She states that she has a lot of chest tightness, wheezing, and cough especially at night.  She has been using her albuterol nebulizer almost every hour.  Her symptoms tend to be worse when she is at home.  Educated her that she may want to have the house checked for mold if her symptoms are worse at home.  We will have her start Arnuity inhaler daily.  Discussed to rinse her mouth out or brush her teeth after using this inhaler.  Continue to use the albuterol nebulizer or inhaler every 4 hours as needed.  Recent x-ray reviewed and was negative for cardiopulmonary disease.  Discussed that she can start Claritin or Zyrtec daily, however she is hesitant to take medications.  Follow-up in 2 weeks.

## 2022-09-28 NOTE — Assessment & Plan Note (Signed)
She has a history of glaucoma and is legally blind.  She has a prosthetic right eye.  She follows with ophthalmology routinely.  Continue collaboration's recommendations from specialist.

## 2022-09-28 NOTE — Patient Instructions (Signed)
It was great to see you!  Start arnuity ellipta inhaler once a day. Rinse your mouth out with water or brush your teeth after using. Keep using your nebulizer and albuterol inhaler as needed for symptoms.   You can also start claritin (loratadine) or zyrtec (cetirizine) daily.   Let's follow-up in 2-3 weeks, sooner if you have concerns.  If a referral was placed today, you will be contacted for an appointment. Please note that routine referrals can sometimes take up to 3-4 weeks to process. Please call our office if you haven't heard anything after this time frame.  Take care,  Vance Peper, NP

## 2022-09-28 NOTE — Progress Notes (Signed)
New Patient Visit  BP 130/72   Pulse 70   Temp 97.6 F (36.4 C) (Temporal)   Ht 5' (1.524 m)   Wt 178 lb 3.2 oz (80.8 kg)   LMP 09/21/2022 (Approximate)   SpO2 98%   BMI 34.80 kg/m    Subjective:    Patient ID: Sylvia Thompson, female    DOB: 06-May-1986, 36 y.o.   MRN: [id number]  CC: Chief Complaint  Patient presents with   Establish Care    Np. Est care. Pt c/o chest pain/tightness w/ wheezing during night time off and on for 3 mos.     HPI: Sylvia Thompson is a 36 y.o. female presents for new patient visit to establish care.  Introduced to Publishing rights manager role and practice setting.  All questions answered.  Discussed provider/patient relationship and expectations.  She has a history of glaucoma, and is legally legally blind. She has a prosthetic right eye. She last saw an eye doctor 6 months ago.   She has been experiencing chest tightness, wheezing, and coughing for the last few months. She has a history of asthma, but it had not bothered her until recently. She went to urgent care and the ER twice in the past few months. She was given an albuterol nebulizer and inhaler to use as needed. She has been using this almost every hour when she is at home. Her symptoms tend to be better during the day when she is out of her house. Her husband smokes outside.   She has also been experiencing vaginal itching for the past few days. She just started her menstrual period but thinks she may have a yeast infection.  She has a history of prediabetes and gestational diabetes. She has been trying to drink more water.   Depression and Anxiety Screen Done:     09/28/2022    2:06 PM 01/28/2022    8:40 AM 05/20/2021    9:58 AM 07/10/2020    9:55 AM 11/10/2019   11:38 AM  Depression screen PHQ 2/9  Decreased Interest 0 0 0 0 0  Down, Depressed, Hopeless 0 0 0 0 0  PHQ - 2 Score 0 0 0 0 0  Altered sleeping 1   0 3  Tired, decreased energy 1   0 3  Change in appetite 1   0 3   Feeling bad or failure about yourself  0   0 0  Trouble concentrating 0   0 0  Moving slowly or fidgety/restless 0   0 0  Suicidal thoughts 0   0 0  PHQ-9 Score 3   0 9      09/28/2022    2:07 PM 01/28/2022    8:40 AM 07/10/2020    9:54 AM 11/10/2019   11:38 AM  GAD 7 : Generalized Anxiety Score  Nervous, Anxious, on Edge 0 0 0 0  Control/stop worrying 0 0 0 0  Worry too much - different things 0 0 0 0  Trouble relaxing 0 0 0 0  Restless 0 0 0 0  Easily annoyed or irritable 0 0 0 0  Afraid - awful might happen 0 0 0 0  Total GAD 7 Score 0 0 0 0    Past Medical History:  Diagnosis Date   Anemia    Asthma    Blindness, legal    Cataract    Chlamydia 01/03/2019   GBS (group B Streptococcus carrier), +RV culture, currently pregnant 04/12/2019  Gestational diabetes    2007   Glaucoma    HPV (human papilloma virus) infection    Prosthetic eye globe     Past Surgical History:  Procedure Laterality Date   DILATION AND CURETTAGE OF UTERUS     EYE SURGERY     prosthetic right eye    Family History  Problem Relation Age of Onset   Diabetes Mother    Arthritis Mother      Social History   Tobacco Use   Smoking status: Never   Smokeless tobacco: Never  Vaping Use   Vaping Use: Never used  Substance Use Topics   Alcohol use: Not Currently   Drug use: No    Current Outpatient Medications on File Prior to Visit  Medication Sig Dispense Refill   albuterol (PROAIR HFA) 108 (90 Base) MCG/ACT inhaler Inhale 2 puffs into the lungs every 6 (six) hours as needed for wheezing or shortness of breath. 18 g 3   albuterol (PROVENTIL) (2.5 MG/3ML) 0.083% nebulizer solution Take 3 mLs (2.5 mg total) by nebulization every 6 (six) hours as needed for wheezing or shortness of breath. 150 mL 1   No current facility-administered medications on file prior to visit.     Review of Systems  Constitutional:  Positive for fatigue. Negative for fever.  HENT: Negative.    Eyes:  Positive  for visual disturbance.  Respiratory:  Positive for cough, chest tightness and wheezing. Negative for shortness of breath.   Cardiovascular: Negative.   Gastrointestinal: Negative.   Genitourinary:  Positive for frequency. Negative for dysuria and hematuria.  Musculoskeletal: Negative.   Skin: Negative.   Neurological: Negative.   Psychiatric/Behavioral: Negative.        Objective:    BP 130/72   Pulse 70   Temp 97.6 F (36.4 C) (Temporal)   Ht 5' (1.524 m)   Wt 178 lb 3.2 oz (80.8 kg)   LMP 09/21/2022 (Approximate)   SpO2 98%   BMI 34.80 kg/m   Wt Readings from Last 3 Encounters:  09/28/22 178 lb 3.2 oz (80.8 kg)  08/21/22 175 lb (79.4 kg)  01/28/22 78 lb 12.8 oz (35.7 kg)    BP Readings from Last 3 Encounters:  09/28/22 130/72  08/21/22 (!) 97/53  08/06/22 131/85    Physical Exam Vitals and nursing note reviewed.  Constitutional:      General: She is not in acute distress.    Appearance: Normal appearance.  HENT:     Head: Normocephalic and atraumatic.     Right Ear: Tympanic membrane, ear canal and external ear normal.     Left Ear: Tympanic membrane, ear canal and external ear normal.  Eyes:     Conjunctiva/sclera: Conjunctivae normal.  Cardiovascular:     Rate and Rhythm: Normal rate and regular rhythm.     Pulses: Normal pulses.     Heart sounds: Normal heart sounds.  Pulmonary:     Effort: Pulmonary effort is normal.     Breath sounds: Normal breath sounds.  Abdominal:     Palpations: Abdomen is soft.     Tenderness: There is no abdominal tenderness.  Musculoskeletal:        General: Normal range of motion.     Cervical back: Normal range of motion and neck supple.  Lymphadenopathy:     Cervical: No cervical adenopathy.  Skin:    General: Skin is warm and dry.  Neurological:     General: No focal deficit present.  Mental Status: She is alert and oriented to person, place, and time.  Psychiatric:        Mood and Affect: Mood normal.         Behavior: Behavior normal.        Thought Content: Thought content normal.        Judgment: Judgment normal.       Assessment & Plan:   Problem List Items Addressed This Visit       Respiratory   Moderate persistent asthma with acute exacerbation - Primary    Her symptoms have worsened over the past few months.  She states that she has a lot of chest tightness, wheezing, and cough especially at night.  She has been using her albuterol nebulizer almost every hour.  Her symptoms tend to be worse when she is at home.  Educated her that she may want to have the house checked for mold if her symptoms are worse at home.  We will have her start Arnuity inhaler daily.  Discussed to rinse her mouth out or brush her teeth after using this inhaler.  Continue to use the albuterol nebulizer or inhaler every 4 hours as needed.  Recent x-ray reviewed and was negative for cardiopulmonary disease.  Discussed that she can start Claritin or Zyrtec daily, however she is hesitant to take medications.  Follow-up in 2 weeks.      Relevant Medications   Fluticasone Furoate (ARNUITY ELLIPTA) 200 MCG/ACT AEPB     Other   Blindness, legal    She has a history of glaucoma and is legally blind.  She has a prosthetic right eye.  She follows with ophthalmology routinely.  Continue collaboration's recommendations from specialist.      Prediabetes    Recent A1c was 6.1%.  We will check labs next visit      Other Visit Diagnoses     Vaginal itching       Vaginal swab sent to test for yeast and BV. Treat based on results.    Relevant Orders   Cervicovaginal ancillary only        Follow up plan: Return in about 3 weeks (around 10/19/2022) for 2-3 weeks asthma.

## 2022-09-28 NOTE — Assessment & Plan Note (Signed)
Recent A1c was 6.1%.  We will check labs next visit

## 2022-09-28 NOTE — Telephone Encounter (Signed)
I spoke to Sylvia Thompson/ [facility] and she explained that they are not in network with [facility] HMO, only [employer] PPO.  She stated that [facility] is in network with patient's insurance.    I called patient and explained about her insurance coverage for the nebulizer and that [facility] is not in network. It does not mean that it is not a covered item, it just means that they would not be able to bill her insurance she would need to pay out of pocket. [facility] will check insurance coverage with her HMO plan and contact her with coverage information. She was in agreement to sending the order to [facility].  She also confirmed that she has established care with a new PCP.   Order for nebulizer faxed to [place]

## 2022-09-29 ENCOUNTER — Telehealth: Payer: Self-pay | Admitting: Nurse Practitioner

## 2022-09-29 LAB — CERVICOVAGINAL ANCILLARY ONLY
Bacterial Vaginitis (gardnerella): POSITIVE — AB
Candida Glabrata: NEGATIVE
Candida Vaginitis: NEGATIVE
Comment: NEGATIVE
Comment: NEGATIVE
Comment: NEGATIVE

## 2022-09-29 MED ORDER — METRONIDAZOLE 500 MG PO TABS: 500.0000 mg | ORAL_TABLET | Freq: Two times a day (BID) | ORAL | 0 refills | Status: AC

## 2022-09-29 NOTE — Addendum Note (Signed)
Addended by: Vance Peper A on: 09/29/2022 11:22 AM   Modules accepted: Orders

## 2022-09-29 NOTE — Telephone Encounter (Signed)
Pt is wanting a cb concerning her lab result from yesterday 09/28/22, pt @ [phone]

## 2022-09-30 NOTE — Telephone Encounter (Signed)
Provider is till reviewing labs, once review is complete someone will contact pt to go over labs. Sw, cma

## 2022-10-01 ENCOUNTER — Telehealth: Payer: Self-pay

## 2022-10-01 NOTE — Telephone Encounter (Signed)
Pt had a question regarding resent test results. Pt did not understand why Bacterial Vaginitis was Positive but Candida Vaginitis was Negative, believing the results were conflicting. Explained to the patient these are two different microorganisms, in her case, one is currently present and the other is not. The ABX sent to the pharmacy treats the microorganism that her test came back positive for. Instructed to reach out to Lauren if symptoms do not improve upon completion of the ordered dose.  Pt also asked about asthma medication. Pt advised to call pharmacy, as this med was sent in by Lauren on 09/28/22.  Pt instructed to call back with any further questions or concerns.

## 2022-10-15 ENCOUNTER — Encounter: Payer: Medicare HMO | Admitting: Internal Medicine

## 2022-10-19 ENCOUNTER — Ambulatory Visit: Payer: Medicare HMO | Admitting: Nurse Practitioner

## 2022-10-29 ENCOUNTER — Telehealth: Payer: Self-pay

## 2022-10-29 NOTE — Telephone Encounter (Signed)
I spoke to Justina/ [facility] who confirmed that the nebulizer has been delivered to the patient.

## 2022-12-07 ENCOUNTER — Telehealth: Payer: Self-pay | Admitting: Nurse Practitioner

## 2022-12-07 NOTE — Telephone Encounter (Signed)
Left message for patient to call back and schedule Medicare Annual Wellness Visit (AWV) in office.   If not able to come in office, please offer to do virtually or by telephone.  Left office number and my jabber #[phone].  Last AWV:05/20/2021  Please schedule at anytime with Nurse Health Advisor.   

## 2023-01-01 ENCOUNTER — Telehealth: Payer: Self-pay | Admitting: Nurse Practitioner

## 2023-01-01 NOTE — Telephone Encounter (Signed)
Left message for patient to call back and schedule Medicare Annual Wellness Visit (AWV) in office.   If not able to come in office, please offer to do virtually or by telephone.  Left office number and my jabber #[phone].  Last AWV:05/20/2021  Please schedule at anytime with Nurse Health Advisor.   

## 2023-02-11 ENCOUNTER — Telehealth: Payer: Self-pay | Admitting: Nurse Practitioner

## 2023-02-11 NOTE — Telephone Encounter (Signed)
Called patient to schedule Medicare Annual Wellness Visit (AWV). Left message for patient to call back and schedule Medicare Annual Wellness Visit (AWV).  Last date of AWV: 05/20/21  Please schedule an appointment at any time with [facility].  If any questions, please contact me at [phone].  Thank you ,  Barkley Boards AWV direct phone # [phone]

## 2023-03-03 ENCOUNTER — Ambulatory Visit: Payer: Medicare HMO | Admitting: Nurse Practitioner

## 2023-03-08 ENCOUNTER — Encounter: Payer: Self-pay | Admitting: Nurse Practitioner

## 2023-03-08 ENCOUNTER — Ambulatory Visit (INDEPENDENT_AMBULATORY_CARE_PROVIDER_SITE_OTHER): Payer: Medicare HMO | Admitting: Nurse Practitioner

## 2023-03-08 VITALS — BP 118/70 | HR 78 | Temp 97.2°F | Ht 60.0 in | Wt 175.8 lb

## 2023-03-08 DIAGNOSIS — J4541 Moderate persistent asthma with (acute) exacerbation: Secondary | ICD-10-CM

## 2023-03-08 DIAGNOSIS — N926 Irregular menstruation, unspecified: Secondary | ICD-10-CM

## 2023-03-08 MED ORDER — MONTELUKAST SODIUM 10 MG PO TABS: 10.0000 mg | ORAL_TABLET | Freq: Every day | ORAL | 1 refills | Status: DC

## 2023-03-08 MED ORDER — FLUTICASONE PROPIONATE 50 MCG/ACT NA SUSP: 2.0000 | Freq: Every day | NASAL | 6 refills | Status: DC

## 2023-03-08 MED ORDER — BUDESONIDE 90 MCG/ACT IN AEPB: 1.0000 | INHALATION_SPRAY | Freq: Two times a day (BID) | RESPIRATORY_TRACT | 3 refills | Status: DC

## 2023-03-08 NOTE — Assessment & Plan Note (Signed)
Chronic, not controlled.  She is still having significant shortness of breath and has restarted having allergy symptoms.  Will have her start Flonase nasal spray daily and montelukast 10 mg daily at bedtime.  The Arnuity Ellipta was giving her a headache, she can stop this and start Pulmicort inhaler twice a day.  Continue albuterol inhaler as needed.  If symptoms or not improving, will place referral to allergy and asthma specialist.

## 2023-03-08 NOTE — Patient Instructions (Signed)
It was great to see you!  Start pulmicort inhaler twice a day, rinse your mouth after using.   Start flonase nasal spray and singulair 1 tablet at bedtime.   You can keep taking the claritin.   Let's follow-up in 4 weeks, sooner if you have concerns.  If a referral was placed today, you will be contacted for an appointment. Please note that routine referrals can sometimes take up to 3-4 weeks to process. Please call our office if you haven't heard anything after this time frame.  Take care,  Vance Peper, NP

## 2023-03-08 NOTE — Progress Notes (Signed)
Acute Office Visit  Subjective:     Patient ID: Sylvia Thompson, female    DOB: 29-Apr-1986, 37 y.o.   MRN: [id number]  Chief Complaint  Patient presents with   Cough    Head and chest congestion, vaginal bleeding     HPI Patient is in today for head congestion and cough. She states that she has been around a lot of dust. She states the arunuity ellipta caused a headache. She denies fevers and wheezing. She feels like her ears are blocked and endorses post nasal drip. She has been taking claritin which helped slightly. She states this started a few weeks ago. She has been using her albuterol inhaler multiple times per day.  She states that her menstrual period was late this month, started as spotting, and then came on fully. Now she is still having spotting, however it is lasting longer than normal.   ROS See pertinent positives and negatives per HPI.     Objective:    BP 118/70 (BP Location: Right Arm, Cuff Size: Large)   Pulse 78   Temp (!) 97.2 F (36.2 C)   Ht 5' (1.524 m)   Wt 175 lb 12.8 oz (79.7 kg)   LMP 02/28/2023 (Approximate)   SpO2 98%   BMI 34.33 kg/m    Physical Exam Vitals and nursing note reviewed.  Constitutional:      General: She is not in acute distress.    Appearance: Normal appearance.  HENT:     Head: Normocephalic.     Right Ear: Tympanic membrane, ear canal and external ear normal.     Left Ear: Tympanic membrane, ear canal and external ear normal.  Eyes:     Conjunctiva/sclera: Conjunctivae normal.  Cardiovascular:     Rate and Rhythm: Normal rate and regular rhythm.     Pulses: Normal pulses.     Heart sounds: Normal heart sounds.  Pulmonary:     Effort: Pulmonary effort is normal.     Breath sounds: Normal breath sounds.  Musculoskeletal:     Cervical back: Normal range of motion and neck supple. No tenderness.  Lymphadenopathy:     Cervical: No cervical adenopathy.  Skin:    General: Skin is warm.  Neurological:     General:  No focal deficit present.     Mental Status: She is alert and oriented to person, place, and time.  Psychiatric:        Mood and Affect: Mood normal.        Behavior: Behavior normal.        Thought Content: Thought content normal.        Judgment: Judgment normal.     No results found for any visits on 03/08/23.      Assessment & Plan:   Problem List Items Addressed This Visit       Respiratory   Moderate persistent asthma with acute exacerbation - Primary    Chronic, not controlled.  She is still having significant shortness of breath and has restarted having allergy symptoms.  Will have her start Flonase nasal spray daily and montelukast 10 mg daily at bedtime.  The Arnuity Ellipta was giving her a headache, she can stop this and start Pulmicort inhaler twice a day.  Continue albuterol inhaler as needed.  If symptoms or not improving, will place referral to allergy and asthma specialist.      Relevant Medications   montelukast (SINGULAIR) 10 MG tablet   Budesonide 90 MCG/ACT inhaler  Other Visit Diagnoses     Irregular periods       Reassured that periods can be longer/shorter than normal some months. Encouraged her to follow-up if conitnues to be irregular or longer than normal.       Meds ordered this encounter  Medications   fluticasone (FLONASE) 50 MCG/ACT nasal spray    Sig: Place 2 sprays into both nostrils daily.    Dispense:  16 g    Refill:  6   montelukast (SINGULAIR) 10 MG tablet    Sig: Take 1 tablet (10 mg total) by mouth at bedtime.    Dispense:  90 tablet    Refill:  1   Budesonide 90 MCG/ACT inhaler    Sig: Inhale 1 puff into the lungs 2 (two) times daily.    Dispense:  1 each    Refill:  3    Return in about 4 weeks (around 04/05/2023) for asthma.  Charyl Dancer, NP

## 2023-03-31 ENCOUNTER — Telehealth: Payer: Self-pay | Admitting: Nurse Practitioner

## 2023-03-31 NOTE — Telephone Encounter (Signed)
Contacted Lavena Stanford to schedule their annual wellness visit. Appointment made for 04/02/23.  Rudell Cobb AWV direct phone # [phone]

## 2023-03-31 NOTE — Telephone Encounter (Signed)
Called patient to schedule Medicare Annual Wellness Visit (AWV). Left message for patient to call back and schedule Medicare Annual Wellness Visit (AWV).  Last date of AWV: 05/20/21  Please schedule an appointment at any time with [facility].  If any questions, please contact me at [phone].  Thank you ,  Travares Nelles [facility] AWV direct phone # [phone] 

## 2023-04-02 ENCOUNTER — Ambulatory Visit (INDEPENDENT_AMBULATORY_CARE_PROVIDER_SITE_OTHER): Payer: Medicare HMO

## 2023-04-02 VITALS — Ht 60.0 in | Wt 170.0 lb

## 2023-04-02 DIAGNOSIS — Z0000 Encounter for general adult medical examination without abnormal findings: Secondary | ICD-10-CM

## 2023-04-02 NOTE — Progress Notes (Signed)
I connected with  Lavena Stanford on 04/02/23 by a audio enabled telemedicine application and verified that I am speaking with the correct person using two identifiers.  Patient Location: Home  Provider Location: Office/Clinic  I discussed the limitations of evaluation and management by telemedicine. The patient expressed understanding and agreed to proceed.  Subjective:   Sylvia Thompson is a 37 y.o. female who presents for Medicare Annual (Subsequent) preventive examination.  Review of Systems     Cardiac Risk Factors include: obesity (BMI >30kg/m2)     Objective:    Today's Vitals   04/02/23 1611  Weight: 170 lb (77.1 kg)  Height: 5' (1.524 m)   Body mass index is 33.2 kg/m.     04/02/2023    4:15 PM 08/21/2022    2:53 PM 05/20/2021    9:56 AM 07/10/2020    9:54 AM 05/04/2019    5:37 PM 05/02/2019   12:03 PM 04/22/2019    2:04 AM  Advanced Directives  Does Patient Have a Medical Advance Directive? No No No No No No No  Would patient like information on creating a medical advance directive?  Yes (ED - Information included in AVS)   No - Patient declined No - Patient declined No - Guardian declined    Current Medications (verified) Outpatient Encounter Medications as of 04/02/2023  Medication Sig   albuterol (PROAIR HFA) 108 (90 Base) MCG/ACT inhaler Inhale 2 puffs into the lungs every 6 (six) hours as needed for wheezing or shortness of breath.   albuterol (PROVENTIL) (2.5 MG/3ML) 0.083% nebulizer solution Take 3 mLs (2.5 mg total) by nebulization every 6 (six) hours as needed for wheezing or shortness of breath.   Budesonide 90 MCG/ACT inhaler Inhale 1 puff into the lungs 2 (two) times daily. (Patient not taking: Reported on 04/02/2023)   fluticasone (FLONASE) 50 MCG/ACT nasal spray Place 2 sprays into both nostrils daily. (Patient not taking: Reported on 04/02/2023)   montelukast (SINGULAIR) 10 MG tablet Take 1 tablet (10 mg total) by mouth at bedtime. (Patient not  taking: Reported on 04/02/2023)   No facility-administered encounter medications on file as of 04/02/2023.    Allergies (verified) Timolol and Latex   History: Past Medical History:  Diagnosis Date   Anemia    Asthma    Blindness, legal    Cataract    Chlamydia 01/03/2019   GBS (group B Streptococcus carrier), +RV culture, currently pregnant 04/12/2019   Gestational diabetes    2007   Glaucoma    HPV (human papilloma virus) infection    Prosthetic eye globe    Past Surgical History:  Procedure Laterality Date   DILATION AND CURETTAGE OF UTERUS     EYE SURGERY     prosthetic right eye   Family History  Problem Relation Age of Onset   Diabetes Mother    Arthritis Mother    Social History   Socioeconomic History   Marital status: Married    Spouse name: Not on file   Number of children: Not on file   Years of education: Not on file   Highest education level: Not on file  Occupational History   Not on file  Tobacco Use   Smoking status: Never   Smokeless tobacco: Never  Vaping Use   Vaping Use: Never used  Substance and Sexual Activity   Alcohol use: Not Currently   Drug use: No   Sexual activity: Yes    Partners: Male    Birth control/protection:  None  Other Topics Concern   Not on file  Social History Narrative   Not on file   Social Determinants of Health   Financial Resource Strain: Low Risk  (04/02/2023)   Overall Financial Resource Strain (CARDIA)    Difficulty of Paying Living Expenses: Not hard at all  Food Insecurity: No Food Insecurity (04/02/2023)   Hunger Vital Sign    Worried About Running Out of Food in the Last Year: Never true    Ran Out of Food in the Last Year: Never true  Transportation Needs: No Transportation Needs (04/02/2023)   PRAPARE - Administrator, Civil Service (Medical): No    Lack of Transportation (Non-Medical): No  Physical Activity: Inactive (04/02/2023)   Exercise Vital Sign    Days of Exercise per Week: 0  days    Minutes of Exercise per Session: 0 min  Stress: No Stress Concern Present (04/02/2023)   [employer] of Occupational Health - Occupational Stress Questionnaire    Feeling of Stress : Not at all  Social Connections: Not on file    Tobacco Counseling Counseling given: Not Answered   Clinical Intake:  Pre-visit preparation completed: Yes  Pain : No/denies pain     Nutritional Status: BMI > 30  Obese Nutritional Risks: None Diabetes: No  How often do you need to have someone help you when you read instructions, pamphlets, or other written materials from your doctor or pharmacy?: 3 - Sometimes  Diabetic? no  Interpreter Needed?: No  Information entered by :: NAllen LPN   Activities of Daily Living    04/02/2023    4:16 PM  In your present state of health, do you have any difficulty performing the following activities:  Hearing? 0  Vision? 1  Difficulty concentrating or making decisions? 0  Walking or climbing stairs? 0  Dressing or bathing? 0  Doing errands, shopping? 1  Preparing Food and eating ? N  Using the Toilet? N  In the past six months, have you accidently leaked urine? Y  Comment with a hard cough  Do you have problems with loss of bowel control? N  Managing your Medications? N  Managing your Finances? N  Housekeeping or managing your Housekeeping? N    Patient Care Team: Gerre Scull, NP as PCP - General (Internal Medicine)  Indicate any recent Medical Services you may have received from other than [facility] providers in the past year (date may be approximate).     Assessment:   This is a routine wellness examination for [facility].  Hearing/Vision screen Vision Screening - Comments:: No regular eye exams  Dietary issues and exercise activities discussed: Current Exercise Habits: The patient does not participate in regular exercise at present   Goals Addressed             This Visit's Progress    Patient Stated        04/02/2023, wants to lose weight       Depression Screen    04/02/2023    4:16 PM 03/08/2023    2:26 PM 09/28/2022    2:06 PM 01/28/2022    8:40 AM 05/20/2021    9:58 AM 07/10/2020    9:55 AM 11/10/2019   11:38 AM  PHQ 2/9 Scores  PHQ - 2 Score 0 0 0 0 0 0 0  PHQ- 9 Score  6 3   0 9    Fall Risk    04/02/2023    4:16 PM 03/08/2023  1:28 PM 01/28/2022    8:40 AM 05/20/2021    9:58 AM 07/10/2020    9:54 AM  Fall Risk   Falls in the past year? 0 0 0 0 0  Number falls in past yr: 0 0 0 0   Injury with Fall? 0 0 0 0   Risk for fall due to : Impaired vision No Fall Risks No Fall Risks    Follow up Falls prevention discussed;Education provided Falls evaluation completed       FALL RISK PREVENTION PERTAINING TO THE HOME:  Any stairs in or around the home? No  If so, are there any without handrails? N/a Home free of loose throw rugs in walkways, pet beds, electrical cords, etc? Yes  Adequate lighting in your home to reduce risk of falls? Yes   ASSISTIVE DEVICES UTILIZED TO PREVENT FALLS:  Life alert? No  Use of a cane, walker or w/c? No  Grab bars in the bathroom? No  Shower chair or bench in shower? No  Elevated toilet seat or a handicapped toilet? No   TIMED UP AND GO:  Was the test performed? No .      Cognitive Function:        04/02/2023    4:17 PM  6CIT Screen  What Year? 0 points  What month? 0 points  What time? 0 points  Count back from 20 0 points  Months in reverse 2 points  Repeat phrase 2 points  Total Score 4 points    Immunizations Immunization History  Administered Date(s) Administered   Td 09/28/2012    TDAP status: Due, Education has been provided regarding the importance of this vaccine. Advised may receive this vaccine at local pharmacy or Health Dept. Aware to provide a copy of the vaccination record if obtained from local pharmacy or Health Dept. Verbalized acceptance and understanding.  Flu Vaccine status: Declined, Education has been  provided regarding the importance of this vaccine but patient still declined. Advised may receive this vaccine at local pharmacy or Health Dept. Aware to provide a copy of the vaccination record if obtained from local pharmacy or Health Dept. Verbalized acceptance and understanding.  Pneumococcal vaccine status: Declined,  Education has been provided regarding the importance of this vaccine but patient still declined. Advised may receive this vaccine at local pharmacy or Health Dept. Aware to provide a copy of the vaccination record if obtained from local pharmacy or Health Dept. Verbalized acceptance and understanding.   Covid-19 vaccine status: Declined, Education has been provided regarding the importance of this vaccine but patient still declined. Advised may receive this vaccine at local pharmacy or Health Dept.or vaccine clinic. Aware to provide a copy of the vaccination record if obtained from local pharmacy or Health Dept. Verbalized acceptance and understanding.  Qualifies for Shingles Vaccine? No   Zostavax completed  n/a   Shingrix Completed?: No.    Education has been provided regarding the importance of this vaccine. Patient has been advised to call insurance company to determine out of pocket expense if they have not yet received this vaccine. Advised may also receive vaccine at local pharmacy or Health Dept. Verbalized acceptance and understanding.  Screening Tests Health Maintenance  Topic Date Due   Hepatitis C Screening  Never done   Medicare Annual Wellness (AWV)  05/20/2022   DTaP/Tdap/Td (2 - Tdap) 09/28/2022   INFLUENZA VACCINE  07/22/2023   PAP SMEAR-Modifier  01/28/2025   HIV Screening  Completed   HPV VACCINES  Aged Out   COVID-19 Vaccine  Discontinued    Health Maintenance  Health Maintenance Due  Topic Date Due   Hepatitis C Screening  Never done   Medicare Annual Wellness (AWV)  05/20/2022   DTaP/Tdap/Td (2 - Tdap) 09/28/2022    Colorectal cancer screening:  n/a  Mammogram status: n/a  Bone Density status: n/a  Lung Cancer Screening: (Low Dose CT Chest recommended if Age 63-80 years, 30 pack-year currently smoking OR have quit w/in 15years.) does not qualify.   Lung Cancer Screening Referral: no  Additional Screening:  Hepatitis C Screening: does qualify;   Vision Screening: Recommended annual ophthalmology exams for early detection of glaucoma and other disorders of the eye. Is the patient up to date with their annual eye exam?  No  Who is the provider or what is the name of the office in which the patient attends annual eye exams? none If pt is not established with a provider, would they like to be referred to a provider to establish care? No .   Dental Screening: Recommended annual dental exams for proper oral hygiene  Community Resource Referral / Chronic Care Management: CRR required this visit?  No   CCM required this visit?  No      Plan:     I have personally reviewed and noted the following in the patient's chart:   Medical and social history Use of alcohol, tobacco or illicit drugs  Current medications and supplements including opioid prescriptions. Patient is not currently taking opioid prescriptions. Functional ability and status Nutritional status Physical activity Advanced directives List of other physicians Hospitalizations, surgeries, and ER visits in previous 12 months Vitals Screenings to include cognitive, depression, and falls Referrals and appointments  In addition, I have reviewed and discussed with patient certain preventive protocols, quality metrics, and best practice recommendations. A written personalized care plan for preventive services as well as general preventive health recommendations were provided to patient.     Barb Merino, LPN   [id number]   Nurse Notes: none  Due to this being a virtual visit, the after visit summary with patients personalized plan was offered to patient via  mail or my-chart.  to pick up at office at next visit

## 2023-04-02 NOTE — Patient Instructions (Signed)
Sylvia Thompson , Thank you for taking time to come for your Medicare Wellness Visit. I appreciate your ongoing commitment to your health goals. Please review the following plan we discussed and let me know if I can assist you in the future.   These are the goals we discussed:  Goals       Acknowledge receipt of Advanced Directive package     Exercise 150 min/wk Moderate Activity     Patient Stated     04/02/2023, wants to lose weight        This is a list of the screening recommended for you and due dates:  Health Maintenance  Topic Date Due   Hepatitis C Screening: USPSTF Recommendation to screen - Ages 104-79 yo.  Never done   DTaP/Tdap/Td vaccine (2 - Tdap) 09/28/2022   Flu Shot  07/22/2023   Medicare Annual Wellness Visit  04/01/2024   Pap Smear  01/28/2025   HIV Screening  Completed   HPV Vaccine  Aged Out   COVID-19 Vaccine  Discontinued    Advanced directives: Advance directive discussed with you today.   Conditions/risks identified: none  Next appointment: Follow up in one year for your annual wellness visit.   Preventive Care 16-60 Years Old, Female Preventive care refers to lifestyle choices and visits with your health care provider that can promote health and wellness. Preventive care visits are also called wellness exams. What can I expect for my preventive care visit? Counseling During your preventive care visit, your health care provider may ask about your: Medical history, including: Past medical problems. Family medical history. Pregnancy history. Current health, including: Menstrual cycle. Method of birth control. Emotional well-being. Home life and relationship well-being. Sexual activity and sexual health. Lifestyle, including: Alcohol, nicotine or tobacco, and drug use. Access to firearms. Diet, exercise, and sleep habits. Work and work Astronomer. Sunscreen use. Safety issues such as seatbelt and bike helmet use. Physical exam Your health  care provider may check your: Height and weight. These may be used to calculate your BMI (body mass index). BMI is a measurement that tells if you are at a healthy weight. Waist circumference. This measures the distance around your waistline. This measurement also tells if you are at a healthy weight and may help predict your risk of certain diseases, such as type 2 diabetes and high blood pressure. Heart rate and blood pressure. Body temperature. Skin for abnormal spots. What immunizations do I need? Vaccines are usually given at various ages, according to a schedule. Your health care provider will recommend vaccines for you based on your age, medical history, and lifestyle or other factors, such as travel or where you work. What tests do I need? Screening Your health care provider may recommend screening tests for certain conditions. This may include: Pelvic exam and Pap test. Lipid and cholesterol levels. Diabetes screening. This is done by checking your blood sugar (glucose) after you have not eaten for a while (fasting). Hepatitis B test. Hepatitis C test. HIV (human immunodeficiency virus) test. STI (sexually transmitted infection) testing, if you are at risk. BRCA-related cancer screening. This may be done if you have a family history of breast, ovarian, tubal, or peritoneal cancers. Talk with your health care provider about your test results, treatment options, and if necessary, the need for more tests. Follow these instructions at home: Eating and drinking  Eat a healthy diet that includes fresh fruits and vegetables, whole grains, lean protein, and low-fat dairy products. Take vitamin and mineral  supplements as recommended by your health care provider. Do not drink alcohol if: Your health care provider tells you not to drink. You are pregnant, may be pregnant, or are planning to become pregnant. If you drink alcohol: Limit how much you have to 0-1 drink a day. Know how much  alcohol is in your drink. In the [place] one drink equals one 12 oz bottle of beer (355 mL), one 5 oz glass of wine (148 mL), or one 1 oz glass of hard liquor (44 mL). Lifestyle Brush your teeth every morning and night with fluoride toothpaste. Floss one time each day. Exercise for at least 30 minutes 5 or more days each week. Do not use any products that contain nicotine or tobacco. These products include cigarettes, chewing tobacco, and vaping devices, such as e-cigarettes. If you need help quitting, ask your health care provider. Do not use drugs. If you are sexually active, practice safe sex. Use a condom or other form of protection to prevent STIs. If you do not wish to become pregnant, use a form of birth control. If you plan to become pregnant, see your health care provider for a prepregnancy visit. Find healthy ways to manage stress, such as: Meditation, yoga, or listening to music. Journaling. Talking to a trusted person. Spending time with friends and family. Minimize exposure to UV radiation to reduce your risk of skin cancer. Safety Always wear your seat belt while driving or riding in a vehicle. Do not drive: If you have been drinking alcohol. Do not ride with someone who has been drinking. If you have been using any mind-altering substances or drugs. While texting. When you are tired or distracted. Wear a helmet and other protective equipment during sports activities. If you have firearms in your house, make sure you follow all gun safety procedures. Seek help if you have been physically or sexually abused. What's next? Go to your health care provider once a year for an annual wellness visit. Ask your health care provider how often you should have your eyes and teeth checked. Stay up to date on all vaccines. This information is not intended to replace advice given to you by your health care provider. Make sure you discuss any questions you have with your health care  provider. Document Revised: 06/04/2021 Document Reviewed: 06/04/2021 Elsevier Patient Education  2022 [employer].

## 2023-04-05 ENCOUNTER — Ambulatory Visit (INDEPENDENT_AMBULATORY_CARE_PROVIDER_SITE_OTHER): Payer: Medicare HMO | Admitting: Nurse Practitioner

## 2023-04-05 ENCOUNTER — Other Ambulatory Visit (HOSPITAL_COMMUNITY)
Admission: RE | Admit: 2023-04-05 | Discharge: 2023-04-05 | Disposition: A | Discharge status: Home / Self Care | Payer: Medicare HMO | Source: Ambulatory Visit | Attending: Nurse Practitioner | Admitting: Nurse Practitioner

## 2023-04-05 ENCOUNTER — Encounter: Payer: Self-pay | Admitting: Nurse Practitioner

## 2023-04-05 VITALS — BP 122/78 | HR 74 | Temp 98.6°F | Ht 60.0 in | Wt 174.2 lb

## 2023-04-05 DIAGNOSIS — J4540 Moderate persistent asthma, uncomplicated: Secondary | ICD-10-CM

## 2023-04-05 DIAGNOSIS — N898 Other specified noninflammatory disorders of vagina: Secondary | ICD-10-CM | POA: Diagnosis not present

## 2023-04-05 DIAGNOSIS — R350 Frequency of micturition: Secondary | ICD-10-CM

## 2023-04-05 LAB — POCT URINALYSIS DIPSTICK
Bilirubin, UA: NEGATIVE
Glucose, UA: NEGATIVE
Ketones, UA: NEGATIVE
Leukocytes, UA: NEGATIVE
Nitrite, UA: NEGATIVE
Protein, UA: NEGATIVE
Spec Grav, UA: 1.015 (ref 1.010–1.025)
Urobilinogen, UA: 0.2 E.U./dL
pH, UA: 6 (ref 5.0–8.0)

## 2023-04-05 LAB — POCT URINE PREGNANCY: Preg Test, Ur: NEGATIVE

## 2023-04-05 MED ORDER — FLUCONAZOLE 150 MG PO TABS: ORAL_TABLET | ORAL | 0 refills | Status: DC

## 2023-04-05 NOTE — Patient Instructions (Signed)
It was great to see you!  We are checking your labs today and will let you know the results via mychart/phone.   Let's follow-up in 3 months, sooner if you have concerns.  If a referral was placed today, you will be contacted for an appointment. Please note that routine referrals can sometimes take up to 3-4 weeks to process. Please call our office if you haven't heard anything after this time frame.  Take care,  Izaac Reisig, NP  

## 2023-04-05 NOTE — Assessment & Plan Note (Signed)
Chronic, stable.  Her symptoms have improved since last visit.  Continue albuterol inhaler as needed and montelukast 10 mg daily as needed.

## 2023-04-05 NOTE — Progress Notes (Signed)
Established Patient Office Visit  Subjective   Patient ID: Sylvia Thompson, female    DOB: 27-Apr-1986  Age: 37 y.o. MRN: [id number]  Chief Complaint  Patient presents with   Dysmenorrhea    No longer spotting, just has cramps    HPI  Sylvia Thompson is here to follow-up on asthma and dysmenorrhea.    She states that her periods are no longer irregular, however she is having some cramping today.  She has also noticed some vaginal discharge that is white in color along with vaginal itching.  She denies fevers.  She has also been experiencing some urinary frequency.  She does have a history of BV and yeast.  She states that her asthma is doing a lot better.  She did not start the budesonide inhaler, however her symptoms have improved.  She denies shortness of breath and wheezing.  She thinks it was related to the pollen and now it is gone down she is doing better.    ROS See pertinent positives and negatives per HPI.    Objective:     BP 122/78 (BP Location: Right Arm)   Pulse 74   Temp 98.6 F (37 C)   Ht 5' (1.524 m)   Wt 174 lb 3.2 oz (79 kg)   LMP 02/28/2023 (Approximate)   SpO2 99%   BMI 34.02 kg/m    Physical Exam Vitals and nursing note reviewed.  Constitutional:      General: She is not in acute distress.    Appearance: Normal appearance.  HENT:     Head: Normocephalic.  Eyes:     Conjunctiva/sclera: Conjunctivae normal.  Cardiovascular:     Rate and Rhythm: Normal rate and regular rhythm.     Pulses: Normal pulses.     Heart sounds: Normal heart sounds.  Pulmonary:     Effort: Pulmonary effort is normal.     Breath sounds: Normal breath sounds.  Musculoskeletal:     Cervical back: Normal range of motion.  Skin:    General: Skin is warm.  Neurological:     General: No focal deficit present.     Mental Status: She is alert and oriented to person, place, and time.  Psychiatric:        Mood and Affect: Mood normal.        Behavior: Behavior  normal.        Thought Content: Thought content normal.        Judgment: Judgment normal.      Assessment & Plan:   Problem List Items Addressed This Visit       Respiratory   Moderate persistent asthma    Chronic, stable.  Her symptoms have improved since last visit.  Continue albuterol inhaler as needed and montelukast 10 mg daily as needed.      Other Visit Diagnoses     Vaginal discharge    -  Primary   Will check wet prep today.  Symptoms consistent with yeast, will treat with Diflucan 150 mg today and again in 3 days if needed   Relevant Orders   Cervicovaginal ancillary only   Urinary frequency       UA negative except for blood.  She states that she did have a history of kidney stones and is due for her menses.  Urine pregnancy negative   Relevant Orders   POCT urine pregnancy (Completed)   POCT urinalysis dipstick (Completed)       Return in about 3  months (around 07/05/2023) for CPE.    Gerre Scull, NP

## 2023-04-07 LAB — CERVICOVAGINAL ANCILLARY ONLY
Bacterial Vaginitis (gardnerella): NEGATIVE
Candida Glabrata: NEGATIVE
Candida Vaginitis: POSITIVE — AB
Comment: NEGATIVE
Comment: NEGATIVE
Comment: NEGATIVE
Comment: NEGATIVE
Trichomonas: NEGATIVE

## 2023-07-04 NOTE — Progress Notes (Deleted)
There were no vitals taken for this visit.   Subjective:    Patient ID: Sylvia Thompson, female    DOB: 08-15-1986, 37 y.o.   MRN: [id number]  CC: No chief complaint on file.   HPI: Sylvia Thompson is a 37 y.o. female presenting on 07/05/2023 for comprehensive medical examination. Current medical complaints include:{Blank single:19197::"none","***"}  She currently lives with: Menopausal Symptoms: {Blank single:19197::"yes","no"}  Depression and Anxiety Screen done today and results listed below:     04/02/2023    4:16 PM 03/08/2023    2:26 PM 09/28/2022    2:06 PM 01/28/2022    8:40 AM 05/20/2021    9:58 AM  Depression screen PHQ 2/9  Decreased Interest 0 0 0 0 0  Down, Depressed, Hopeless 0 0 0 0 0  PHQ - 2 Score 0 0 0 0 0  Altered sleeping  0 1    Tired, decreased energy  3 1    Change in appetite  3 1    Feeling bad or failure about yourself   0 0    Trouble concentrating  0 0    Moving slowly or fidgety/restless  0 0    Suicidal thoughts  0 0    PHQ-9 Score  6 3        03/08/2023    2:27 PM 09/28/2022    2:07 PM 01/28/2022    8:40 AM 07/10/2020    9:54 AM  GAD 7 : Generalized Anxiety Score  Nervous, Anxious, on Edge 0 0 0 0  Control/stop worrying 0 0 0 0  Worry too much - different things 3 0 0 0  Trouble relaxing 3 0 0 0  Restless 0 0 0 0  Easily annoyed or irritable 3 0 0 0  Afraid - awful might happen 0 0 0 0  Total GAD 7 Score 9 0 0 0    The patient {has/does not have:19849} a history of falls. I {did/did not:19850} complete a risk assessment for falls. A plan of care for falls {was/was not:19852} documented.   Past Medical History:  Past Medical History:  Diagnosis Date   Anemia    Asthma    Blindness, legal    Cataract    Chlamydia 01/03/2019   GBS (group B Streptococcus carrier), +RV culture, currently pregnant 04/12/2019   Gestational diabetes    2007   Glaucoma    HPV (human papilloma virus) infection    Prosthetic eye globe     Surgical  History:  Past Surgical History:  Procedure Laterality Date   DILATION AND CURETTAGE OF UTERUS     EYE SURGERY     prosthetic right eye    Medications:  Current Outpatient Medications on File Prior to Visit  Medication Sig   albuterol (PROAIR HFA) 108 (90 Base) MCG/ACT inhaler Inhale 2 puffs into the lungs every 6 (six) hours as needed for wheezing or shortness of breath.   albuterol (PROVENTIL) (2.5 MG/3ML) 0.083% nebulizer solution Take 3 mLs (2.5 mg total) by nebulization every 6 (six) hours as needed for wheezing or shortness of breath. (Patient not taking: Reported on 04/05/2023)   Budesonide 90 MCG/ACT inhaler Inhale 1 puff into the lungs 2 (two) times daily. (Patient not taking: Reported on 04/02/2023)   fluconazole (DIFLUCAN) 150 MG tablet Take 1 tablet today and then a second one in 3 days if needed   fluticasone (FLONASE) 50 MCG/ACT nasal spray Place 2 sprays into both nostrils daily. (Patient not taking:  Reported on 04/02/2023)   montelukast (SINGULAIR) 10 MG tablet Take 1 tablet (10 mg total) by mouth at bedtime. (Patient not taking: Reported on 04/02/2023)   No current facility-administered medications on file prior to visit.    Allergies:  Allergies  Allergen Reactions   Timolol Shortness Of Breath   Latex Rash    Social History:  Social History   Socioeconomic History   Marital status: Married    Spouse name: Not on file   Number of children: Not on file   Years of education: Not on file   Highest education level: Not on file  Occupational History   Not on file  Tobacco Use   Smoking status: Never   Smokeless tobacco: Never  Vaping Use   Vaping status: Never Used  Substance and Sexual Activity   Alcohol use: Not Currently   Drug use: No   Sexual activity: Yes    Partners: Male    Birth control/protection: None  Other Topics Concern   Not on file  Social History Narrative   Not on file   Social Determinants of Health   Financial Resource Strain: Low  Risk  (04/02/2023)   Overall Financial Resource Strain (CARDIA)    Difficulty of Paying Living Expenses: Not hard at all  Food Insecurity: No Food Insecurity (04/02/2023)   Hunger Vital Sign    Worried About Running Out of Food in the Last Year: Never true    Ran Out of Food in the Last Year: Never true  Transportation Needs: No Transportation Needs (04/02/2023)   PRAPARE - Administrator, Civil Service (Medical): No    Lack of Transportation (Non-Medical): No  Physical Activity: Inactive (04/02/2023)   Exercise Vital Sign    Days of Exercise per Week: 0 days    Minutes of Exercise per Session: 0 min  Stress: No Stress Concern Present (04/02/2023)   [employer] of Occupational Health - Occupational Stress Questionnaire    Feeling of Stress : Not at all  Social Connections: Not on file  Intimate Partner Violence: Not At Risk (05/02/2019)   Humiliation, Afraid, Rape, and Kick questionnaire    Fear of Current or Ex-Partner: No    Emotionally Abused: No    Physically Abused: No    Sexually Abused: No   Social History   Tobacco Use  Smoking Status Never  Smokeless Tobacco Never   Social History   Substance and Sexual Activity  Alcohol Use Not Currently    Family History:  Family History  Problem Relation Age of Onset   Diabetes Mother    Arthritis Mother     Past medical history, surgical history, medications, allergies, family history and social history reviewed with patient today and changes made to appropriate areas of the chart.   ROS All other ROS negative except what is listed above and in the HPI.      Objective:    There were no vitals taken for this visit.  Wt Readings from Last 3 Encounters:  04/05/23 174 lb 3.2 oz (79 kg)  04/02/23 170 lb (77.1 kg)  03/08/23 175 lb 12.8 oz (79.7 kg)    Physical Exam  Results for orders placed or performed in visit on 04/05/23  POCT urine pregnancy  Result Value Ref Range   Preg Test, Ur Negative  Negative  POCT urinalysis dipstick  Result Value Ref Range   Color, UA     Clarity, UA     Glucose, UA Negative Negative  Bilirubin, UA Negative    Ketones, UA Negative    Spec Grav, UA 1.015 1.010 - 1.025   Blood, UA 3+    pH, UA 6.0 5.0 - 8.0   Protein, UA Negative Negative   Urobilinogen, UA 0.2 0.2 or 1.0 E.U./dL   Nitrite, UA Negative    Leukocytes, UA Negative Negative   Appearance     Odor    Cervicovaginal ancillary only  Result Value Ref Range   Trichomonas Negative    Bacterial Vaginitis (gardnerella) Negative    Candida Vaginitis Positive (A)    Candida Glabrata Negative    Comment      Normal Reference Range Bacterial Vaginosis - Negative   Comment Normal Reference Range Candida Species - Negative    Comment Normal Reference Range Candida Galbrata - Negative    Comment Normal Reference Range Trichomonas - Negative       Assessment & Plan:   Problem List Items Addressed This Visit   None    Follow up plan: No follow-ups on file.   LABORATORY TESTING:  - Pap smear: up to date  IMMUNIZATIONS:   - Tdap: Tetanus vaccination status reviewed: last tetanus booster within 10 years. - Influenza: Postponed to flu season - Pneumovax: Not applicable - Prevnar: *** - HPV: Not applicable - Zostavax vaccine: Not applicable  SCREENING: -Mammogram: Not applicable  - Colonoscopy: Not applicable  - Bone Density: Not applicable   PATIENT COUNSELING:   Advised to take 1 mg of folate supplement per day if capable of pregnancy.   Sexuality: Discussed sexually transmitted diseases, partner selection, use of condoms, avoidance of unintended pregnancy  and contraceptive alternatives.   Advised to avoid cigarette smoking.  I discussed with the patient that most people either abstain from alcohol or drink within safe limits (<=14/week and <=4 drinks/occasion for males, <=7/weeks and <= 3 drinks/occasion for females) and that the risk for alcohol disorders and other  health effects rises proportionally with the number of drinks per week and how often a drinker exceeds daily limits.  Discussed cessation/primary prevention of drug use and availability of treatment for abuse.   Diet: Encouraged to adjust caloric intake to maintain  or achieve ideal body weight, to reduce intake of dietary saturated fat and total fat, to limit sodium intake by avoiding high sodium foods and not adding table salt, and to maintain adequate dietary potassium and calcium preferably from fresh fruits, vegetables, and low-fat dairy products.    stressed the importance of regular exercise  Injury prevention: Discussed safety belts, safety helmets, smoke detector, smoking near bedding or upholstery.   Dental health: Discussed importance of regular tooth brushing, flossing, and dental visits.    NEXT PREVENTATIVE PHYSICAL DUE IN 1 YEAR. No follow-ups on file.

## 2023-07-05 ENCOUNTER — Telehealth: Payer: Self-pay | Admitting: Nurse Practitioner

## 2023-07-05 ENCOUNTER — Encounter: Payer: Medicare HMO | Admitting: Nurse Practitioner

## 2023-07-05 DIAGNOSIS — Z0000 Encounter for general adult medical examination without abnormal findings: Secondary | ICD-10-CM

## 2023-07-05 DIAGNOSIS — H548 Legal blindness, as defined in USA: Secondary | ICD-10-CM

## 2023-07-05 DIAGNOSIS — J4540 Moderate persistent asthma, uncomplicated: Secondary | ICD-10-CM

## 2023-07-05 DIAGNOSIS — R7303 Prediabetes: Secondary | ICD-10-CM

## 2023-07-05 DIAGNOSIS — Z136 Encounter for screening for cardiovascular disorders: Secondary | ICD-10-CM

## 2023-07-05 DIAGNOSIS — Z1159 Encounter for screening for other viral diseases: Secondary | ICD-10-CM

## 2023-07-05 NOTE — Telephone Encounter (Signed)
7.15.24 no show letter sent

## 2023-07-14 NOTE — Telephone Encounter (Signed)
Noted  

## 2023-07-14 NOTE — Telephone Encounter (Signed)
Same day cancel, 1st missed visit, fee waived

## 2023-07-27 ENCOUNTER — Ambulatory Visit (INDEPENDENT_AMBULATORY_CARE_PROVIDER_SITE_OTHER): Payer: Medicare HMO | Admitting: Nurse Practitioner

## 2023-07-27 ENCOUNTER — Encounter: Payer: Self-pay | Admitting: Nurse Practitioner

## 2023-07-27 ENCOUNTER — Telehealth: Payer: Self-pay

## 2023-07-27 VITALS — BP 116/70 | HR 66 | Temp 98.5°F | Ht 60.0 in | Wt 177.0 lb

## 2023-07-27 DIAGNOSIS — Z0000 Encounter for general adult medical examination without abnormal findings: Secondary | ICD-10-CM

## 2023-07-27 DIAGNOSIS — J4540 Moderate persistent asthma, uncomplicated: Secondary | ICD-10-CM | POA: Diagnosis not present

## 2023-07-27 DIAGNOSIS — Z136 Encounter for screening for cardiovascular disorders: Secondary | ICD-10-CM | POA: Diagnosis not present

## 2023-07-27 DIAGNOSIS — N898 Other specified noninflammatory disorders of vagina: Secondary | ICD-10-CM

## 2023-07-27 DIAGNOSIS — Z113 Encounter for screening for infections with a predominantly sexual mode of transmission: Secondary | ICD-10-CM | POA: Diagnosis not present

## 2023-07-27 DIAGNOSIS — R7303 Prediabetes: Secondary | ICD-10-CM | POA: Diagnosis not present

## 2023-07-27 DIAGNOSIS — E669 Obesity, unspecified: Secondary | ICD-10-CM | POA: Diagnosis not present

## 2023-07-27 LAB — CBC WITH DIFFERENTIAL/PLATELET
Basophils Absolute: 0 10*3/uL (ref 0.0–0.1)
Basophils Relative: 0.8 % (ref 0.0–3.0)
Eosinophils Absolute: 0.2 10*3/uL (ref 0.0–0.7)
Eosinophils Relative: 3.9 % (ref 0.0–5.0)
HCT: 38.4 % (ref 36.0–46.0)
Hemoglobin: 12.1 g/dL (ref 12.0–15.0)
Lymphocytes Relative: 34.5 % (ref 12.0–46.0)
Lymphs Abs: 1.9 10*3/uL (ref 0.7–4.0)
MCHC: 31.6 g/dL (ref 30.0–36.0)
MCV: 87.2 fl (ref 78.0–100.0)
Monocytes Absolute: 0.4 10*3/uL (ref 0.1–1.0)
Monocytes Relative: 7.9 % (ref 3.0–12.0)
Neutro Abs: 2.9 10*3/uL (ref 1.4–7.7)
Neutrophils Relative %: 52.9 % (ref 43.0–77.0)
Platelets: 374 10*3/uL (ref 150.0–400.0)
RBC: 4.4 Mil/uL (ref 3.87–5.11)
RDW: 14.6 % (ref 11.5–15.5)
WBC: 5.5 10*3/uL (ref 4.0–10.5)

## 2023-07-27 LAB — LIPID PANEL
Cholesterol: 157 mg/dL (ref 0–200)
HDL: 43.7 mg/dL (ref >39.00)
LDL Cholesterol: 91 mg/dL (ref 0–99)
NonHDL: 113.31
Total CHOL/HDL Ratio: 4
Triglycerides: 114 mg/dL (ref 0.0–149.0)
VLDL: 22.8 mg/dL (ref 0.0–40.0)

## 2023-07-27 LAB — COMPREHENSIVE METABOLIC PANEL
ALT: 11 U/L (ref 0–35)
AST: 16 U/L (ref 0–37)
Albumin: 4.1 g/dL (ref 3.5–5.2)
Alkaline Phosphatase: 50 U/L (ref 39–117)
BUN: 10 mg/dL (ref 6–23)
CO2: 26 mEq/L (ref 19–32)
Calcium: 9.5 mg/dL (ref 8.4–10.5)
Chloride: 104 mEq/L (ref 96–112)
Creatinine, Ser: 0.65 mg/dL (ref 0.40–1.20)
GFR: 111.29 mL/min (ref >60.00)
Glucose, Bld: 91 mg/dL (ref 70–99)
Potassium: 4.4 mEq/L (ref 3.5–5.1)
Sodium: 140 mEq/L (ref 135–145)
Total Bilirubin: 0.3 mg/dL (ref 0.2–1.2)
Total Protein: 7.5 g/dL (ref 6.0–8.3)

## 2023-07-27 LAB — HEMOGLOBIN A1C: Hgb A1c MFr Bld: 6 % (ref 4.6–6.5)

## 2023-07-27 MED ORDER — NYSTATIN 100000 UNIT/GM EX CREA: 1.0000 | TOPICAL_CREAM | Freq: Two times a day (BID) | CUTANEOUS | 1 refills | Status: DC

## 2023-07-27 NOTE — Telephone Encounter (Signed)
Order put in.

## 2023-07-27 NOTE — Assessment & Plan Note (Signed)
Most recent A1c was 6.1%. Discussed nutrition, exercise. Check A1c today.

## 2023-07-27 NOTE — Assessment & Plan Note (Signed)
Health maintenance reviewed and updated. Discussed nutrition, exercise. Check CMP, CBC today. Follow-up 1 year.   

## 2023-07-27 NOTE — Telephone Encounter (Signed)
Called pt to inform her that not all the labs that were ordered were collected. Order have been placed in future for pt to rtc for lab draw. Pt will call the office when she knows what day and time she is able to return.

## 2023-07-27 NOTE — Addendum Note (Signed)
Addended by: Lindley Magnus L on: 07/27/2023 04:39 PM   Modules accepted: Orders

## 2023-07-27 NOTE — Assessment & Plan Note (Signed)
Chronic, stable. Continue albuterol inhaler as needed. She declines pneumonia vaccine today.

## 2023-07-27 NOTE — Telephone Encounter (Signed)
I called patient after receiving a call from [facility] at [facility]. I made a mistake and printed off the wrong patient and put it on patient's urine cytology vial. I called patient and asked her if she could come back for a urine sample and she told me that she has to come back for lab work sometime and will call the office back and make her appointment after checking with her work.

## 2023-07-27 NOTE — Progress Notes (Signed)
BP 116/70 (BP Location: Left Arm)   Pulse 66   Temp 98.5 F (36.9 C) (Oral)   Ht 5' (1.524 m)   Wt 177 lb (80.3 kg)   LMP 07/18/2023 (Approximate)   SpO2 98%   Breastfeeding No   BMI 34.57 kg/m    Subjective:    Patient ID: Sylvia Thompson, female    DOB: 02-Sep-1986, 37 y.o.   MRN: [id number]  CC: Chief Complaint  Patient presents with   Annual Exam    With lab work-patient is not fasting    HPI: Sylvia Thompson is a 37 y.o. female presenting on 07/27/2023 for comprehensive medical examination. Current medical complaints include: vaginal irritation  She took the 2 diflucan tablets 3 months ago which helped with the vaginal discharge she was having, but not the itching on the outside of her labia. She has not tried anything over the counter. She states that her partner changed soaps in case that was leading to irritation. She denies dysuria and pelvic pain.   She currently lives with: husband, 4 kids Menopausal Symptoms: yes  Depression and Anxiety Screen done today and results listed below:     07/27/2023   10:48 AM 04/02/2023    4:16 PM 03/08/2023    2:26 PM 09/28/2022    2:06 PM 01/28/2022    8:40 AM  Depression screen PHQ 2/9  Decreased Interest 0 0 0 0 0  Down, Depressed, Hopeless 0 0 0 0 0  PHQ - 2 Score 0 0 0 0 0  Altered sleeping 0  0 1   Tired, decreased energy 0  3 1   Change in appetite 0  3 1   Feeling bad or failure about yourself  0  0 0   Trouble concentrating 0  0 0   Moving slowly or fidgety/restless 0  0 0   Suicidal thoughts 0  0 0   PHQ-9 Score 0  6 3       07/27/2023   10:48 AM 03/08/2023    2:27 PM 09/28/2022    2:07 PM 01/28/2022    8:40 AM  GAD 7 : Generalized Anxiety Score  Nervous, Anxious, on Edge 0 0 0 0  Control/stop worrying 0 0 0 0  Worry too much - different things 0 3 0 0  Trouble relaxing 0 3 0 0  Restless 0 0 0 0  Easily annoyed or irritable 0 3 0 0  Afraid - awful might happen 0 0 0 0  Total GAD 7 Score 0 9 0 0    The  patient does not have a history of falls. I did not complete a risk assessment for falls. A plan of care for falls was not documented.   Past Medical History:  Past Medical History:  Diagnosis Date   Anemia    Asthma    Blindness, legal    Cataract    Chlamydia 01/03/2019   GBS (group B Streptococcus carrier), +RV culture, currently pregnant 04/12/2019   Gestational diabetes    2007   Glaucoma    HPV (human papilloma virus) infection    Prosthetic eye globe     Surgical History:  Past Surgical History:  Procedure Laterality Date   DILATION AND CURETTAGE OF UTERUS     EYE SURGERY     prosthetic right eye    Medications:  Current Outpatient Medications on File Prior to Visit  Medication Sig   albuterol (PROAIR HFA) 108 (90 Base)  MCG/ACT inhaler Inhale 2 puffs into the lungs every 6 (six) hours as needed for wheezing or shortness of breath.   albuterol (PROVENTIL) (2.5 MG/3ML) 0.083% nebulizer solution Take 3 mLs (2.5 mg total) by nebulization every 6 (six) hours as needed for wheezing or shortness of breath. (Patient not taking: Reported on 04/05/2023)   Budesonide 90 MCG/ACT inhaler Inhale 1 puff into the lungs 2 (two) times daily. (Patient not taking: Reported on 04/02/2023)   fluticasone (FLONASE) 50 MCG/ACT nasal spray Place 2 sprays into both nostrils daily. (Patient not taking: Reported on 04/02/2023)   No current facility-administered medications on file prior to visit.    Allergies:  Allergies  Allergen Reactions   Timolol Shortness Of Breath   Latex Rash    Social History:  Social History   Socioeconomic History   Marital status: Married    Spouse name: Not on file   Number of children: Not on file   Years of education: Not on file   Highest education level: Not on file  Occupational History   Not on file  Tobacco Use   Smoking status: Never   Smokeless tobacco: Never  Vaping Use   Vaping status: Never Used  Substance and Sexual Activity   Alcohol use:  Not Currently   Drug use: No   Sexual activity: Yes    Partners: Male    Birth control/protection: None  Other Topics Concern   Not on file  Social History Narrative   Not on file   Social Determinants of Health   Financial Resource Strain: Low Risk  (04/02/2023)   Overall Financial Resource Strain (CARDIA)    Difficulty of Paying Living Expenses: Not hard at all  Food Insecurity: No Food Insecurity (04/02/2023)   Hunger Vital Sign    Worried About Running Out of Food in the Last Year: Never true    Ran Out of Food in the Last Year: Never true  Transportation Needs: No Transportation Needs (04/02/2023)   PRAPARE - Administrator, Civil Service (Medical): No    Lack of Transportation (Non-Medical): No  Physical Activity: Inactive (04/02/2023)   Exercise Vital Sign    Days of Exercise per Week: 0 days    Minutes of Exercise per Session: 0 min  Stress: No Stress Concern Present (04/02/2023)   [employer] of Occupational Health - Occupational Stress Questionnaire    Feeling of Stress : Not at all  Social Connections: Not on file  Intimate Partner Violence: Not At Risk (05/02/2019)   Humiliation, Afraid, Rape, and Kick questionnaire    Fear of Current or Ex-Partner: No    Emotionally Abused: No    Physically Abused: No    Sexually Abused: No   Social History   Tobacco Use  Smoking Status Never  Smokeless Tobacco Never   Social History   Substance and Sexual Activity  Alcohol Use Not Currently    Family History:  Family History  Problem Relation Age of Onset   Diabetes Mother    Arthritis Mother     Past medical history, surgical history, medications, allergies, family history and social history reviewed with patient today and changes made to appropriate areas of the chart.   Review of Systems  Constitutional: Negative.   HENT: Negative.    Eyes: Negative.   Respiratory: Negative.    Cardiovascular: Negative.   Gastrointestinal: Negative.    Genitourinary:  Negative for dysuria, frequency and urgency.       Vaginal irritation  Musculoskeletal: Negative.   Skin: Negative.   Neurological: Negative.   Psychiatric/Behavioral: Negative.     All other ROS negative except what is listed above and in the HPI.      Objective:    BP 116/70 (BP Location: Left Arm)   Pulse 66   Temp 98.5 F (36.9 C) (Oral)   Ht 5' (1.524 m)   Wt 177 lb (80.3 kg)   LMP 07/18/2023 (Approximate)   SpO2 98%   Breastfeeding No   BMI 34.57 kg/m   Wt Readings from Last 3 Encounters:  07/27/23 177 lb (80.3 kg)  04/05/23 174 lb 3.2 oz (79 kg)  04/02/23 170 lb (77.1 kg)    Physical Exam Vitals and nursing note reviewed.  Constitutional:      General: She is not in acute distress.    Appearance: Normal appearance.  HENT:     Head: Normocephalic and atraumatic.     Right Ear: Tympanic membrane, ear canal and external ear normal.     Left Ear: Tympanic membrane, ear canal and external ear normal.  Eyes:     Conjunctiva/sclera: Conjunctivae normal.  Cardiovascular:     Rate and Rhythm: Normal rate and regular rhythm.     Pulses: Normal pulses.     Heart sounds: Normal heart sounds.  Pulmonary:     Effort: Pulmonary effort is normal.     Breath sounds: Normal breath sounds.  Abdominal:     Palpations: Abdomen is soft.     Tenderness: There is no abdominal tenderness.  Musculoskeletal:        General: Normal range of motion.     Cervical back: Normal range of motion and neck supple.     Right lower leg: No edema.     Left lower leg: No edema.  Lymphadenopathy:     Cervical: No cervical adenopathy.  Skin:    General: Skin is warm and dry.  Neurological:     General: No focal deficit present.     Mental Status: She is alert and oriented to person, place, and time.     Cranial Nerves: No cranial nerve deficit.     Coordination: Coordination normal.     Gait: Gait normal.  Psychiatric:        Mood and Affect: Mood normal.         Behavior: Behavior normal.        Thought Content: Thought content normal.        Judgment: Judgment normal.     Results for orders placed or performed in visit on 04/05/23  POCT urine pregnancy  Result Value Ref Range   Preg Test, Ur Negative Negative  POCT urinalysis dipstick  Result Value Ref Range   Color, UA     Clarity, UA     Glucose, UA Negative Negative   Bilirubin, UA Negative    Ketones, UA Negative    Spec Grav, UA 1.015 1.010 - 1.025   Blood, UA 3+    pH, UA 6.0 5.0 - 8.0   Protein, UA Negative Negative   Urobilinogen, UA 0.2 0.2 or 1.0 E.U./dL   Nitrite, UA Negative    Leukocytes, UA Negative Negative   Appearance     Odor    Cervicovaginal ancillary only  Result Value Ref Range   Trichomonas Negative    Bacterial Vaginitis (gardnerella) Negative    Candida Vaginitis Positive (A)    Candida Glabrata Negative    Comment      Normal  Reference Range Bacterial Vaginosis - Negative   Comment Normal Reference Range Candida Species - Negative    Comment Normal Reference Range Candida Galbrata - Negative    Comment Normal Reference Range Trichomonas - Negative       Assessment & Plan:   Problem List Items Addressed This Visit       Respiratory   Moderate persistent asthma    Chronic, stable. Continue albuterol inhaler as needed. She declines pneumonia vaccine today.         Other   Prediabetes    Most recent A1c was 6.1%. Discussed nutrition, exercise. Check A1c today.       Relevant Orders   Hemoglobin A1c   Routine general medical examination at a health care facility - Primary    Health maintenance reviewed and updated. Discussed nutrition, exercise. Check CMP, CBC today. Follow-up 1 year.        Relevant Orders   CBC with Differential/Platelet   Comprehensive metabolic panel   Obesity (BMI 62-13.9)    BMI 34.5. Discussed nutrition, exercise.       Other Visit Diagnoses     Screen for STD (sexually transmitted disease)       Screen  STDs per request.   Relevant Orders   Urine cytology ancillary only   HIV Antibody (routine testing w rflx)   RPR   HSV(herpes simplex vrs) 1+2 ab-IgG   Hepatitis C antibody   Screening for cardiovascular condition       Screen lipid panel today   Relevant Orders   Lipid panel   Vaginal irritation       Start nystatin cream twice a day as needed for itching and irritation.        Follow up plan: Return in about 1 year (around 07/26/2024) for CPE.   LABORATORY TESTING:  - Pap smear: up to date  IMMUNIZATIONS:   - Tdap: Tetanus vaccination status reviewed: last tetanus booster within 10 years. - Influenza: Postponed to flu season - Pneumovax: Not applicable - Prevnar: Refused - HPV: Not applicable - Zostavax vaccine: Not applicable  SCREENING: -Mammogram: Not applicable  - Colonoscopy: Not applicable  - Bone Density: Not applicable   PATIENT COUNSELING:   Advised to take 1 mg of folate supplement per day if capable of pregnancy.   Sexuality: Discussed sexually transmitted diseases, partner selection, use of condoms, avoidance of unintended pregnancy  and contraceptive alternatives.   Advised to avoid cigarette smoking.  I discussed with the patient that most people either abstain from alcohol or drink within safe limits (<=14/week and <=4 drinks/occasion for males, <=7/weeks and <= 3 drinks/occasion for females) and that the risk for alcohol disorders and other health effects rises proportionally with the number of drinks per week and how often a drinker exceeds daily limits.  Discussed cessation/primary prevention of drug use and availability of treatment for abuse.   Diet: Encouraged to adjust caloric intake to maintain  or achieve ideal body weight, to reduce intake of dietary saturated fat and total fat, to limit sodium intake by avoiding high sodium foods and not adding table salt, and to maintain adequate dietary potassium and calcium preferably from fresh fruits,  vegetables, and low-fat dairy products.    stressed the importance of regular exercise  Injury prevention: Discussed safety belts, safety helmets, smoke detector, smoking near bedding or upholstery.   Dental health: Discussed importance of regular tooth brushing, flossing, and dental visits.    NEXT PREVENTATIVE PHYSICAL DUE IN 1 YEAR.  Return in about 1 year (around 07/26/2024) for CPE.  Rigby Swamy A Ginger Leeth

## 2023-07-27 NOTE — Assessment & Plan Note (Signed)
BMI 34.5. Discussed nutrition, exercise.

## 2023-07-27 NOTE — Patient Instructions (Signed)
It was great to see you!  We are checking your labs today and will let you know the results via mychart/phone.   Start using the cream once to twice a day to help with irritation.   Let's follow-up in 1 year, sooner if you have concerns.  If a referral was placed today, you will be contacted for an appointment. Please note that routine referrals can sometimes take up to 3-4 weeks to process. Please call our office if you haven't heard anything after this time frame.  Take care,  Rodman Pickle, NP

## 2023-11-05 ENCOUNTER — Other Ambulatory Visit (INDEPENDENT_AMBULATORY_CARE_PROVIDER_SITE_OTHER): Payer: Medicare HMO

## 2023-11-05 ENCOUNTER — Other Ambulatory Visit (HOSPITAL_COMMUNITY)
Admission: RE | Admit: 2023-11-05 | Discharge: 2023-11-05 | Disposition: A | Discharge status: Home / Self Care | Payer: Medicare HMO | Source: Ambulatory Visit | Attending: Nurse Practitioner | Admitting: Nurse Practitioner

## 2023-11-05 DIAGNOSIS — Z113 Encounter for screening for infections with a predominantly sexual mode of transmission: Secondary | ICD-10-CM

## 2023-11-08 LAB — URINE CYTOLOGY ANCILLARY ONLY
Chlamydia: NEGATIVE
Comment: NEGATIVE
Comment: NORMAL
Neisseria Gonorrhea: NEGATIVE

## 2023-11-09 LAB — HSV(HERPES SIMPLEX VRS) I + II AB-IGG
HSV 1 IGG,TYPE SPECIFIC AB: <0.9 {index}
HSV 2 IGG,TYPE SPECIFIC AB: <0.9 {index}

## 2023-11-09 LAB — HIV ANTIBODY (ROUTINE TESTING W REFLEX): HIV 1&2 Ab, 4th Generation: NONREACTIVE

## 2023-11-09 LAB — RPR: RPR Ser Ql: NONREACTIVE

## 2023-11-09 LAB — HEPATITIS C ANTIBODY: Hepatitis C Ab: NONREACTIVE

## 2023-12-14 ENCOUNTER — Telehealth: Payer: Self-pay

## 2023-12-14 NOTE — Telephone Encounter (Signed)
Patient was scheduled with you on Thursday 12/26 at 11am for rash. Patient inquired if she should come fasting? Please advise.

## 2023-12-14 NOTE — Telephone Encounter (Signed)
Copied from CRM [id number]. Topic: Appointments - Appointment Scheduling >> Dec 14, 2023  9:46 AM Tiffany H wrote: Patient/patient representative is calling to schedule an appointment. Patient had irritation that started off as a non-itchy patch that has now grown into a black rash that intermittently itches. On arm, thighs and legs.The worst discoloration is on her arm, but rash has spread to legs and thighs, too. Please assist. Patient would like to have a urinalysis done, too. Please contact patient if provider would like patient to fast prior to visit.

## 2023-12-16 ENCOUNTER — Encounter: Payer: Self-pay | Admitting: Family Medicine

## 2023-12-16 ENCOUNTER — Ambulatory Visit (INDEPENDENT_AMBULATORY_CARE_PROVIDER_SITE_OTHER): Payer: Medicare HMO | Admitting: Family Medicine

## 2023-12-16 VITALS — BP 120/84 | Ht 60.0 in

## 2023-12-16 DIAGNOSIS — L2082 Flexural eczema: Secondary | ICD-10-CM | POA: Diagnosis not present

## 2023-12-16 DIAGNOSIS — R300 Dysuria: Secondary | ICD-10-CM | POA: Diagnosis not present

## 2023-12-16 LAB — POCT URINALYSIS DIPSTICK
Bilirubin, UA: NEGATIVE
Glucose, UA: NEGATIVE
Ketones, UA: NEGATIVE
Nitrite, UA: POSITIVE
Protein, UA: NEGATIVE
Spec Grav, UA: 1.015 (ref 1.010–1.025)
Urobilinogen, UA: 0.2 U/dL
pH, UA: 6 (ref 5.0–8.0)

## 2023-12-16 MED ORDER — TRIAMCINOLONE ACETONIDE 0.1 % EX CREA: TOPICAL_CREAM | CUTANEOUS | 0 refills | Status: AC

## 2023-12-16 MED ORDER — NITROFURANTOIN MONOHYD MACRO 100 MG PO CAPS: 100.0000 mg | ORAL_CAPSULE | Freq: Two times a day (BID) | ORAL | 0 refills | Status: AC

## 2023-12-16 NOTE — Progress Notes (Signed)
Established Patient Office Visit   Subjective:  Patient ID: Sylvia Thompson, female    DOB: [birth year]  Age: 37 y.o. MRN: [id number]  Chief Complaint  Patient presents with   Rash    C/o having a rash on arm and legs. Also c/o having dark urine, dysuria/frequency on/off x 2 weeks.       Rash Pertinent negatives include no shortness of breath.   Encounter Diagnoses  Name Primary?   Dysuria Yes   Flexural eczema    Presents with a 2-week history of urinary frequency with mild dysuria.  Denies nausea or vomiting, fever chills back pain.  Ongoing rash on her left arm and upper legs.  It is pruritic.  Seems to be resolving.  Thinks it may have come up after eating peanuts.  She did notice a tingling in her mouth and the back of her throat after eating peanuts.  She denied any swelling in the throat or difficulty breathing.   Review of Systems  Constitutional: Negative.   HENT: Negative.    Eyes:  Negative for blurred vision, discharge and redness.  Respiratory: Negative.  Negative for shortness of breath and wheezing.   Cardiovascular: Negative.   Gastrointestinal:  Negative for abdominal pain.  Genitourinary:  Positive for dysuria, frequency and urgency.  Musculoskeletal: Negative.  Negative for myalgias.  Skin:  Positive for itching and rash.  Neurological:  Negative for tingling, loss of consciousness and weakness.  Endo/Heme/Allergies:  Negative for polydipsia.     Current Outpatient Medications:    albuterol (PROAIR HFA) 108 (90 Base) MCG/ACT inhaler, Inhale 2 puffs into the lungs every 6 (six) hours as needed for wheezing or shortness of breath., Disp: 18 g, Rfl: 3   fluticasone (FLONASE) 50 MCG/ACT nasal spray, Place 2 sprays into both nostrils daily., Disp: 16 g, Rfl: 6   nitrofurantoin, macrocrystal-monohydrate, (MACROBID) 100 MG capsule, Take 1 capsule (100 mg total) by mouth 2 (two) times daily for 7 days., Disp: 14 capsule, Rfl: 0   triamcinolone cream (KENALOG)  0.1 %, Apply a thin coat to rash on left forearm twice daily, Disp: 30 g, Rfl: 0   Objective:     BP 120/84   Ht 5' (1.524 m)   BMI 34.57 kg/m    Physical Exam Constitutional:      General: She is not in acute distress.    Appearance: Normal appearance. She is not ill-appearing, toxic-appearing or diaphoretic.  HENT:     Head: Normocephalic and atraumatic.     Right Ear: External ear normal.     Left Ear: External ear normal.     Mouth/Throat:     Mouth: Mucous membranes are moist.     Pharynx: Oropharynx is clear. No oropharyngeal exudate or posterior oropharyngeal erythema.  Eyes:     General: No scleral icterus.       Right eye: No discharge.        Left eye: No discharge.     Conjunctiva/sclera: Conjunctivae normal.  Cardiovascular:     Rate and Rhythm: Normal rate and regular rhythm.  Pulmonary:     Effort: Pulmonary effort is normal. No respiratory distress.     Breath sounds: Normal breath sounds. No wheezing, rhonchi or rales.  Abdominal:     General: Bowel sounds are normal.     Tenderness: There is no right CVA tenderness or left CVA tenderness.  Musculoskeletal:     Cervical back: No rigidity or tenderness.  Skin:    General:  Skin is warm and dry.     Findings: No erythema.  Neurological:     Mental Status: She is alert and oriented to person, place, and time.  Psychiatric:        Mood and Affect: Mood normal.        Behavior: Behavior normal.      Results for orders placed or performed in visit on 12/16/23  POCT Urinalysis Dipstick  Result Value Ref Range   Color, UA yellow    Clarity, UA clear    Glucose, UA Negative Negative   Bilirubin, UA neg    Ketones, UA neg    Spec Grav, UA 1.015 1.010 - 1.025   Blood, UA 3+    pH, UA 6.0 5.0 - 8.0   Protein, UA Negative Negative   Urobilinogen, UA 0.2 0.2 or 1.0 E.U./dL   Nitrite, UA positive    Leukocytes, UA Small (1+) (A) Negative   Appearance     Odor        The ASCVD Risk score (Arnett  DK, et al., 2019) failed to calculate for the following reasons:   The 2019 ASCVD risk score is only valid for ages 69 to 50    Assessment & Plan:   Dysuria -     POCT urinalysis dipstick -     Urine Culture -     Nitrofurantoin Monohyd Macro; Take 1 capsule (100 mg total) by mouth 2 (two) times daily for 7 days.  Dispense: 14 capsule; Refill: 0  Flexural eczema -     Triamcinolone Acetonide; Apply a thin coat to rash on left forearm twice daily  Dispense: 30 g; Refill: 0    Return Advised to schedule follow-up with Lauren.    Mliss Sax, MD

## 2023-12-18 LAB — URINE CULTURE
MICRO NUMBER:: 15890700
SPECIMEN QUALITY:: ADEQUATE

## 2024-01-04 ENCOUNTER — Encounter (HOSPITAL_BASED_OUTPATIENT_CLINIC_OR_DEPARTMENT_OTHER): Payer: Self-pay

## 2024-01-04 ENCOUNTER — Other Ambulatory Visit: Payer: Self-pay

## 2024-01-04 ENCOUNTER — Emergency Department (HOSPITAL_BASED_OUTPATIENT_CLINIC_OR_DEPARTMENT_OTHER)
Admission: EM | Admit: 2024-01-04 | Discharge: 2024-01-04 | Disposition: A | Discharge status: Home / Self Care | Payer: Medicare HMO | Attending: Emergency Medicine | Admitting: Emergency Medicine

## 2024-01-04 DIAGNOSIS — L249 Irritant contact dermatitis, unspecified cause: Secondary | ICD-10-CM | POA: Diagnosis not present

## 2024-01-04 DIAGNOSIS — J45909 Unspecified asthma, uncomplicated: Secondary | ICD-10-CM | POA: Diagnosis not present

## 2024-01-04 DIAGNOSIS — Z91040 Latex allergy status: Secondary | ICD-10-CM | POA: Diagnosis not present

## 2024-01-04 DIAGNOSIS — R21 Rash and other nonspecific skin eruption: Secondary | ICD-10-CM | POA: Diagnosis present

## 2024-01-04 NOTE — Discharge Instructions (Signed)
 You were seen in the emergency department for your rash.  It is likely a contact dermatitis or an eczema type of rash and you may be having irritation related to one of the oils that you use or something in your soaps or cleaning products.  You should try to use on scented products and should continue to use the steroid cream as prescribed by your primary doctor.  You can take Benadryl  as needed for itching.  You can follow-up with dermatology as needed if the rash is not improved with the steroids.  You should return to the emergency department if you have redness spreading from the rashes, pus draining, fevers, if you develop tongue or throat swelling, shortness of breath or any other new or concerning symptoms.

## 2024-01-04 NOTE — ED Triage Notes (Signed)
 Pt has had rash on left arm x 2 weeks and was prescribed cream for eczema, but noticed the rash on palms of her hands x 3-4 days. Area is itchy and painful.

## 2024-01-04 NOTE — ED Provider Notes (Signed)
 [place] [facility] DE[facility]MENT AT [facility] Provider Note   CSN: [id number] Arrival date & time: 01/04/24  9377     History  Chief Complaint  Patient presents with   Rash    Sylvia Thompson is a 38 y.o. female.  Patient is a 38 year old female with a past medical history of asthma presenting to the emergency department for rash.  She states for about the last 2 weeks she has had a rash in her left elbow crease and is started to spread to both her palms as well as her proximal thighs.  She states that it is itchy.  She states that she initially saw her primary care doctor for this and was started on a steroid cream but she only started using this yesterday with minimal relief.  She states that she was also placed on an antibiotic for UTI around the same time but has not been taking these pills as she does not like taking medicine.  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  She states that she stopped using this after the rash initially showed up.  She states that she was also recently tested for HIV and syphilis and has low concern for any STD exposures.  Denies any recent camping trip or tick exposures.  The history is provided by the patient.  Rash      Home Medications Prior to Admission medications   Medication Sig Start Date End Date Taking? Authorizing Provider  albuterol  (PROAIR  HFA) 108 (90 Base) MCG/ACT inhaler Inhale 2 puffs into the lungs every 6 (six) hours as needed for wheezing or shortness of breath. 07/27/22   Fleming, Zelda W, NP  fluticasone  (FLONASE ) 50 MCG/ACT nasal spray Place 2 sprays into both nostrils daily. 03/08/23   McElwee, Tinnie LABOR, NP  triamcinolone  cream (KENALOG ) 0.1 % Apply a thin coat to rash on left forearm twice daily 12/16/23   Berneta Elsie Sayre, MD      Allergies    Timolol and Latex    Review of Systems   Review of Systems  Skin:   Positive for rash.    Physical Exam Updated Vital Signs BP 130/72 (BP Location: Right Arm)   Pulse 75   Temp 98.2 F (36.8 C)   Resp 16   LMP 12/27/2023 (Exact Date)   SpO2 100%  Physical Exam Vitals and nursing note reviewed.  Constitutional:      General: She is not in acute distress.    Appearance: Normal appearance.  HENT:     Head: Normocephalic.     Nose: Nose normal.     Mouth/Throat:     Mouth: Mucous membranes are moist.  Eyes:     Extraocular Movements: Extraocular movements intact.  Pulmonary:     Effort: Pulmonary effort is normal.  Musculoskeletal:        General: Normal range of motion.     Cervical back: Normal range of motion.  Skin:    General: Skin is warm and dry.     Comments: Dry scaly rash in L flexural surface  Dry scaly rash on bilateral palms Dry scaly rash on tops of bilateral thighs  Neurological:     Mental Status: She is alert and oriented to person, place, and time.  Psychiatric:        Mood and Affect: Mood normal.  Behavior: Behavior normal.     ED Results / Procedures / Treatments   Labs (all labs ordered are listed, but only abnormal results are displayed) Labs Reviewed - No data to display  EKG None  Radiology No results found.  Procedures Procedures    Medications Ordered in ED Medications - No data to display  ED Course/ Medical Decision Making/ A&P                                 Medical Decision Making This patient presents to the ED with chief complaint(s) of rash with pertinent past medical history of asthma which further complicates the presenting complaint. The complaint involves an extensive differential diagnosis and also carries with it a high risk of complications and morbidity.    The differential diagnosis includes eczema, contact dermatitis, patient reports recent negative syphilis testing making a syphilis rash unlikely, no urticaria does not appear consistent with allergic type reaction, no  signs of anaphylaxis on exam, no erythema or warmth consistent with cellulitis  Additional history obtained: Additional history obtained from N/A Records reviewed Primary Care Documents  ED Course and Reassessment: On patient's arrival she is hemodynamically stable in no acute distress.  Rash appears consistent with eczema or contact dermatitis.  She was recommended to continue taking the triamcinolone  as prescribed and Benadryl  as needed, to avoid any new or scented cleaning products to help prevent reexposures and recommended close primary care follow-up and was given dermatology if needed.  She was given strict return precautions.  Independent labs interpretation:  N/A  Independent visualization of imaging: -N/A  Consultation: - Consulted or discussed management/test interpretation w/ external professional: N/A  Consideration for admission or further workup: Patient has no emergent conditions requiring admission or further work-up at this time and is stable for discharge home with primary care follow-up  Social Determinants of health: N/A            Final Clinical Impression(s) / ED Diagnoses Final diagnoses:  Irritant contact dermatitis, unspecified trigger    Rx / DC Orders ED Discharge Orders     None         Kingsley, Jawon Dipiero K, DO 01/04/24 [phone]

## 2024-01-06 ENCOUNTER — Encounter (HOSPITAL_COMMUNITY): Payer: Self-pay

## 2024-01-06 ENCOUNTER — Ambulatory Visit (HOSPITAL_COMMUNITY)
Admission: EM | Admit: 2024-01-06 | Discharge: 2024-01-06 | Disposition: A | Discharge status: Home / Self Care | Payer: Medicare HMO

## 2024-01-06 ENCOUNTER — Ambulatory Visit: Payer: Self-pay | Admitting: Nurse Practitioner

## 2024-01-06 DIAGNOSIS — L301 Dyshidrosis [pompholyx]: Secondary | ICD-10-CM

## 2024-01-06 NOTE — ED Provider Notes (Signed)
[facility]    CSN: [id number] Arrival date & time: 01/06/24  1335    HISTORY   Chief Complaint  Patient presents with   hand swelling   HPI Sylvia Thompson is a pleasant, 38 y.o. female who presents to urgent care today. Patient complains of a 1 week history of rash on palms of both hands.  Patient states she went to the ED 2 days ago and was treated for eczema.  Patient states she has been using the triamcinolone cream prescribed for her but is now having swelling.  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  Patient states she made an appointment with dermatologist but they cannot see her until March.  The history is provided by the patient.   Past Medical History:  Diagnosis Date   Anemia    Asthma    Blindness, legal    Cataract    Chlamydia 01/03/2019   GBS (group B Streptococcus carrier), +RV culture, currently pregnant 04/12/2019   Gestational diabetes    2007   Glaucoma    HPV (human papilloma virus) infection    Prosthetic eye globe    Patient Active Problem List   Diagnosis Date Noted   Dysuria 12/16/2023   Flexural eczema 12/16/2023   Routine general medical examination at a health care facility 07/27/2023   Obesity (BMI 30-39.9) 07/27/2023   Moderate persistent asthma 09/28/2022   Prediabetes 09/28/2022   History of gestational diabetes 11/02/2018   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 Authorizing Provider  albuterol (PROAIR HFA) 108 (90 Base) MCG/ACT inhaler Inhale 2 puffs into the lungs every 6  (six) hours as needed for wheezing or shortness of breath. 07/27/22   Claiborne Rigg, NP  fluticasone (FLONASE) 50 MCG/ACT nasal spray Place 2 sprays into both nostrils daily. 03/08/23   McElwee, Jake Church, NP  triamcinolone cream (KENALOG) 0.1 % Apply a thin coat to rash on left forearm twice daily 12/16/23   Mliss Sax, MD    Family History Family History  Problem Relation Age of Onset   Diabetes Mother    Arthritis Mother    Social History Social History   Tobacco Use   Smoking status: Never   Smokeless tobacco: Never  Vaping Use   Vaping status: Never Used  Substance Use Topics   Alcohol use: Not Currently   Drug use: No   Allergies   Timolol and Latex  Review of Systems Review of Systems Pertinent findings revealed after performing a 14 point review of systems has been noted in the history of present illness.  Physical Exam Vital Signs BP 123/82 (BP Location: Left Arm)   Pulse 72   Temp 98.4 F (36.9 C) (Oral)  Resp 18   LMP 12/27/2023 (Exact Date)   SpO2 97%   No data found.  Physical Exam Vitals and nursing note reviewed.  Constitutional:      General: She is not in acute distress.    Appearance: Normal appearance.  HENT:     Head: Normocephalic and atraumatic.  Eyes:     Pupils: Pupils are equal, round, and reactive to light.  Cardiovascular:     Rate and Rhythm: Normal rate and regular rhythm.  Pulmonary:     Effort: Pulmonary effort is normal.     Breath sounds: Normal breath sounds.  Musculoskeletal:        General: Normal range of motion.     Cervical back: Normal range of motion and neck supple.  Skin:    General: Skin is warm and dry.     Comments: Dyshidrotic eczema appreciated on palms of both hands.  No swelling, erythema, excoriation, blistering, scale, drainage or tenderness to palpation appreciated.  Neurological:     General: No focal deficit present.     Mental Status: She is alert and oriented to person, place, and  time. Mental status is at baseline.  Psychiatric:        Mood and Affect: Mood normal.        Behavior: Behavior normal.        Thought Content: Thought content normal.        Judgment: Judgment normal.     Visual Acuity Right Eye Distance:   Left Eye Distance:   Bilateral Distance:    Right Eye Near:   Left Eye Near:    Bilateral Near:     UC Couse / Diagnostics / Procedures:     Radiology No results found.  Procedures Procedures (including critical care time) EKG  Pending results:  Labs Reviewed - No data to display  Medications Ordered in UC: Medications - No data to display  UC Diagnoses / Final Clinical Impressions(s)   I have reviewed the triage vital signs and the nursing notes.  Pertinent labs & imaging results that were available during my care of the patient were reviewed by me and considered in my medical decision making (see chart for details).    Final diagnoses:  Dyshidrotic eczema   Patient politely refuses offer for steroid injection and oral steroids.  Patient states she does not want to be treated until she has an official diagnosis.  Unclear why she came to urgent care today.  Patient advised to use moisture barrier cream liberally until her appointment with a dermatologist in March.  Conservative care recommended.  Return precautions advised.  Please see discharge instructions below for details of plan of care as provided to patient. ED Prescriptions   None    PDMP not reviewed this encounter.  Pending results:  Labs Reviewed - No data to display    Discharge Instructions      I enclosed admission about dyshidrotic eczema therapy find helpful.  If the CeraVe cream that your daughter uses is not working well enough, consider purchasing Eucerin original healing cream in the white jar with a red lid.  I recommend that you apply at least twice daily and moisturize in between every handwashing.  Thank you for visiting [facility] today.    Disposition Upon Discharge:  Condition: stable for discharge home  Patient presented with an acute illness with associated systemic symptoms and significant discomfort requiring urgent management. In my opinion, this is a condition that  a prudent lay person (someone who possesses an average knowledge of health and medicine) may potentially expect to result in complications if not addressed urgently such as respiratory distress, impairment of bodily function or dysfunction of bodily organs.   Routine symptom specific, illness specific and/or disease specific instructions were discussed with the patient and/or caregiver at length.   As such, the patient has been evaluated and assessed, work-up was performed and treatment was provided in alignment with urgent care protocols and evidence based medicine.  Patient/parent/caregiver has been advised that the patient may require follow up for further testing and treatment if the symptoms continue in spite of treatment, as clinically indicated and appropriate.  Patient/parent/caregiver has been advised to return to the [facility] or PCP if no better; to PCP or the Emergency Department if new signs and symptoms develop, or if the current signs or symptoms continue to change or worsen for further workup, evaluation and treatment as clinically indicated and appropriate  The patient will follow up with their current PCP if and as advised. If the patient does not currently have a PCP we will assist them in obtaining one.   The patient may need specialty follow up if the symptoms continue, in spite of conservative treatment and management, for further workup, evaluation, consultation and treatment as clinically indicated and appropriate.  Patient/parent/caregiver verbalized understanding and agreement of plan as discussed.  All questions were addressed during visit.  Please see discharge instructions below for further details of plan.  This office note  has been dictated using Teaching laboratory technician.  Unfortunately, this method of dictation can sometimes lead to typographical or grammatical errors.  I apologize for your inconvenience in advance if this occurs.  Please do not hesitate to reach out to me if clarification is needed.      Theadora Rama Scales, PA-C 01/06/24 1534

## 2024-01-06 NOTE — ED Triage Notes (Signed)
Pt c/o rash to palms of both hands for over a week and tx'd for eczema in the ED 2 days ago. States now having swelling. States can't work like this and can't see dermatology until march.

## 2024-01-06 NOTE — Discharge Instructions (Signed)
I enclosed admission about dyshidrotic eczema therapy find helpful.  If the CeraVe cream that your daughter uses is not working well enough, consider purchasing Eucerin original healing cream in the white jar with a red lid.  I recommend that you apply at least twice daily and moisturize in between every handwashing.  Thank you for visiting [facility] today.

## 2024-01-06 NOTE — Telephone Encounter (Signed)
Copied from CRM [id number]. Topic: Clinical - Red Word Triage >> Jan 06, 2024 12:12 PM Samuel Jester B wrote: [facility] that prompted transfer to Nurse Triage: Pt stated that her hand started burning and itching about 3 or 4 days ago, went to the ER and they stated that she has eczema and the day after her hand is now swollen along with her fingers, and her hands and fingers are now burning.   Chief Complaint:  Both hands swelling and painful  diagnosed with Eczema   4 days ago. Patient denies any possibility of allergic reaction Symptoms:  Burning and Swelling in hands with both hands  Frequency:  Started on 3-4 days ago and was seen in ER on 01/04/24 then it got better then started back up. Pertinent Negatives: Patient denies swelling above  forearm. Disposition: [] ED /[x] Urgent Care (no appt availability in office) / [] Appointment(In office/virtual)/ []  [facility]/ [] Home Care/ [] Refused Recommended Disposition /[] [facility]/ []  Follow-up with PCP Additional Notes:  Patient states she will go to Urgent Care today- Patient would like for her provider to see her. Advised no appointments available.  Encouraged to call back to schedule follow up  once seen in Urgent care  Reason for Disposition  [1] Red area or streak [2] large (> 2 in. or 5 cm)  Answer Assessment - Initial Assessment Questions 1. ONSET: "When did the swelling start?" (e.g., minutes, hours, days)      Started 3-4 days 2. LOCATION: "What part of the hand is swollen?"  "Are both hands swollen or just one hand?"     Both hands worst on the right hand 3. SEVERITY: "How bad is the swelling?" (e.g., localized; mild, moderate, severe)    - MODERATE: fingers and hand are swollen   - SEVERE: swelling of entire hand and up into forearm      Moderate 4. REDNESS: "Does the swelling look red or infected?"     Redness  blister  5. PAIN: "Is the swelling painful to touch?" If Yes, ask: "How painful is it?"   (Scale 1-10;  mild, moderate or severe)      7  out on scale  6. FEVER: "Do you have a fever?" If Yes, ask: "What is it, how was it measured, and when did it start?"      Denies 7. CAUSE: "What do you think is causing the hand swelling?" (e.g., heat, insect bite, pregnancy, recent injury)       Allergic reaction  don't think  it is Eczema - 8. MEDICAL HISTORY: "Do you have a history of heart failure, kidney disease, liver failure, or cancer?"      Denies 9. RECURRENT SYMPTOM: "Have you had hand swelling before?" If Yes, ask: "When was the last time?" "What happened that time?"      Denies  10. OTHER SYMPTOMS: "Do you have any other symptoms?" (e.g., blurred vision, difficulty breathing, headache)       Denies 11. PREGNANCY: "Is there any chance you are pregnant?" "When was your last menstrual period?"        Denies.  Last Sunday  Protocols used: Hand Swelling-A-AH

## 2024-01-06 NOTE — Telephone Encounter (Signed)
I called and spoke with patient and advised her that she will need to go to urgent care to be seen because Lauren does not have an available appointments. She said that she currently waiting at an urgent care on [address] to be seen.

## 2024-01-07 ENCOUNTER — Telehealth (HOSPITAL_COMMUNITY): Payer: Self-pay | Admitting: *Deleted

## 2024-01-07 NOTE — Telephone Encounter (Signed)
Work note extended for thr total of 3 days. She can return to work on 01/09/2024. Sent via [employer]

## 2024-01-28 ENCOUNTER — Telehealth: Payer: Self-pay

## 2024-01-28 NOTE — Telephone Encounter (Signed)
 Copied from CRM [id number]. Topic: Referral - Request for Referral >> Jan 28, 2024  9:50 AM Rosina BIRCH wrote: Did the patient discuss referral with their provider in the last year? No (If No - schedule appointment) (If Yes - send message)  Appointment offered? No  Type of order/referral and detailed reason for visit: dermatology  Preference of office, provider, location: [place]  If referral order, have you been seen by this specialty before? No (If Yes, this issue or another issue? When? Where?  Can we respond through MyChart? Yes  Forwarding message above. LOV 12/16/2023 dermatology referral request. Patient also went to ED for concern. (Eczema flare up)

## 2024-01-31 NOTE — Telephone Encounter (Signed)
 Spoke to patient and she was referred to dermatology at [facility] while in the ED; patient appointment is not until June in which is to far away; she is requesting something sooner, different location (new referral) request .

## 2024-02-01 NOTE — Telephone Encounter (Signed)
Called patient and relied message below on VM; per patient request due to work.

## 2024-02-02 ENCOUNTER — Ambulatory Visit: Payer: Self-pay | Admitting: Nurse Practitioner

## 2024-02-02 NOTE — Telephone Encounter (Signed)
Chief Complaint: nosebleed Symptoms: nosebleed with blood clot since today at 10:15AM Frequency: 10:15AM Pertinent Negatives: Patient denies CP, difficulty breathing, dizziness  Disposition: [] ED /[] Urgent Care (no appt availability in office) / [] Appointment(In office/virtual)/ []  [facility]/ [x] Home Care/ [] Refused Recommended Disposition /[] [facility]/ []  Follow-up with PCP Additional Notes: Pt reports onset of nosebleed at 1015 today at work. 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 Pt states there is a blood clot lodged in her nose from the bleed and she wants to know what to do about the clot. Pt states she is holding pressure against her nares and leaning back. RN instructed the patient to hold pressure and lean forward instead. Pt held pressure, learned forward, and said the bleeding was lessening. Pt asked what she should do about the blood clot, RN instructed pt to blow her nose to get rid of the clot, which pt did. Bleeding ensued but pt held pressure again and leaned forward. Pt states bleeding has stopped. Then pt said the other nare was starting to bleed but it was minimal and resolved quickly. RN instructed pt to keep holding pressure and lean forward until bleeding has completely stopped. RN advised pt she should call us back if she has another nose bleed since she may need to have them addressed by a HCP. No dizziness or lightheadedness. No thinners. No hx of HTN. No hx of bleeding disorders in the famly. RN advised pt to go to UC if this happens again today, she verbalized understanding.  Copied from CRM [id number]. Topic: Clinical - Red Word Triage >> Feb 02, 2024 10:31 AM Maxwell Marion wrote: [facility] that prompted transfer to Nurse Triage: dark blood coming from the nose and when she holds her head back, the blood comes out of her mouth. started this morning Reason for Disposition  [1] Nosebleed AND [2] bleeding  now BUT [3] not using correct technique  Answer Assessment - Initial Assessment Questions 1. AMOUNT OF BLEEDING: "How bad is the bleeding?" "How much blood was lost?" "Has the bleeding stopped?"   - MILD: needed a couple tissues   - MODERATE: needed many tissues   - SEVERE: large blood clots, soaked many tissues, lasted more than 30 minutes      Blood clots - "I'm just wiping." Bleeding since 1015 AM. Went to the bathroom and tilted head down and it started pouring blood. Second time this happened at this job. Job Futures trader for [employer]  2. ONSET: "When did the nosebleed start?"      Nosebleed 3. FREQUENCY: "How many nosebleeds have you had in the last 24 hours?"      Just the one 4. RECURRENT SYMPTOMS: "Have there been other recent nosebleeds?" If Yes, ask: "How long did it take you to stop the bleeding?" "What worked best?"      "Problem with my arm that they say is eczema, I can't see a dermatologist until April. Hand broke out in the middle of my palm since starting new job. Itching and painful. 5. CAUSE: "What do you think caused this nosebleed?"     Her new job in a warehouse.  6. LOCAL FACTORS: "Do you have any cold symptoms?", "Have you been rubbing or picking at your nose?"     "I was sick maybe last week with flu like symptoms" 7. SYSTEMIC FACTORS: "Do you have high blood pressure or any bleeding problems?"  No 8. BLOOD THINNERS: "Do you take any blood thinners?" (e.g., aspirin, clopidogrel / Plavix, coumadin, heparin). Notes: Other strong blood thinners include: Arixtra (fondaparinux), Eliquis (apixaban), Pradaxa (dabigatran), and Xarelto (rivaroxaban).     No 9. OTHER SYMPTOMS: "Do you have any other symptoms?" (e.g., lightheadedness)     "I feel weak", states weakness started before the nosebleed. Doesn't think they're associated. Standing up in the bathroom. Denies dizziness or lightheadedness, says she has been standing up for a while  Protocols used:  Nosebleed-A-AH

## 2024-02-02 NOTE — Telephone Encounter (Signed)
LVM for patient to return call.

## 2024-02-04 NOTE — Telephone Encounter (Signed)
LVM for patient to return call. I will sen d patient a [facility] message.

## 2024-02-04 NOTE — Telephone Encounter (Signed)
LVM for patient to return call.

## 2024-06-29 ENCOUNTER — Ambulatory Visit: Payer: Medicare HMO | Admitting: Dermatology

## 2024-07-18 ENCOUNTER — Ambulatory Visit: Payer: Self-pay

## 2024-07-18 NOTE — Telephone Encounter (Signed)
 Noted. Patient aware that she needs to schedule an appointment.

## 2024-07-18 NOTE — Telephone Encounter (Signed)
 FYI Only or Action Required?: Action required by provider: request for appointment.  Patient was last seen in primary care on 12/16/2023 by Berneta Elsie Sayre, MD.  Called Nurse Triage reporting Urinary Frequency.  Symptoms began several days ago.  Interventions attempted: Nothing.  Symptoms are: gradually worsening.  Triage Disposition: See Physician Within 24 Hours  Patient/caregiver understands and will follow disposition?: No                             Copied from CRM #[id number]. Topic: Clinical - Red Word Triage >> Jul 18, 2024 12:05 PM Rosina BIRCH wrote: Reason for RMF:lmpwjupwh alot, not feeling well and she does not know if her sugar level is high or low Reason for Disposition  Urinating more frequently than usual (i.e., frequency) OR new-onset of the feeling of an urgent need to urinate (i.e., urgency)  Answer Assessment - Initial Assessment Questions 1. SYMPTOM: What's the main symptom you're concerned about? (e.g., frequency, incontinence)     Frequent urination 2. ONSET: When did the frequency start?     This week 3. PAIN: Is there any pain? If Yes, ask: How bad is it? (Scale: 1-10; mild, moderate, severe)     Denies 4. CAUSE: What do you think is causing the symptoms?     Unsure, maybe related to sugar, states she is a prediabetic 5. OTHER SYMPTOMS: Do you have any other symptoms? 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 Patient stated she just wanted an appointment and did not want to disclose additional symptoms because she was at work. Patient stated she could only come in Friday afternoon. No availability in office Friday afternoon. Advised UC. Patient declined and expressed  that she shouldn't have to go to UC if she has a primary doctor. Please advise.  Protocols used: Urinary Symptoms-A-AH

## 2024-08-29 ENCOUNTER — Encounter: Payer: Self-pay | Admitting: Physician Assistant

## 2024-08-29 ENCOUNTER — Ambulatory Visit (INDEPENDENT_AMBULATORY_CARE_PROVIDER_SITE_OTHER): Admitting: Physician Assistant

## 2024-08-29 VITALS — BP 129/96 | HR 73

## 2024-08-29 DIAGNOSIS — L308 Other specified dermatitis: Secondary | ICD-10-CM | POA: Diagnosis not present

## 2024-08-29 DIAGNOSIS — L309 Dermatitis, unspecified: Secondary | ICD-10-CM

## 2024-08-29 MED ORDER — CLOBETASOL PROPIONATE 0.05 % EX OINT: TOPICAL_OINTMENT | CUTANEOUS | 2 refills | Status: AC

## 2024-08-29 NOTE — Patient Instructions (Signed)

## 2024-08-29 NOTE — Progress Notes (Addendum)
   New Patient Visit   Subjective  Sylvia Thompson is a 38 y.o. female NEW PATIENT who presents for the following: rash  Patient states she has rash located at the hands that she would like to have examined. Patient reports the areas have been there for 6 months. She reports the areas are bothersome pateint reports that her hands are very itch and at one point she had this same rash on her legs but it went away and is only on the hands. Patient rates irritation 10 out of 10. She states that the areas have spread. Patient reports she has previously been treated for these areas patient reports that she was given triamcinolone  cream 0.1% by urgent care and has tried turmeric soap on her own. Patient denies Hx of bx.  The patient has spots, moles and lesions to be evaluated, some may be new or changing and the patient may have concern these could be cancer.   The following portions of the chart were reviewed this encounter and updated as appropriate: medications, allergies, medical history  Review of Systems:  No other skin or systemic complaints except as noted in HPI or Assessment and Plan.  Objective  Well appearing patient in no apparent distress; mood and affect are within normal limits.   A focused examination was performed of the following areas: hands and feet   Relevant exam findings are noted in the Assessment and Plan.            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    HAND ECZEMA    No follow-ups on file.  I, Doyce Pan, CMA, am acting as scribe for Saylor Sheckler K, PA-C.  Documentation: I have reviewed the above documentation for accuracy and completeness, and I agree with the above.  Braylyn Eye K, PA-C

## 2024-09-20 ENCOUNTER — Telehealth: Payer: Self-pay

## 2024-09-20 NOTE — Telephone Encounter (Signed)
 Copied from CRM #[id number]. Topic: Appointments - Scheduling Inquiry for Clinic >> Sep 19, 2024  5:00 PM Sylvia Thompson wrote: Reason for CRM: Patient states she received a call today about an appointment ,I'm not showing anything in notes, patient requests a return call

## 2024-09-20 NOTE — Telephone Encounter (Signed)
 Left message that patient has an annual wellness visit scheduled for 09/22/24 at 10:10am and that this is a phone call and not an in person visit.

## 2024-09-20 NOTE — Telephone Encounter (Signed)
 Patient called back confused on why her appt was virtual and not in person. Per chart appt details visit type was listed as in person. Patient made aware of appointment details and was under the impression she was scheduled for a Physical not an AWV. Confirm with patient type of appointment she was scheduled for an patient was very unhappy and requested 09/22/24 appointment be with Lauren for a physical. Advise patient there was no availability with Lauren for the same date and time of previous appt. Offer next available for Physical and patient declined.

## 2024-09-22 ENCOUNTER — Ambulatory Visit

## 2024-09-27 ENCOUNTER — Ambulatory Visit (INDEPENDENT_AMBULATORY_CARE_PROVIDER_SITE_OTHER): Admitting: Nurse Practitioner

## 2024-09-27 ENCOUNTER — Encounter: Payer: Self-pay | Admitting: Nurse Practitioner

## 2024-09-27 ENCOUNTER — Other Ambulatory Visit (HOSPITAL_COMMUNITY)
Admission: RE | Admit: 2024-09-27 | Discharge: 2024-09-27 | Disposition: A | Discharge status: Home / Self Care | Source: Ambulatory Visit | Attending: Nurse Practitioner | Admitting: Nurse Practitioner

## 2024-09-27 VITALS — BP 124/80 | HR 69 | Temp 97.2°F | Ht 60.0 in | Wt 191.2 lb

## 2024-09-27 DIAGNOSIS — Z6837 Body mass index (BMI) 37.0-37.9, adult: Secondary | ICD-10-CM | POA: Diagnosis not present

## 2024-09-27 DIAGNOSIS — N898 Other specified noninflammatory disorders of vagina: Secondary | ICD-10-CM | POA: Diagnosis not present

## 2024-09-27 DIAGNOSIS — J4520 Mild intermittent asthma, uncomplicated: Secondary | ICD-10-CM

## 2024-09-27 DIAGNOSIS — R7303 Prediabetes: Secondary | ICD-10-CM

## 2024-09-27 DIAGNOSIS — L2082 Flexural eczema: Secondary | ICD-10-CM | POA: Diagnosis not present

## 2024-09-27 DIAGNOSIS — R519 Headache, unspecified: Secondary | ICD-10-CM | POA: Diagnosis not present

## 2024-09-27 DIAGNOSIS — Z0000 Encounter for general adult medical examination without abnormal findings: Secondary | ICD-10-CM

## 2024-09-27 DIAGNOSIS — G8929 Other chronic pain: Secondary | ICD-10-CM | POA: Diagnosis not present

## 2024-09-27 LAB — CBC WITH DIFFERENTIAL/PLATELET
Basophils Absolute: 0 K/uL (ref 0.0–0.1)
Basophils Relative: 0.3 % (ref 0.0–3.0)
Eosinophils Absolute: 0.2 K/uL (ref 0.0–0.7)
Eosinophils Relative: 3.3 % (ref 0.0–5.0)
HCT: 34.2 % — ABNORMAL LOW (ref 36.0–46.0)
Hemoglobin: 11 g/dL — ABNORMAL LOW (ref 12.0–15.0)
Lymphocytes Relative: 25.5 % (ref 12.0–46.0)
Lymphs Abs: 1.9 K/uL (ref 0.7–4.0)
MCHC: 32.2 g/dL (ref 30.0–36.0)
MCV: 84.6 fl (ref 78.0–100.0)
Monocytes Absolute: 0.6 K/uL (ref 0.1–1.0)
Monocytes Relative: 8.8 % (ref 3.0–12.0)
Neutro Abs: 4.6 K/uL (ref 1.4–7.7)
Neutrophils Relative %: 62.1 % (ref 43.0–77.0)
Platelets: 373 K/uL (ref 150.0–400.0)
RBC: 4.04 Mil/uL (ref 3.87–5.11)
RDW: 15.2 % (ref 11.5–15.5)
WBC: 7.3 K/uL (ref 4.0–10.5)

## 2024-09-27 LAB — COMPREHENSIVE METABOLIC PANEL WITH GFR
ALT: 12 U/L (ref 0–35)
AST: 14 U/L (ref 0–37)
Albumin: 4.1 g/dL (ref 3.5–5.2)
Alkaline Phosphatase: 48 U/L (ref 39–117)
BUN: 14 mg/dL (ref 6–23)
CO2: 29 meq/L (ref 19–32)
Calcium: 9.4 mg/dL (ref 8.4–10.5)
Chloride: 102 meq/L (ref 96–112)
Creatinine, Ser: 0.7 mg/dL (ref 0.40–1.20)
GFR: 108.43 mL/min (ref >60.00)
Glucose, Bld: 98 mg/dL (ref 70–99)
Potassium: 3.9 meq/L (ref 3.5–5.1)
Sodium: 137 meq/L (ref 135–145)
Total Bilirubin: 0.2 mg/dL (ref 0.2–1.2)
Total Protein: 7.6 g/dL (ref 6.0–8.3)

## 2024-09-27 LAB — HEMOGLOBIN A1C: Hgb A1c MFr Bld: 6.4 % (ref 4.6–6.5)

## 2024-09-27 MED ORDER — ALBUTEROL SULFATE HFA 108 (90 BASE) MCG/ACT IN AERS: 2.0000 | INHALATION_SPRAY | Freq: Four times a day (QID) | RESPIRATORY_TRACT | 3 refills | Status: AC | PRN

## 2024-09-27 NOTE — Progress Notes (Signed)
 BP 124/80 (BP Location: Left Arm, Patient Position: Sitting, Cuff Size: Large)   Pulse 69   Temp (!) 97.2 F (36.2 C)   Ht 5' (1.524 m)   Wt 191 lb 3.2 oz (86.7 kg)   LMP 09/21/2024 (Approximate)   SpO2 99%   BMI 37.34 kg/m    Subjective:    Patient ID: Sylvia Thompson, female    DOB: [birth year], 38 y.o.   MRN: [id number]  CC: Chief Complaint  Patient presents with   Annual Exam    With non fasting labs, no concerns    HPI: Sylvia Thompson is a 38 y.o. female presenting on 09/27/2024 for comprehensive medical examination. Current medical complaints include:headaches   Headache above left eye intermittently for the last 3 months. Recently, the pain has been there every day. She endorses light sensitivity. Denies nausea. She has an eye appointment in the next week. She has been trying to drink a lot of fluids which helps.   She currently lives with: husband, kids Menopausal Symptoms: no  Depression and Anxiety Screen done today and results listed below:     09/27/2024    1:14 PM 07/27/2023   10:48 AM 04/02/2023    4:16 PM 03/08/2023    2:26 PM 09/28/2022    2:06 PM  Depression screen PHQ 2/9  Decreased Interest 0 0 0 0 0  Down, Depressed, Hopeless 0 0 0 0 0  PHQ - 2 Score 0 0 0 0 0  Altered sleeping 0 0  0 1  Tired, decreased energy 1 0  3 1  Change in appetite 1 0  3 1  Feeling bad or failure about yourself  0 0  0 0  Trouble concentrating 0 0  0 0  Moving slowly or fidgety/restless 0 0  0 0  Suicidal thoughts 0 0  0 0  PHQ-9 Score 2 0  6 3  Difficult doing work/chores Not difficult at all          09/27/2024    1:15 PM 07/27/2023   10:48 AM 03/08/2023    2:27 PM 09/28/2022    2:07 PM  GAD 7 : Generalized Anxiety Score  Nervous, Anxious, on Edge 0 0 0 0  Control/stop worrying 0 0 0 0  Worry too much - different things 0 0 3 0  Trouble relaxing 0 0 3 0  Restless 0 0 0 0  Easily annoyed or irritable 0 0 3 0  Afraid - awful might happen 0 0 0 0  Total GAD 7  Score 0 0 9 0  Anxiety Difficulty Not difficult at all       The patient does not have a history of falls. I did not complete a risk assessment for falls. A plan of care for falls was not documented.   Past Medical History:  Past Medical History:  Diagnosis Date   Anemia    Asthma    Blindness, legal    Cataract    Chlamydia 01/03/2019   GBS (group B Streptococcus carrier), +RV culture, currently pregnant 04/12/2019   Gestational diabetes    2007   Glaucoma    History of gestational diabetes 11/02/2018   HPV (human papilloma virus) infection    Prosthetic eye globe     Surgical History:  Past Surgical History:  Procedure Laterality Date   DILATION AND CURETTAGE OF UTERUS     EYE SURGERY     prosthetic right eye    Medications:  Current Outpatient Medications on File Prior to Visit  Medication Sig   clobetasol  ointment (TEMOVATE ) 0.05 % Apply twice daily to affected areas on hands and discontinue with improvement. (Patient not taking: Reported on 09/27/2024)   triamcinolone  cream (KENALOG ) 0.1 % Apply a thin coat to rash on left forearm twice daily (Patient not taking: Reported on 09/27/2024)   No current facility-administered medications on file prior to visit.    Allergies:  Allergies  Allergen Reactions   Timolol Shortness Of Breath   Latex Rash    Social History:  Social History   Socioeconomic History   Marital status: Married    Spouse name: Not on file   Number of children: Not on file   Years of education: Not on file   Highest education level: Not on file  Occupational History   Not on file  Tobacco Use   Smoking status: Never   Smokeless tobacco: Never  Vaping Use   Vaping status: Never Used  Substance and Sexual Activity   Alcohol use: Not Currently   Drug use: No   Sexual activity: Yes    Partners: Male    Birth control/protection: None  Other Topics Concern   Not on file  Social History Narrative   Not on file   Social Drivers of  Health   Financial Resource Strain: Low Risk  (04/02/2023)   Overall Financial Resource Strain (CARDIA)    Difficulty of Paying Living Expenses: Not hard at all  Food Insecurity: No Food Insecurity (04/02/2023)   Hunger Vital Sign    Worried About Running Out of Food in the Last Year: Never true    Ran Out of Food in the Last Year: Never true  Transportation Needs: No Transportation Needs (04/02/2023)   PRAPARE - Administrator, Civil Service (Medical): No    Lack of Transportation (Non-Medical): No  Physical Activity: Inactive (04/02/2023)   Exercise Vital Sign    Days of Exercise per Week: 0 days    Minutes of Exercise per Session: 0 min  Stress: No Stress Concern Present (04/02/2023)   [employer] of Occupational Health - Occupational Stress Questionnaire    Feeling of Stress : Not at all  Social Connections: Not on file  Intimate Partner Violence: Not At Risk (05/02/2019)   Humiliation, Afraid, Rape, and Kick questionnaire    Fear of Current or Ex-Partner: No    Emotionally Abused: No    Physically Abused: No    Sexually Abused: No   Social History   Tobacco Use  Smoking Status Never  Smokeless Tobacco Never   Social History   Substance and Sexual Activity  Alcohol Use Not Currently    Family History:  Family History  Problem Relation Age of Onset   Diabetes Mother    Arthritis Mother     Past medical history, surgical history, medications, allergies, family history and social history reviewed with patient today and changes made to appropriate areas of the chart.   Review of Systems  Constitutional: Negative.   HENT: Negative.    Eyes: Negative.   Respiratory: Negative.    Cardiovascular: Negative.   Gastrointestinal: Negative.   Genitourinary:  Positive for frequency. Negative for dysuria and urgency.       Vaginal discharge  Musculoskeletal: Negative.   Skin: Negative.   Neurological:  Positive for headaches. Negative for dizziness.   Psychiatric/Behavioral: Negative.     All other ROS negative except what is listed above and in the HPI.  Objective:    BP 124/80 (BP Location: Left Arm, Patient Position: Sitting, Cuff Size: Large)   Pulse 69   Temp (!) 97.2 F (36.2 C)   Ht 5' (1.524 m)   Wt 191 lb 3.2 oz (86.7 kg)   LMP 09/21/2024 (Approximate)   SpO2 99%   BMI 37.34 kg/m   Wt Readings from Last 3 Encounters:  09/27/24 191 lb 3.2 oz (86.7 kg)  07/27/23 177 lb (80.3 kg)  04/05/23 174 lb 3.2 oz (79 kg)    Physical Exam Vitals and nursing note reviewed.  Constitutional:      General: She is not in acute distress.    Appearance: Normal appearance.  HENT:     Head: Normocephalic and atraumatic.     Right Ear: Tympanic membrane, ear canal and external ear normal.     Left Ear: Tympanic membrane, ear canal and external ear normal.     Mouth/Throat:     Mouth: Mucous membranes are moist.     Pharynx: No posterior oropharyngeal erythema.  Eyes:     Conjunctiva/sclera: Conjunctivae normal.  Cardiovascular:     Rate and Rhythm: Normal rate and regular rhythm.     Pulses: Normal pulses.     Heart sounds: Normal heart sounds.  Pulmonary:     Effort: Pulmonary effort is normal.     Breath sounds: Normal breath sounds.  Abdominal:     Palpations: Abdomen is soft.     Tenderness: There is no abdominal tenderness.  Musculoskeletal:        General: Normal range of motion.     Cervical back: Normal range of motion and neck supple.     Right lower leg: No edema.     Left lower leg: No edema.  Lymphadenopathy:     Cervical: No cervical adenopathy.  Skin:    General: Skin is warm and dry.  Neurological:     General: No focal deficit present.     Mental Status: She is alert and oriented to person, place, and time.     Cranial Nerves: No cranial nerve deficit.     Coordination: Coordination normal.     Gait: Gait normal.  Psychiatric:        Mood and Affect: Mood normal.        Behavior: Behavior  normal.        Thought Content: Thought content normal.        Judgment: Judgment normal.     Results for orders placed or performed in visit on 12/16/23  POCT Urinalysis Dipstick   Collection Time: 12/16/23 11:24 AM  Result Value Ref Range   Color, UA yellow    Clarity, UA clear    Glucose, UA Negative Negative   Bilirubin, UA neg    Ketones, UA neg    Spec Grav, UA 1.015 1.010 - 1.025   Blood, UA 3+    pH, UA 6.0 5.0 - 8.0   Protein, UA Negative Negative   Urobilinogen, UA 0.2 0.2 or 1.0 E.U./dL   Nitrite, UA positive    Leukocytes, UA Small (1+) (A) Negative   Appearance     Odor    Urine Culture   Collection Time: 12/16/23 11:45 AM   Specimen: Urine  Result Value Ref Range   MICRO NUMBER: [id number]    SPECIMEN QUALITY: Adequate    Sample Source URINE    STATUS: FINAL    ISOLATE 1: Escherichia coli (A)       Susceptibility  Escherichia coli - URINE CULTURE, REFLEX    AMOX/CLAVULANIC <=2 Sensitive     AMPICILLIN  <=2 Sensitive     AMPICILLIN /SULBACTAM <=2 Sensitive     CEFAZOLIN* <=4 Not Reportable      * For infections other than uncomplicated UTI caused by E. coli, K. pneumoniae or P. mirabilis: Cefazolin is resistant if MIC > or = 8 mcg/mL. (Distinguishing susceptible versus intermediate for isolates with MIC < or = 4 mcg/mL requires additional testing.) For uncomplicated UTI caused by E. coli, K. pneumoniae or P. mirabilis: Cefazolin is susceptible if MIC <32 mcg/mL and predicts susceptible to the oral agents cefaclor, cefdinir, cefpodoxime, cefprozil, cefuroxime, cephalexin  and loracarbef.     CEFTAZIDIME <=1 Sensitive     CEFEPIME <=1 Sensitive     CEFTRIAXONE <=1 Sensitive     CIPROFLOXACIN <=0.25 Sensitive     LEVOFLOXACIN <=0.12 Sensitive     GENTAMICIN <=1 Sensitive     IMIPENEM <=0.25 Sensitive     NITROFURANTOIN  <=16 Sensitive     PIP/TAZO <=4 Sensitive     TOBRAMYCIN <=1 Sensitive     TRIMETH/SULFA* <=20 Sensitive      * For infections  other than uncomplicated UTI caused by E. coli, K. pneumoniae or P. mirabilis: Cefazolin is resistant if MIC > or = 8 mcg/mL. (Distinguishing susceptible versus intermediate for isolates with MIC < or = 4 mcg/mL requires additional testing.) For uncomplicated UTI caused by E. coli, K. pneumoniae or P. mirabilis: Cefazolin is susceptible if MIC <32 mcg/mL and predicts susceptible to the oral agents cefaclor, cefdinir, cefpodoxime, cefprozil, cefuroxime, cephalexin  and loracarbef. Legend: S = Susceptible  I = Intermediate R = Resistant  NS = Not susceptible SDD = Susceptible Dose Dependent * = Not Tested  NR = Not Reported **NN = See Therapy Comments       Assessment & Plan:   Problem List Items Addressed This Visit       Respiratory   Mild intermittent asthma without complication   Chronic, stable. Continue albuterol  inhaler every 6 hours as needed for shortness of breath or wheezing. She declines pneumonia vaccine.       Relevant Medications   albuterol  (PROAIR  HFA) 108 (90 Base) MCG/ACT inhaler     Musculoskeletal and Integument   Flexural eczema   Chronic, stable. Symptoms have resolved. Continue not wearing latex gloves.         Other   Prediabetes   Chronic, stable. Check A1c, CMP, CBC and treat based on results.       Relevant Orders   CBC with Differential/Platelet   Comprehensive metabolic panel with GFR   Hemoglobin A1c   Routine general medical examination at a health care facility - Primary   Health maintenance reviewed and updated. Discussed nutrition, exercise. Follow-up 1 year.        Morbid obesity (HCC)   BMI 37.3 associated with prediabetes. Discussed nutrition, exercise.       Other Visit Diagnoses       Chronic nonintractable headache, unspecified headache type       Conitnue drinking plenty of fluids. Can start magnesium supplement at bedtime. Can take ibuprofen  or tylenol  prn (try to limit 2-3x per week)     Vaginal discharge        Check swab for BV, yeast, gonorrhea, and chlamydia today   Relevant Orders   Cervicovaginal ancillary only        Follow up plan: Return in about 1 year (around 09/27/2025) for CPE.  LABORATORY TESTING:  - Pap smear: up to date  IMMUNIZATIONS:   - Tdap: Tetanus vaccination status reviewed: last tetanus booster within 10 years. - Influenza: Declined - Pneumovax: Declined - Prevnar: Declined - HPV: Not applicable - Shingrix vaccine: Not applicable  SCREENING: -Mammogram: Ordered today  - Colonoscopy: Not applicable  - Bone Density: Not applicable   PATIENT COUNSELING:   Advised to take 1 mg of folate supplement per day if capable of pregnancy.   Sexuality: Discussed sexually transmitted diseases, partner selection, use of condoms, avoidance of unintended pregnancy  and contraceptive alternatives.   Advised to avoid cigarette smoking.  I discussed with the patient that most people either abstain from alcohol or drink within safe limits (<=14/week and <=4 drinks/occasion for males, <=7/weeks and <= 3 drinks/occasion for females) and that the risk for alcohol disorders and other health effects rises proportionally with the number of drinks per week and how often a drinker exceeds daily limits.  Discussed cessation/primary prevention of drug use and availability of treatment for abuse.   Diet: Encouraged to adjust caloric intake to maintain  or achieve ideal body weight, to reduce intake of dietary saturated fat and total fat, to limit sodium intake by avoiding high sodium foods and not adding table salt, and to maintain adequate dietary potassium and calcium preferably from fresh fruits, vegetables, and low-fat dairy products.    stressed the importance of regular exercise  Injury prevention: Discussed safety belts, safety helmets, smoke detector, smoking near bedding or upholstery.   Dental health: Discussed importance of regular tooth brushing, flossing, and dental visits.     NEXT PREVENTATIVE PHYSICAL DUE IN 1 YEAR. Return in about 1 year (around 09/27/2025) for CPE.  Caridad Silveira A Lillion Elbert

## 2024-09-27 NOTE — Assessment & Plan Note (Signed)
 Health maintenance reviewed and updated. Discussed nutrition, exercise. Follow-up 1 year.

## 2024-09-27 NOTE — Assessment & Plan Note (Signed)
 BMI 37.3 associated with prediabetes. Discussed nutrition, exercise.

## 2024-09-27 NOTE — Assessment & Plan Note (Signed)
 Chronic, stable. Check A1c, CMP, CBC and treat based on results.

## 2024-09-27 NOTE — Assessment & Plan Note (Signed)
 Chronic, stable. Symptoms have resolved. Continue not wearing latex gloves.

## 2024-09-27 NOTE — Assessment & Plan Note (Signed)
 Chronic, stable. Continue albuterol  inhaler every 6 hours as needed for shortness of breath or wheezing. She declines pneumonia vaccine.

## 2024-09-27 NOTE — Patient Instructions (Signed)
 It was great to see you!  We are checking your labs today and will let you know the results via mychart/phone.   I have placed an order for screening mammogram, they should call you to schedule. If you do not hear from them in the next week, please call:  [facility] [address] [place], [address] [place], [place] [phone]  Start a magnesium supplement (glycinate or oxide) 200-250mg  at bedtime to help with the headaches  Keep drinking plenty of fluids and you can take ibuprofen  or tylenol  as needed (try to limit this to 2-3 times per week)   Let's follow-up in 1 year, sooner if you have concerns.  If a referral was placed today, you will be contacted for an appointment. Please note that routine referrals can sometimes take up to 3-4 weeks to process. Please call our office if you haven't heard anything after this time frame.  Take care,  Tinnie Harada, NP

## 2024-09-28 ENCOUNTER — Ambulatory Visit: Payer: Self-pay | Admitting: Nurse Practitioner

## 2024-09-28 LAB — CERVICOVAGINAL ANCILLARY ONLY
Bacterial Vaginitis (gardnerella): NEGATIVE
Candida Glabrata: NEGATIVE
Candida Vaginitis: POSITIVE — AB
Chlamydia: NEGATIVE
Comment: NEGATIVE
Comment: NEGATIVE
Comment: NEGATIVE
Comment: NEGATIVE
Comment: NEGATIVE
Comment: NORMAL
Neisseria Gonorrhea: NEGATIVE
Trichomonas: NEGATIVE

## 2024-09-28 MED ORDER — FLUCONAZOLE 150 MG PO TABS: ORAL_TABLET | ORAL | 0 refills | Status: DC

## 2024-10-04 DIAGNOSIS — H2512 Age-related nuclear cataract, left eye: Secondary | ICD-10-CM | POA: Diagnosis not present

## 2024-10-04 DIAGNOSIS — H409 Unspecified glaucoma: Secondary | ICD-10-CM | POA: Diagnosis not present

## 2024-10-04 DIAGNOSIS — H3552 Pigmentary retinal dystrophy: Secondary | ICD-10-CM | POA: Diagnosis not present

## 2024-12-20 ENCOUNTER — Other Ambulatory Visit: Payer: Self-pay | Admitting: Nurse Practitioner

## 2024-12-20 DIAGNOSIS — Z1231 Encounter for screening mammogram for malignant neoplasm of breast: Secondary | ICD-10-CM

## 2025-01-05 NOTE — Progress Notes (Signed)
" ° °  Acute Visit  There were no vitals taken for this visit.   Subjective:    Patient ID: Sylvia Thompson, female    DOB: 1986-03-07, 39 y.o.   MRN: [id number]  CC: No chief complaint on file.   HPI: Sylvia Thompson is a 39 y.o. female presents to discuss a podiatry referral.    Past Medical History:  Diagnosis Date   Anemia    Asthma    Blindness, legal    Cataract    Chlamydia 01/03/2019   GBS (group B Streptococcus carrier), +RV culture, currently pregnant 04/12/2019   Gestational diabetes    2007   Glaucoma    History of gestational diabetes 11/02/2018   HPV (human papilloma virus) infection    Prosthetic eye globe     Past Surgical History:  Procedure Laterality Date   DILATION AND CURETTAGE OF UTERUS     EYE SURGERY     prosthetic right eye    Family History  Problem Relation Age of Onset   Diabetes Mother    Arthritis Mother      Social History[1]  Medications Ordered Prior to Encounter[2]   Review of Systems      Objective:    There were no vitals taken for this visit.  Wt Readings from Last 3 Encounters:  09/27/24 191 lb 3.2 oz (86.7 kg)  07/27/23 177 lb (80.3 kg)  04/05/23 174 lb 3.2 oz (79 kg)    BP Readings from Last 3 Encounters:  09/27/24 124/80  08/29/24 (!) 129/96  01/06/24 123/82    Physical Exam     Assessment & Plan:   Problem List Items Addressed This Visit   None    Follow up plan: No follow-ups on file.  Tinnie DELENA Harada, NP  I,Emily Lagle,acting as a scribe for [employer], NP.,have documented all relevant documentation on the behalf of [name] DELENA Harada, NP.  I, Tinnie DELENA Harada, NP, have reviewed all documentation for this visit. The documentation on 01/08/2025 for the exam, diagnosis, procedures, and orders are all accurate and complete.    [1]  Social History Tobacco Use   Smoking status: Never   Smokeless tobacco: Never  Vaping Use   Vaping status: Never Used  Substance Use Topics   Alcohol  use: Not Currently   Drug use: No  [2]  Current Outpatient Medications on File Prior to Visit  Medication Sig Dispense Refill   albuterol  (PROAIR  HFA) 108 (90 Base) MCG/ACT inhaler Inhale 2 puffs into the lungs every 6 (six) hours as needed for wheezing or shortness of breath. 18 g 3   clobetasol  ointment (TEMOVATE ) 0.05 % Apply twice daily to affected areas on hands and discontinue with improvement. (Patient not taking: Reported on 09/27/2024) 30 g 2   fluconazole  (DIFLUCAN ) 150 MG tablet Take 1 tablet today and a second dose in 3 days. 2 tablet 0   triamcinolone  cream (KENALOG ) 0.1 % Apply a thin coat to rash on left forearm twice daily (Patient not taking: Reported on 09/27/2024) 30 g 0   No current facility-administered medications on file prior to visit.   "

## 2025-01-08 ENCOUNTER — Encounter: Payer: Self-pay | Admitting: Nurse Practitioner

## 2025-01-08 ENCOUNTER — Ambulatory Visit: Admitting: Nurse Practitioner

## 2025-01-08 VITALS — BP 122/70 | HR 78 | Temp 97.3°F | Ht 60.0 in | Wt 185.2 lb

## 2025-01-08 DIAGNOSIS — M79672 Pain in left foot: Secondary | ICD-10-CM | POA: Diagnosis not present

## 2025-01-08 NOTE — Assessment & Plan Note (Signed)
 Acute pain worsens with standing, likely due to tendinitis, bone spur, or inflammation, and unlikely diabetic neuropathy. Referred to a podiatrist for evaluation and management. Advised to use heat or ice for pain relief and provided a work note for rest and recovery. Instructed to elevate feet when sitting. Can also start tylenol  or ibuprofen  as needed for pain.

## 2025-01-08 NOTE — Patient Instructions (Signed)
 It was great to see you!  I have placed a referral to a podiatrist   You can heat or ice as needed   Let's follow-up if symptoms worsen or don't improve   Take care,  Tinnie Harada, NP

## 2025-01-18 ENCOUNTER — Ambulatory Visit (INDEPENDENT_AMBULATORY_CARE_PROVIDER_SITE_OTHER)

## 2025-01-18 ENCOUNTER — Ambulatory Visit

## 2025-01-18 DIAGNOSIS — M722 Plantar fascial fibromatosis: Secondary | ICD-10-CM | POA: Diagnosis not present

## 2025-01-18 MED ORDER — METHYLPREDNISOLONE 4 MG PO TBPK: ORAL_TABLET | ORAL | 0 refills | Status: AC

## 2025-01-18 NOTE — Progress Notes (Signed)
 "  Subjective:  Patient ID: Sylvia Thompson, female    DOB: Apr 23, 1986,  MRN: [id number]  Chief Complaint  Patient presents with   Plantar Fasciitis    Rm7 Patient complains of pain left foot heel pain for 3 months/ hurts with pressure and shoe gear/ no treatment     39 y.o. female presents with the above complaint.  She has had pain to her plantar left heel for approximately 3 months.  She denies pain to her Achilles tendon or to other parts of her foot.  She notes that stiffer shoes have helped her pain.  Review of Systems: Negative except as noted in the HPI. Denies N/V/F/Ch.  Past Medical History:  Diagnosis Date   Anemia    Asthma    Blindness, legal    Cataract    Chlamydia 01/03/2019   GBS (group B Streptococcus carrier), +RV culture, currently pregnant 04/12/2019   Gestational diabetes    2007   Glaucoma    History of gestational diabetes 11/02/2018   HPV (human papilloma virus) infection    Prosthetic eye globe    Current Medications[1]  Tobacco Use History[2]  Allergies[3] Objective:  There were no vitals filed for this visit. There is no height or weight on file to calculate BMI. Constitutional Well developed. Well nourished. Oriented to person, place, and time.  Vascular Dorsalis pedis pulses palpable bilaterally. Posterior tibial pulses palpable bilaterally. Capillary refill normal to all digits.  No cyanosis or clubbing noted. Pedal hair growth normal.  Neurologic Normal speech. Epicritic sensation to light touch grossly present bilaterally. Negative tinel sign at tarsal tunnel bilaterally.   Dermatologic Skin texture and turgor are within normal limits.  No open wounds. No skin lesions.  Musculoskeletal: Normal HF and 1st MTP ROM without pain or crepitus bilaterally. Rectus footshape. Tender to palpation at the calcaneal tuber left. No pain with calcaneal squeeze left. Ankle ROM diminished range of motion left. Silfverskiold Test: positive left.    Radiographs: Taken and reviewed. No acute fractures or dislocations. No evidence of stress fracture.  Plantar heel spur absent. Posterior heel spur absent.  Rectus foot shape.  Joint spaces diffusely well-maintained.  Assessment:   1. Plantar fasciitis, left    Plan:  Patient was evaluated and treated and all questions answered.  Plantar Fasciitis, left - XR reviewed as above.  - Educated on icing and stretching. Instructions given.  Stretching instructions sent via MyChart message - Discussed supportive shoegear and use of OTC insoles. Discussed that if insoles are effective, patient would benefit from custom molded insoles to better match foot shape.  -Dispensed over-the-counter insoles today to patient.  Discussed use. - Discussed use of local injection to decrease inflammation.  Patient would like to avoid injection today.  Decision made to proceed with Medrol  Dosepak x 6 days.  This was sent to her pharmacy. - Return to clinic 3 to 4 weeks   Return in about 4 weeks (around 02/15/2025).  Prentice Ovens, DPM AACFAS Fellowship Trained Podiatric Surgeon [facility]      [1]  Current Outpatient Medications:    albuterol  (PROAIR  HFA) 108 (90 Base) MCG/ACT inhaler, Inhale 2 puffs into the lungs every 6 (six) hours as needed for wheezing or shortness of breath., Disp: 18 g, Rfl: 3   clobetasol  ointment (TEMOVATE ) 0.05 %, Apply twice daily to affected areas on hands and discontinue with improvement. (Patient not taking: Reported on 01/18/2025), Disp: 30 g, Rfl: 2   triamcinolone  cream (KENALOG )  0.1 %, Apply a thin coat to rash on left forearm twice daily (Patient not taking: Reported on 01/18/2025), Disp: 30 g, Rfl: 0 [2]  Social History Tobacco Use  Smoking Status Never  Smokeless Tobacco Never  [3]  Allergies Allergen Reactions   Timolol Shortness Of Breath   Latex Rash   "

## 2025-01-24 ENCOUNTER — Ambulatory Visit

## 2025-02-08 ENCOUNTER — Ambulatory Visit

## 2025-02-20 ENCOUNTER — Ambulatory Visit

## 2025-04-11 ENCOUNTER — Ambulatory Visit: Admitting: Nurse Practitioner
